# Patient Record
Sex: Female | Born: 1935 | Race: White | Hispanic: No | Smoking: Former smoker
Health system: Southern US, Community
[De-identification: ages and names within clinical notes are randomized; demographics above are authoritative.]

## PROBLEM LIST (undated history)

## (undated) DIAGNOSIS — I619 Nontraumatic intracerebral hemorrhage, unspecified: Secondary | ICD-10-CM

## (undated) DIAGNOSIS — R569 Unspecified convulsions: Secondary | ICD-10-CM

## (undated) HISTORY — PX: VAGINAL HYSTERECTOMY: SHX2639

## (undated) HISTORY — DX: Unspecified convulsions: R56.9

## (undated) HISTORY — DX: Nontraumatic intracerebral hemorrhage, unspecified: I61.9

---

## 2017-11-01 ENCOUNTER — Encounter: Payer: Self-pay | Admitting: Neurology

## 2017-11-01 ENCOUNTER — Telehealth: Payer: Self-pay | Admitting: Neurology

## 2017-11-01 ENCOUNTER — Ambulatory Visit (INDEPENDENT_AMBULATORY_CARE_PROVIDER_SITE_OTHER): Payer: Medicare Other | Admitting: Neurology

## 2017-11-01 VITALS — BP 105/70 | HR 71 | Ht 62.0 in | Wt 101.5 lb

## 2017-11-01 DIAGNOSIS — I611 Nontraumatic intracerebral hemorrhage in hemisphere, cortical: Secondary | ICD-10-CM

## 2017-11-01 DIAGNOSIS — R569 Unspecified convulsions: Secondary | ICD-10-CM | POA: Diagnosis not present

## 2017-11-01 DIAGNOSIS — I679 Cerebrovascular disease, unspecified: Secondary | ICD-10-CM | POA: Diagnosis not present

## 2017-11-01 DIAGNOSIS — I619 Nontraumatic intracerebral hemorrhage, unspecified: Secondary | ICD-10-CM

## 2017-11-01 HISTORY — DX: Unspecified convulsions: R56.9

## 2017-11-01 HISTORY — DX: Nontraumatic intracerebral hemorrhage, unspecified: I61.9

## 2017-11-01 MED ORDER — DONEPEZIL HCL 5 MG PO TABS: 5.0000 mg | ORAL_TABLET | Freq: Every day | ORAL | 1 refills | Status: DC

## 2017-11-01 NOTE — Telephone Encounter (Signed)
A prescription for Aricept was written.  [facility] be [facility].

## 2017-11-01 NOTE — Progress Notes (Signed)
Reason for visit: Cerebrovascular disease, seizure  Referring physician: Dr. Benson Norway is a 84 y.o. female  History of present illness:  Ann Johns is an 84 year old right-handed white female with a history of a right frontal intracranial hemorrhage that was spontaneous in nature on 22 July 2017.  The patient presented to the emergency room after 2 seizure events, the episodes were associated with staring off, and unresponsiveness.  The patient lived in [place], [place] at the time, she went to the emergency room and was found to have the intracranial hemorrhage but also found to have extensive small vessel ischemic changes that were chronic in nature.  The pattern of intracranial hemorrhage was felt to be consistent with amyloid angiopathy, the patient was not on aspirin prior to the hospitalization.  The patient has been placed on Keppra, she has not had any further seizure events.  She had reported some memory problems for about a year and a half prior to the hospitalization, since the intracranial hemorrhage she has had some reports of balance issues, she continues to have some memory problems, she is now in an assisted living facility, she is in [place] with her family.  She still is having daily headaches since the hospitalization, she takes Tylenol for this.  The patient denies any vision changes, she recently has developed some dysphonia, she will be seen by ENT in the near future.  The patient reports no problem with swallowing or choking.  She has no numbness or weakness of the extremities.  She does have a history of tremors, she takes propranolol for this.  She comes to this office for an evaluation.  She has never had a history of hypertension in the past.  No past medical history on file.  Past Surgical History:  Procedure Laterality Date  . VAGINAL HYSTERECTOMY      No family history on file.  Social history:  reports that she has quit smoking. She has never  used smokeless tobacco. She reports that she drank alcohol. She reports that she does not use drugs.  Medications:  Prior to Admission medications   Medication Sig Start Date End Date Taking? Authorizing Provider  Cholecalciferol (VITAMIN D3 PO) Take 5,000 Units by mouth daily.   Yes [provider]  Cyanocobalamin (VITAMIN B-12 PO) Take 1,000 mcg by mouth daily.   Yes [provider]  FOLIC ACID PO Take 1 Dose by mouth daily.   Yes [provider]  levETIRAcetam (KEPPRA) 500 MG tablet Take 500 mg by mouth 2 (two) times daily.   Yes [provider]  Multiple Vitamins-Minerals (CENTRUM PO) Take 1 tablet by mouth daily.   Yes [provider]  propranolol (INDERAL) 20 MG tablet Take 20 mg by mouth daily.   Yes [provider]      Allergies  Allergen Reactions  . Sulfa Antibiotics Rash    ROS:  Out of a complete 14 system review of symptoms, the patient complains only of the following symptoms, and all other reviewed systems are negative.  Confusion, memory problems  Blood pressure 105/70, pulse 71, height 5\' 2"  (1.575 m), weight 101 lb 8 oz (46 kg).  Physical Exam  General: The patient is alert and cooperative at the time of the examination.  Eyes: Pupils are equal, round, and reactive to light. Discs are flat bilaterally.  Neck: The neck is supple, no carotid bruits are noted.  Respiratory: The respiratory examination is clear.  Cardiovascular: The cardiovascular  examination reveals a regular rate and rhythm, no obvious murmurs or rubs are noted.  Skin: Extremities are without significant edema.  Neurologic Exam  Mental status: The patient is alert and oriented x 3 at the time of the examination. The patient has apparent normal recent and remote memory, with an apparently normal attention span and concentration ability.  Cranial nerves: Facial symmetry is present. There is good sensation of the face to pinprick and soft  touch bilaterally. The strength of the facial muscles and the muscles to head turning and shoulder shrug are normal bilaterally. Speech is well enunciated, no aphasia or dysarthria is noted. Extraocular movements are full. Visual fields are full. The tongue is midline, and the patient has symmetric elevation of the soft palate. No obvious hearing deficits are noted.  Motor: The motor testing reveals 5 over 5 strength of all 4 extremities. Good symmetric motor tone is noted throughout.  Sensory: Sensory testing is intact to pinprick, soft touch, vibration sensation, and position sense on all 4 extremities. No evidence of extinction is noted.  Coordination: Cerebellar testing reveals good finger-nose-finger and heel-to-shin bilaterally.  Gait and station: Gait is minimally wide-based, the patient can walk independently but usually uses a walker.. Tandem gait is unsteady. Romberg is negative. No drift is seen.  Reflexes: Deep tendon reflexes are symmetric and normal bilaterally. Toes are downgoing bilaterally.   Assessment/Plan:  1.  Reported memory disturbance  2.  History of right frontal intracranial hemorrhage  3.  Extensive small vessel disease by MRI brain  4.  Mild gait disorder  5.  Seizures following intracranial hemorrhage  The disc of the MRI of the brain was brought for my review, this does show extensive white matter disease that likely is severe enough to result in some problems with balance and memory.  The patient has been taken off of the Aricept because of a runny nose, but she has not noted any change in her runny nose off the medication.  She may restart the medication if desired.  The patient will undergo a carotid Doppler study and 2D echocardiogram, the source of the extensive white matter changes is not clear.  Prior MRA of the head was normal.  The patient will follow-up in 6 months.  In the future, she may be able to come off of her Keppra as the seizures occurred in  the hyper-acute period following the intracranial hemorrhage.  The patient had the original MRI of the brain on 22 July 2017, she had a repeat study of about 1 month later.  Ann Johns. Keith Emmaline Wahba MD 11/01/2017 2:27 PM  [facility] [address] [place], [place] [id number]  Phone [phone] Fax [phone]

## 2017-11-01 NOTE — Addendum Note (Signed)
Addended by: York SpanielWILLIS, CHARLES K on: 11/01/2017 04:44 PM   Modules accepted: Orders

## 2017-11-01 NOTE — Telephone Encounter (Signed)
Patient's daughter-in-law Ann Johns requesting new Rx for Aricept [facility] fax[facility] at [phone]. Patient was seen today by Dr. Anne HahnWillis. A returned call is not needed.

## 2017-11-10 ENCOUNTER — Other Ambulatory Visit: Payer: Self-pay | Admitting: Otolaryngology

## 2017-11-10 DIAGNOSIS — J3801 Paralysis of vocal cords and larynx, unilateral: Secondary | ICD-10-CM

## 2017-11-12 ENCOUNTER — Other Ambulatory Visit: Payer: Self-pay

## 2017-11-12 ENCOUNTER — Telehealth: Payer: Self-pay | Admitting: Neurology

## 2017-11-12 ENCOUNTER — Ambulatory Visit (HOSPITAL_COMMUNITY): Payer: Medicare Other | Attending: Internal Medicine

## 2017-11-12 DIAGNOSIS — I081 Rheumatic disorders of both mitral and tricuspid valves: Secondary | ICD-10-CM | POA: Insufficient documentation

## 2017-11-12 DIAGNOSIS — I119 Hypertensive heart disease without heart failure: Secondary | ICD-10-CM | POA: Diagnosis not present

## 2017-11-12 DIAGNOSIS — I679 Cerebrovascular disease, unspecified: Secondary | ICD-10-CM

## 2017-11-12 DIAGNOSIS — I639 Cerebral infarction, unspecified: Secondary | ICD-10-CM | POA: Diagnosis present

## 2017-11-12 NOTE — Telephone Encounter (Signed)
I called the patient, talk with the daughter.  The patient has a normal ejection fraction of the heart, mild mitral regurgitation, some septal hypertrophy but no left ventricular outlet obstruction.  The carotid Doppler study is pending.    2D echo 11/12/17:  Study Conclusions  - Left ventricle: The cavity size was normal. There was severe focal basal hypertrophy of the septum. No LVOT obstruction. Systolic function was normal. The estimated ejection fraction was in the range of 55% to 60%. Doppler parameters are consistent with abnormal left ventricular relaxation (grade 1 diastolic dysfunction). The E/e&' ratio is bewteen 8-15, suggesting indeterminate LV filling pressure. - Mitral valve: Mildly thickened leaflets . There was mild regurgitation. - Left atrium: The atrium was normal in size. - Tricuspid valve: There was mild regurgitation. - Pulmonary arteries: PA peak pressure: 26 mm Hg (S) + RAP. - Systemic veins: Not visualized.  Impressions:  - LVEF 55-60%, severe focal basal septal hypertrophy without LVOT obstruction, normal wall motion, grade 1 DD, indeterminate LV filling pressure, mild MR, normal LA size, mild TR, RVSP 26 mmHg + RAP, IVC was not visualized.

## 2017-11-18 ENCOUNTER — Ambulatory Visit
Admission: RE | Admit: 2017-11-18 | Discharge: 2017-11-18 | Disposition: A | Discharge status: Home / Self Care | Payer: Medicare Other | Source: Ambulatory Visit | Attending: Otolaryngology | Admitting: Otolaryngology

## 2017-11-18 DIAGNOSIS — J3801 Paralysis of vocal cords and larynx, unilateral: Secondary | ICD-10-CM

## 2017-11-18 IMAGING — CT CT NECK W/ CM
2 of 4 series · 6 of 14 positions shown, 7 images · IV contrast (iopamidol)
Comparison: None.

CLINICAL DATA: 84-year-old female with paralysis of the left vocal
cord. Patient reports some improvement in her voice recently.
History of skin cancer.

Creatinine was obtained on site at [HOSPITAL] at [HOSPITAL].
Results: Creatinine 0.3 mg/dL.
EXAM:
CT NECK WITH CONTRAST
TECHNIQUE: Multidetector CT imaging of the neck was performed using the
standard protocol following the bolus administration of intravenous
contrast.
CONTRAST:  75mL [SP] IOPAMIDOL ([SP]) INJECTION 61%

[Series 3: neck · axial · 0.44mm/px · z∈[-255,-127]mm · 3 of 130 slices shown]
[im 33/130  bone]
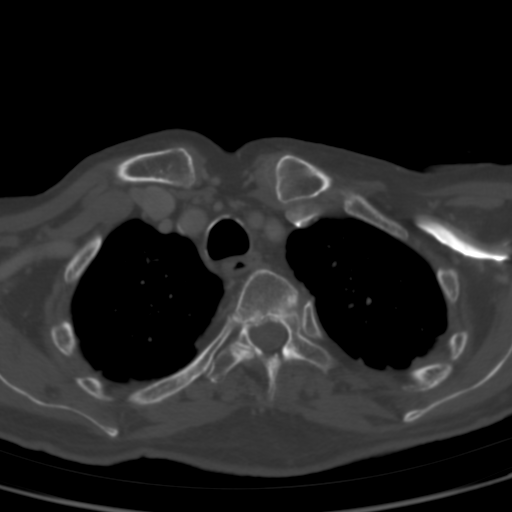
[im 65/130  bone]
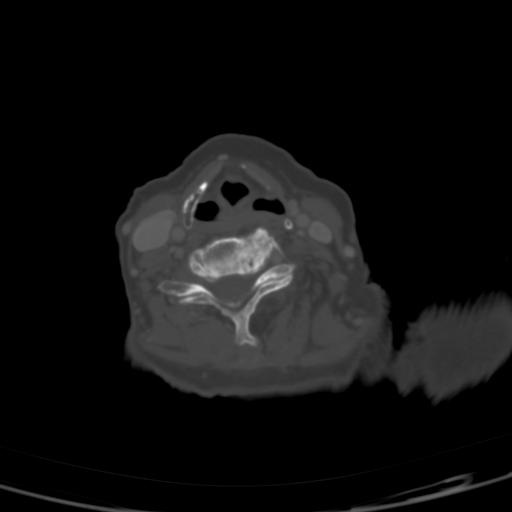
[im 97/130  bone]
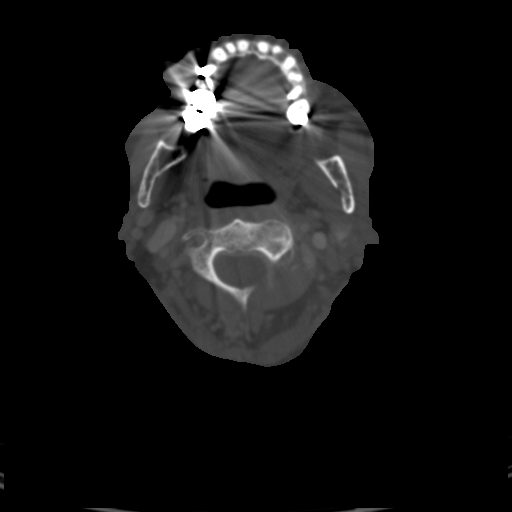

[Series 8: angled axial-oropharynx · axial · 0.39mm/px · z∈[-271,-145]mm · 3 of 130 slices shown, 4 images]
[im 33/130  soft-tissue]
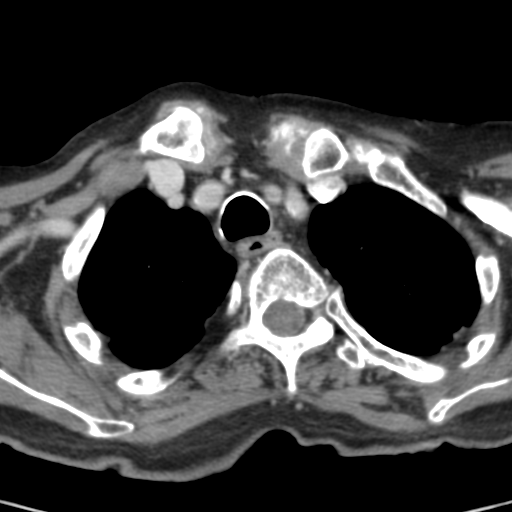
[im 33/130  bone]
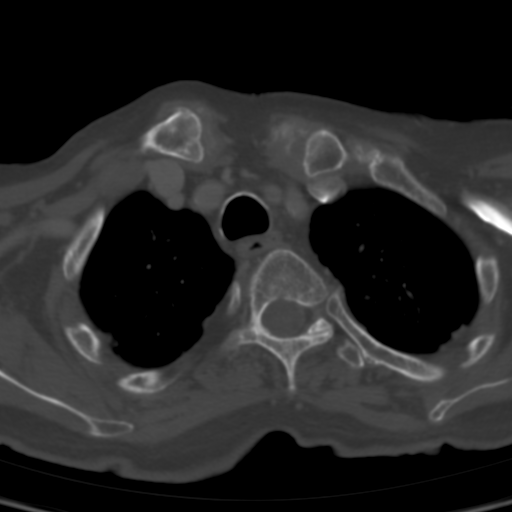
[im 65/130  bone]
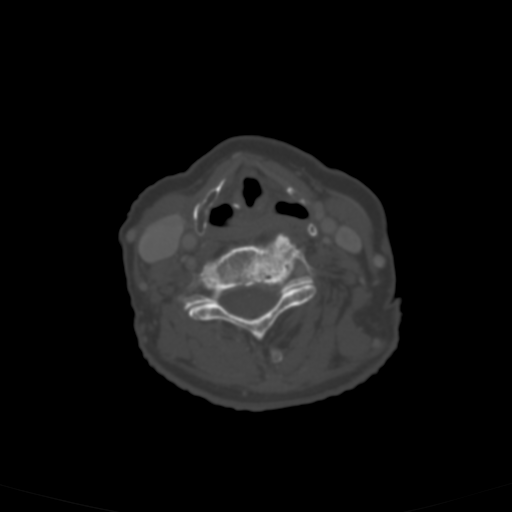
[im 97/130  bone]
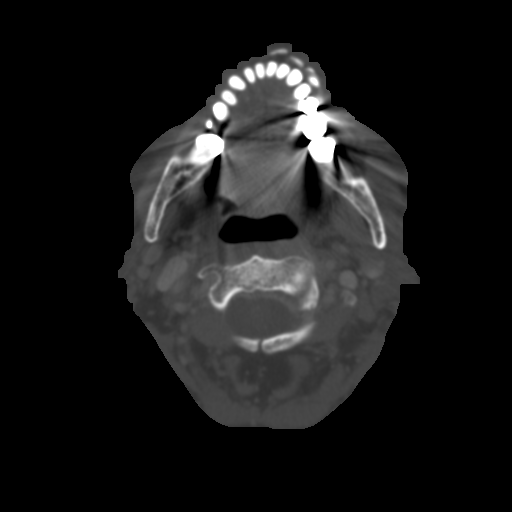

[6 of 14 positions shown; findings below may reference images not displayed]

FINDINGS: Pharynx and larynx: There is mild asymmetry of the larynx including
medial deviation of the left vocal fold and slight asymmetric
enlargement of the left laryngeal ventricle (series 3, image 73).
The piriform sinuses are symmetric. The supraglottic larynx appears
normal. No abnormal enhancement or laryngeal mass is identified.

Pharyngeal soft tissue contours are normal. Negative parapharyngeal
and retropharyngeal spaces.

Salivary glands: Negative sublingual space. The submandibular glands
appear symmetric and within normal limits, both superior
submandibular poles are mildly lobulated. The parotid glands appear
symmetric and within normal limits.

Thyroid: Diminutive, negative.

Lymph nodes: No cervical lymphadenopathy. The largest lymph node is
at the left level II a station measuring 5-6 millimeter short axis
on series 3, image 45. No heterogeneous or cystic lymph nodes.

Vascular: The major vascular structures in the neck and at the skull
base are patent. The left carotid space is negative aside from bulky
calcified plaque of the proximal left ICA. Still, stenosis there may
not be hemodynamically significant. Similar but less pronounced
contralateral right ICA origin calcified plaque. Bilateral ICA
siphon calcified plaque.

Limited intracranial: Negative visualized brain parenchyma.

Visualized orbits: Negative.

Mastoids and visualized paranasal sinuses: Trace maxillary alveolar
recess mucosal thickening, otherwise clear.

Skeleton: Osteopenia. Advanced cervical spine degeneration, severe
at the C4 through C6 levels with degenerative vertebral body
sclerosis. No acute or suspicious osseous lesion. Partially visible
thoracic scoliosis.

Upper chest: Calcified aortic atherosclerosis. Normal visible
mediastinum including the left AP window which is completely
visible. No mediastinal lymphadenopathy.

Mild apical lung scarring.  Visible axillary lymph nodes are normal.
IMPRESSION: 1. Asymmetry of the larynx compatible with left vocal cord
paralysis, but no causative lesion or laryngeal mass is identified.
2. Negative Neck CT otherwise;
- Aortic Atherosclerosis ([SP]-[SP]) and calcified bilateral ICA
atherosclerosis, greater on the left.
- advanced cervical spine degeneration.

## 2017-11-18 MED ORDER — IOPAMIDOL (ISOVUE-300) INJECTION 61%
75.0000 mL | Freq: Once | INTRAVENOUS | Status: AC | PRN
  Administered 2017-11-18: 75 mL via INTRAVENOUS

## 2017-12-07 ENCOUNTER — Ambulatory Visit (HOSPITAL_COMMUNITY)
Admission: RE | Admit: 2017-12-07 | Discharge: 2017-12-07 | Disposition: A | Discharge status: Home / Self Care | Payer: Medicare Other | Source: Ambulatory Visit | Attending: Neurology | Admitting: Neurology

## 2017-12-07 DIAGNOSIS — I679 Cerebrovascular disease, unspecified: Secondary | ICD-10-CM | POA: Diagnosis not present

## 2017-12-07 NOTE — Progress Notes (Signed)
*  Preliminary Results* Carotid artery duplex has been completed. Bilateral internal carotid arteries are 1-39%. Vertebral arteries are patent with antegrade flow.  12/07/2017 10:39 AM  Aundra Millet Clare Gandy

## 2017-12-08 ENCOUNTER — Telehealth: Payer: Self-pay | Admitting: Neurology

## 2017-12-08 NOTE — Telephone Encounter (Signed)
I called the patient. The carotid doppler and the 2D echo are relatively unremarkable, no source of the extensive WM changes are noted.   Carotid doppler 12/08/17:  Summary: Right Carotid: Velocities in the right ICA are consistent with a 1-39% stenosis.  Left Carotid: Velocities in the left ICA are consistent with a 1-39% stenosis.  Vertebrals: Bilateral vertebral arteries demonstrate antegrade flow.   2D echo 11/12/17:  Study Conclusions  - Left ventricle: The cavity size was normal. There was severe   focal basal hypertrophy of the septum. No LVOT obstruction.   Systolic function was normal. The estimated ejection fraction was   in the range of 55% to 60%. Doppler parameters are consistent   with abnormal left ventricular relaxation (grade 1 diastolic   dysfunction). The E/e&' ratio is bewteen 8-15, suggesting   indeterminate LV filling pressure. - Mitral valve: Mildly thickened leaflets . There was mild   regurgitation. - Left atrium: The atrium was normal in size. - Tricuspid valve: There was mild regurgitation. - Pulmonary arteries: PA peak pressure: 26 mm Hg (S) + RAP. - Systemic veins: Not visualized.  Impressions:  - LVEF 55-60%, severe focal basal septal hypertrophy without LVOT   obstruction, normal wall motion, grade 1 DD, indeterminate LV   filling pressure, mild MR, normal LA size, mild TR, RVSP 26 mmHg   + RAP, IVC was not visualized.

## 2018-05-24 ENCOUNTER — Telehealth: Payer: Self-pay

## 2018-05-24 NOTE — Telephone Encounter (Signed)
I spoke with Judeth Cornfield, patient's daughter in law on dpr, and she states that she is still waiting on the nursing home to call her back to see if anyone there would be able to set up a virtual visit for the patient, she will call us back as soon as she hears something.

## 2018-05-25 ENCOUNTER — Ambulatory Visit: Payer: Medicare Other | Admitting: Neurology

## 2018-05-25 NOTE — Telephone Encounter (Signed)
I contacted the patient's daughter in law stephanie in regards to the pt's 3:30 appt. She states she is unable to confirm if the pt has access to a video capable device at this time but states she will call back when she can and let us know.  I advised I would remove the pt from the schedule for today and we could readdress once video capability has been determined.

## 2018-12-24 ENCOUNTER — Encounter (HOSPITAL_COMMUNITY): Payer: Self-pay | Admitting: Emergency Medicine

## 2018-12-24 ENCOUNTER — Emergency Department (HOSPITAL_COMMUNITY): Payer: Medicare Other

## 2018-12-24 ENCOUNTER — Emergency Department (HOSPITAL_COMMUNITY)
Admission: EM | Admit: 2018-12-24 | Discharge: 2018-12-24 | Disposition: A | Discharge status: Home / Self Care | Payer: Medicare Other | Attending: Emergency Medicine | Admitting: Emergency Medicine

## 2018-12-24 ENCOUNTER — Other Ambulatory Visit: Payer: Self-pay

## 2018-12-24 DIAGNOSIS — Z79899 Other long term (current) drug therapy: Secondary | ICD-10-CM | POA: Insufficient documentation

## 2018-12-24 DIAGNOSIS — Y93K1 Activity, walking an animal: Secondary | ICD-10-CM | POA: Insufficient documentation

## 2018-12-24 DIAGNOSIS — S62354A Nondisplaced fracture of shaft of fourth metacarpal bone, right hand, initial encounter for closed fracture: Secondary | ICD-10-CM | POA: Diagnosis not present

## 2018-12-24 DIAGNOSIS — S6991XA Unspecified injury of right wrist, hand and finger(s), initial encounter: Secondary | ICD-10-CM | POA: Insufficient documentation

## 2018-12-24 DIAGNOSIS — Z23 Encounter for immunization: Secondary | ICD-10-CM | POA: Insufficient documentation

## 2018-12-24 DIAGNOSIS — W01198A Fall on same level from slipping, tripping and stumbling with subsequent striking against other object, initial encounter: Secondary | ICD-10-CM | POA: Diagnosis not present

## 2018-12-24 DIAGNOSIS — S0181XA Laceration without foreign body of other part of head, initial encounter: Secondary | ICD-10-CM | POA: Diagnosis not present

## 2018-12-24 DIAGNOSIS — Y92480 Sidewalk as the place of occurrence of the external cause: Secondary | ICD-10-CM | POA: Diagnosis not present

## 2018-12-24 DIAGNOSIS — S62304A Unspecified fracture of fourth metacarpal bone, right hand, initial encounter for closed fracture: Secondary | ICD-10-CM

## 2018-12-24 DIAGNOSIS — S0990XA Unspecified injury of head, initial encounter: Secondary | ICD-10-CM | POA: Diagnosis present

## 2018-12-24 DIAGNOSIS — Y999 Unspecified external cause status: Secondary | ICD-10-CM | POA: Insufficient documentation

## 2018-12-24 DIAGNOSIS — Z87891 Personal history of nicotine dependence: Secondary | ICD-10-CM | POA: Diagnosis not present

## 2018-12-24 IMAGING — CT CT MAXILLOFACIAL W/O CM
3 series · 16 of 47 positions shown, 19 images · non-contrast
Comparison: None.

CLINICAL DATA: Fall, facial laceration

EXAM:
CT MAXILLOFACIAL WITHOUT CONTRAST
TECHNIQUE: Multidetector CT imaging of the maxillofacial structures was
performed. Multiplanar CT image reconstructions were also generated.

[Series 3: max soft · axial · 0.33mm/px · z∈[-213,-69]mm · 10 of 84 slices shown, 13 images]
[im 6/84  brain]
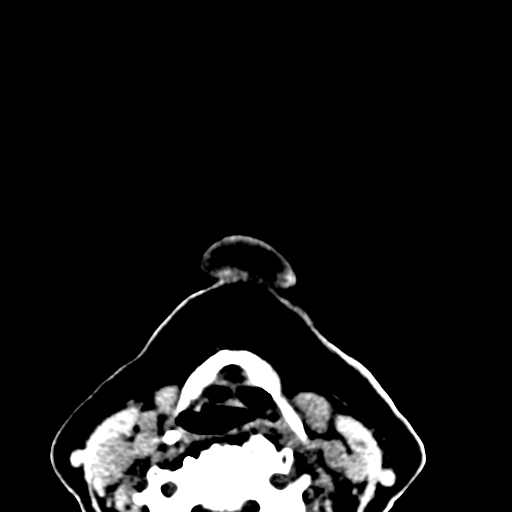
[im 6/84  bone]
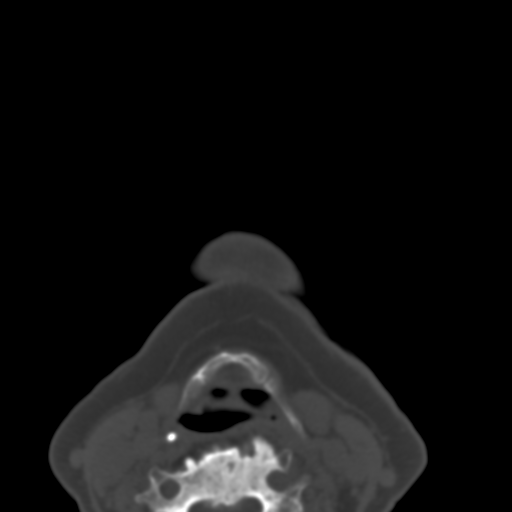
[im 15/84  bone]
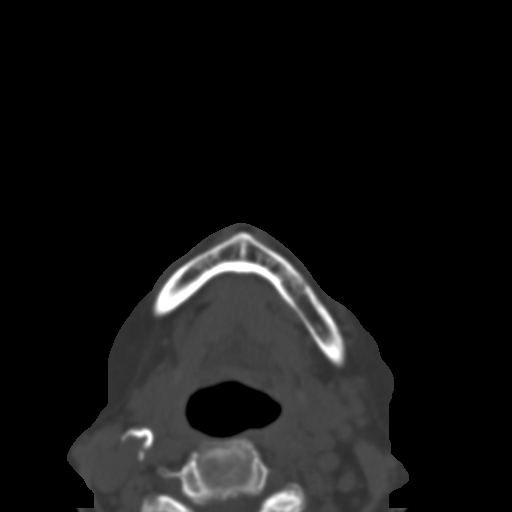
[im 23/84  bone]
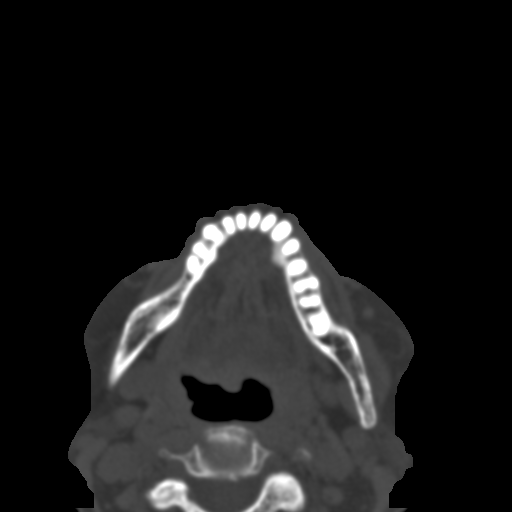
[im 29/84  bone]
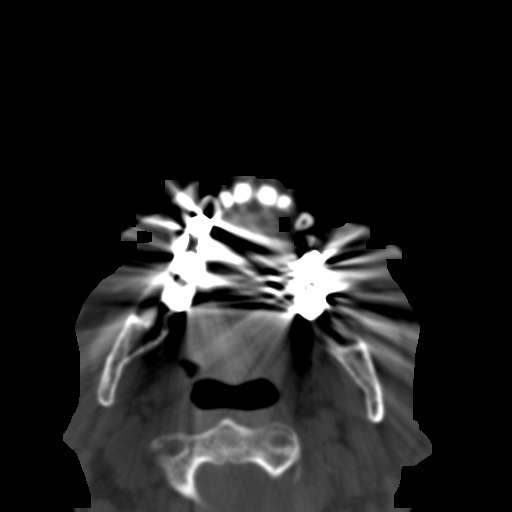
[im 38/84  brain]
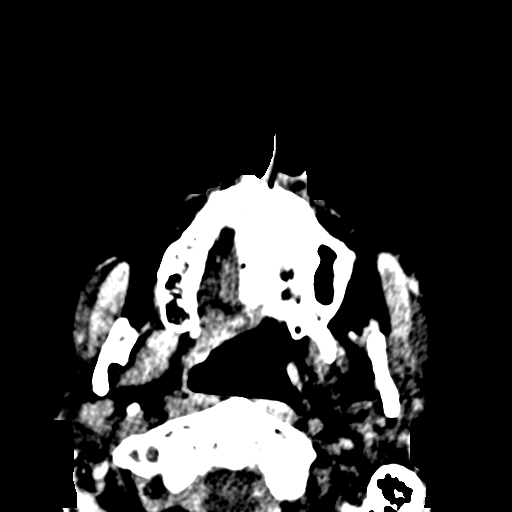
[im 38/84  bone]
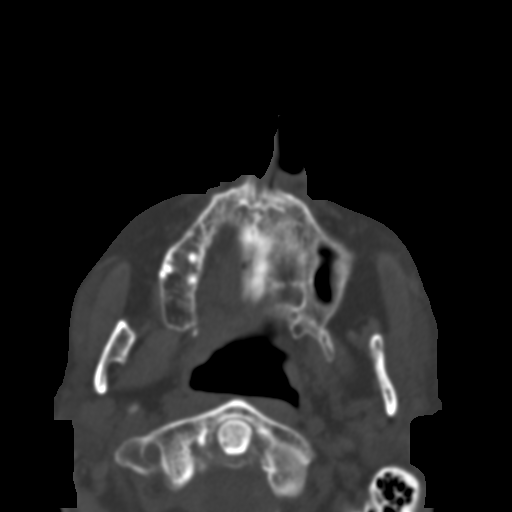
[im 46/84  bone]
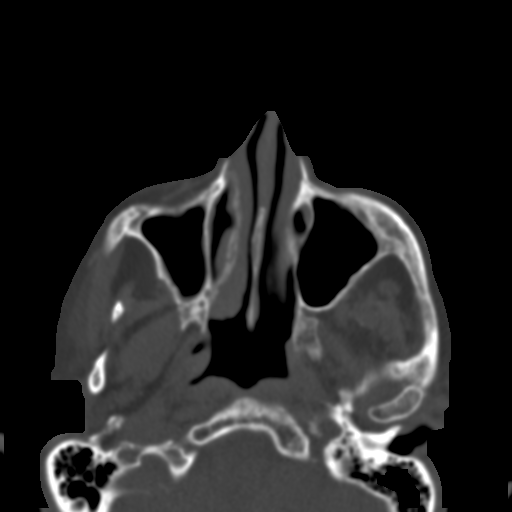
[im 55/84  bone]
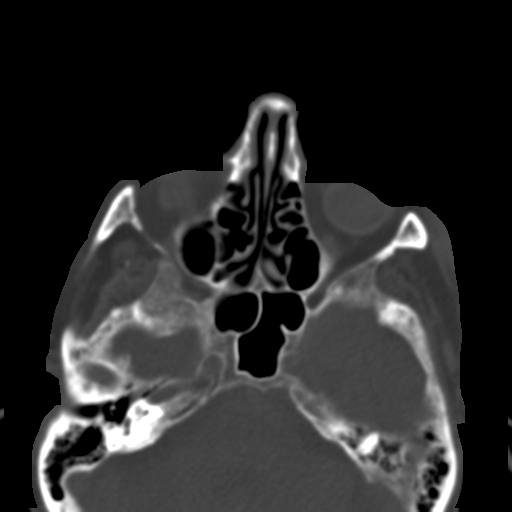
[im 63/84  bone]
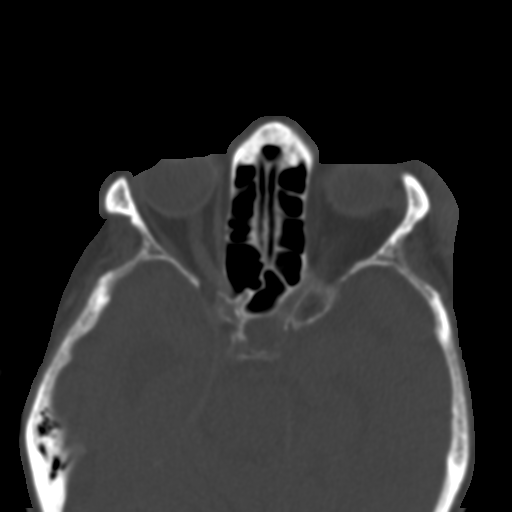
[im 69/84  brain]
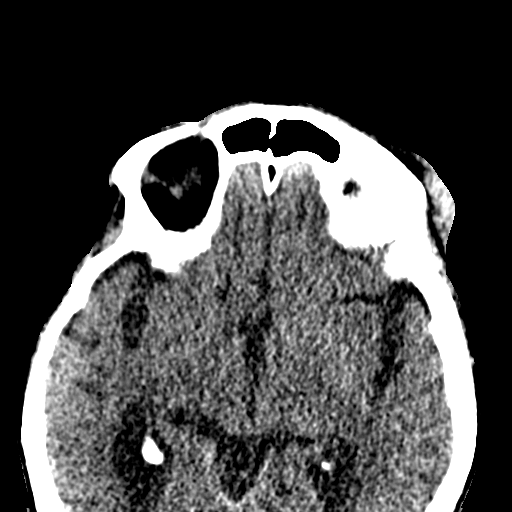
[im 69/84  bone]
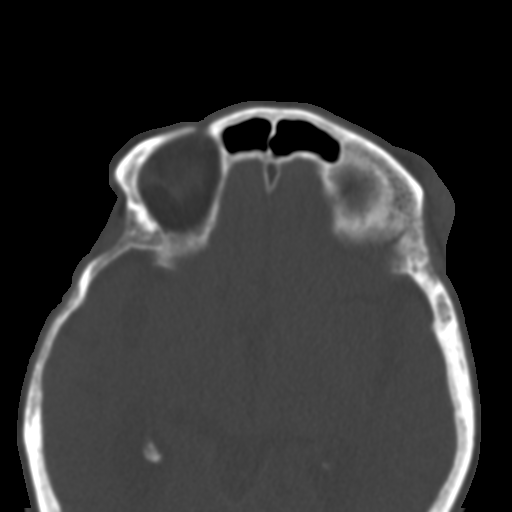
[im 78/84  bone]
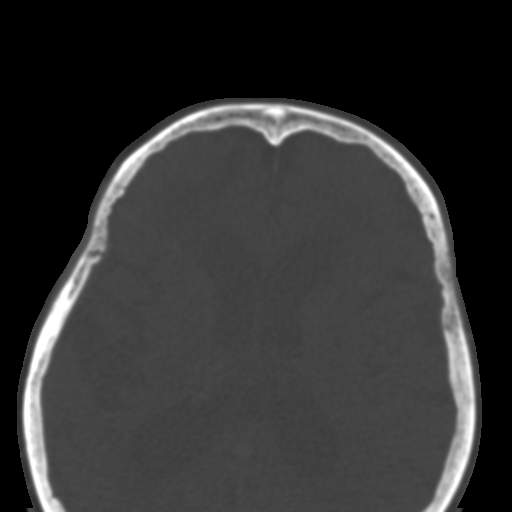

[Series 7: coronal soft · coronal · 0.33mm/px · 3 of 67 slices shown]
[im 23/67  bone]
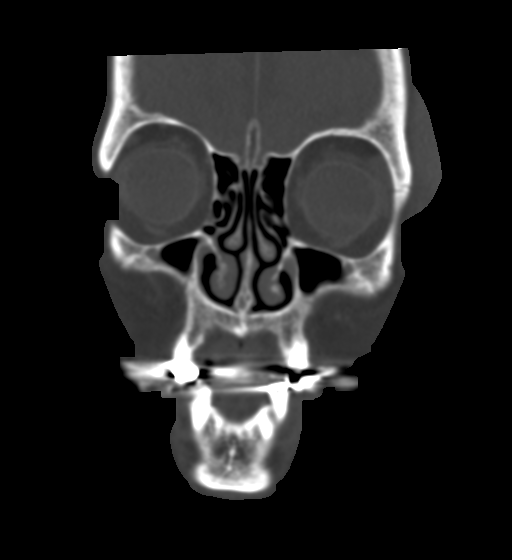
[im 30/67  bone]
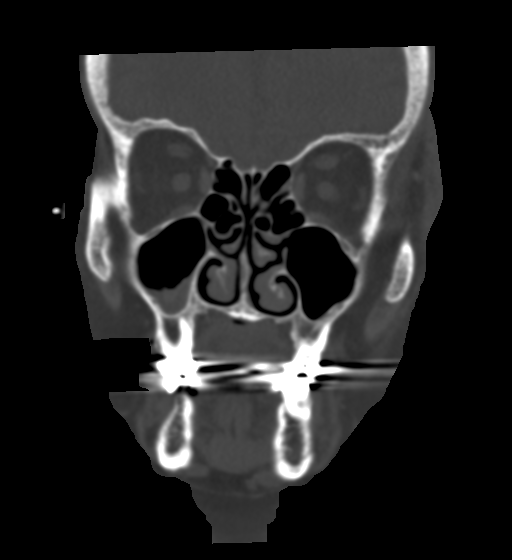
[im 37/67  bone]
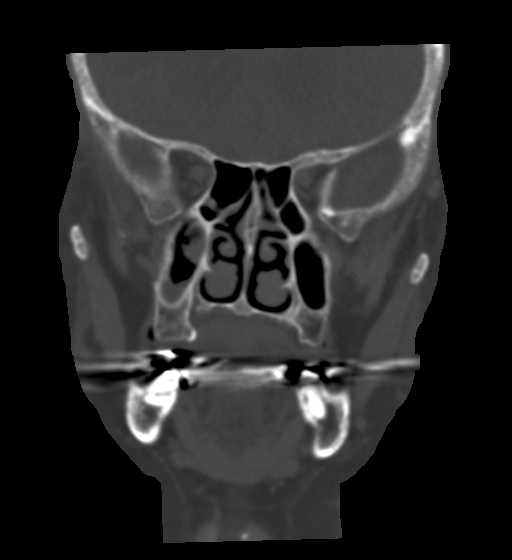

[Series 8: sagittal soft · sagittal · 0.27mm/px · 3 of 87 slices shown]
[im 29/87  bone]
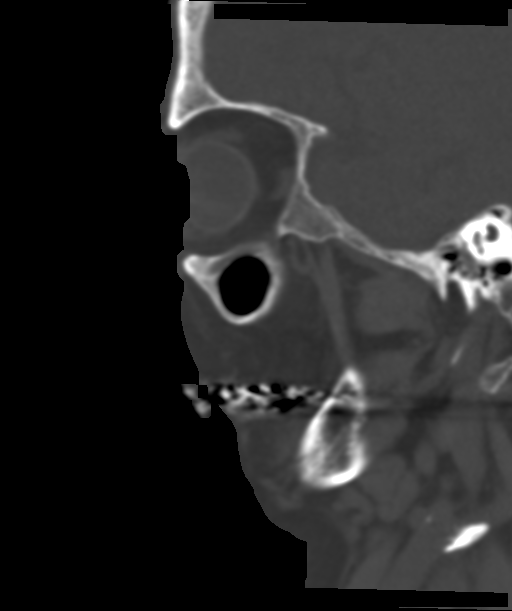
[im 44/87  bone]
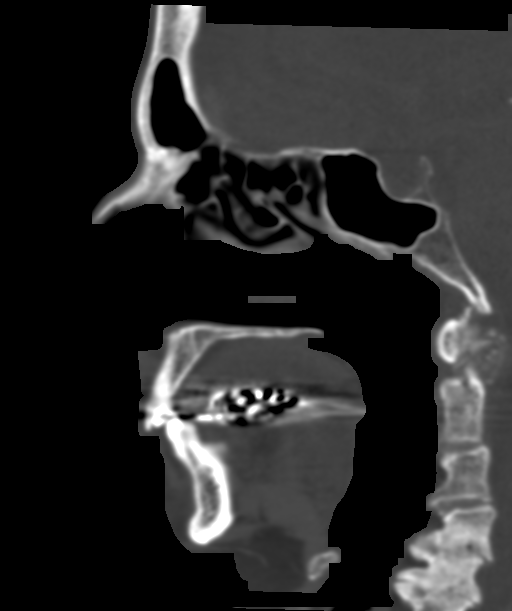
[im 58/87  bone]
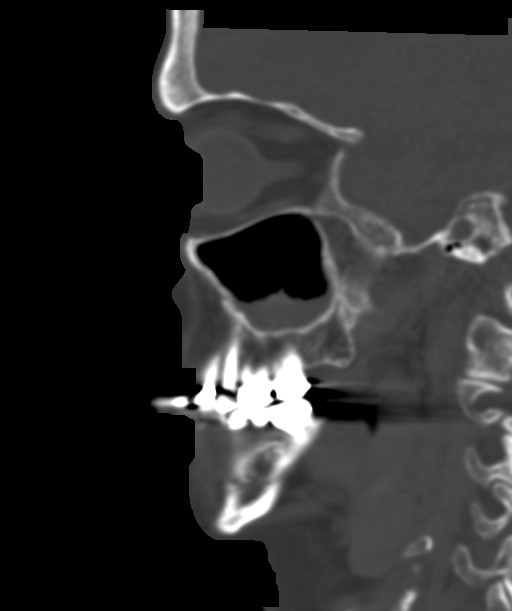

[16 of 47 positions shown; findings below may reference images not displayed]

FINDINGS: Osseous: No fracture or mandibular dislocation. No destructive
process.

Orbits: Negative. No traumatic or inflammatory finding.

Sinuses: Mucosal thickening and bony sinus wall thickening of the
right maxillary sinus, likely sequelae of chronic sinusitis.

Soft tissues: Extensive soft tissue contusion and hematoma about the
left orbit and forehead.

Limited intracranial: No significant or unexpected finding.
IMPRESSION: 1.  No displaced fracture or dislocation of the facial bones.

2. Extensive soft tissue contusion and hematoma about the left orbit
and forehead.

## 2018-12-24 IMAGING — CT CT CERVICAL SPINE W/O CM
3 of 4 series · 11 of 33 positions shown, 13 images · non-contrast
Comparison: None.

CLINICAL DATA: Fall, pain

EXAM:
CT HEAD WITHOUT CONTRAST
CT CERVICAL SPINE WITHOUT CONTRAST
TECHNIQUE: Multidetector CT imaging of the head and cervical spine was
performed following the standard protocol without intravenous
contrast. Multiplanar CT image reconstructions of the cervical spine
were also generated.

[Series 6: orthogonal bone · axial · 0.23mm/px · z∈[-294,-169]mm · 3 of 95 slices shown, 4 images]
[im 16/95  soft-tissue]
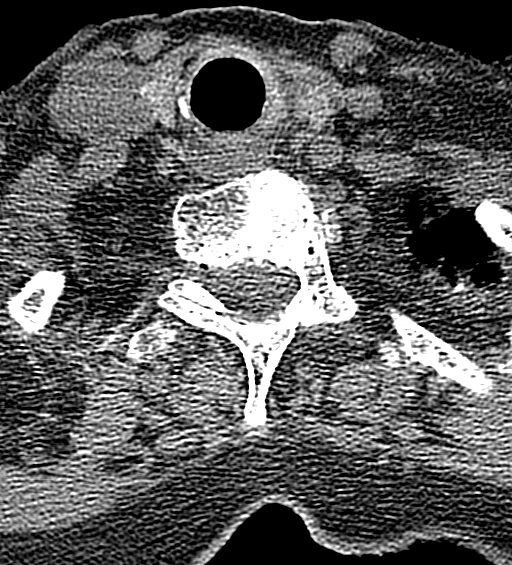
[im 16/95  bone]
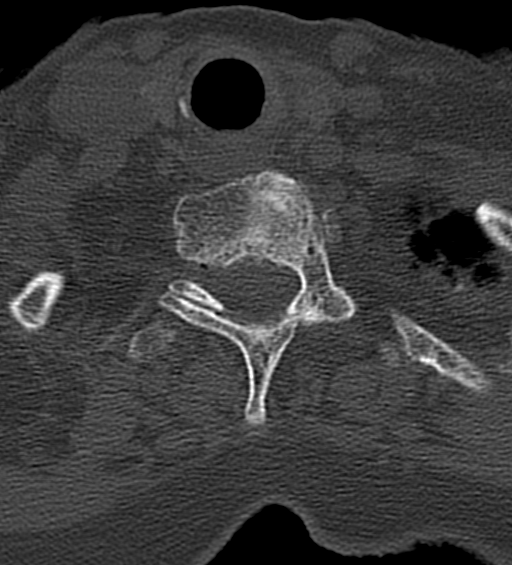
[im 48/95  bone]
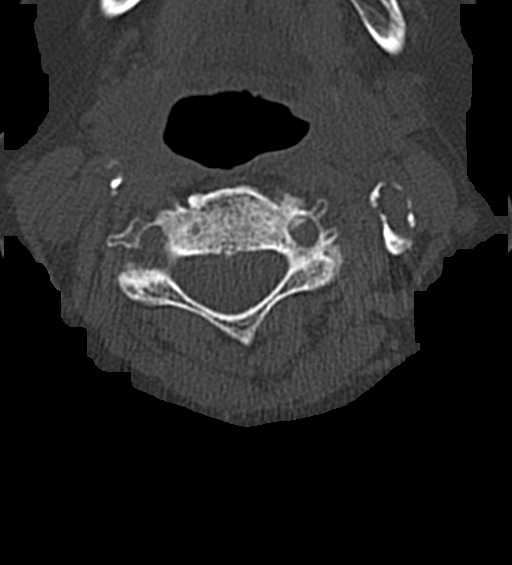
[im 79/95  bone]
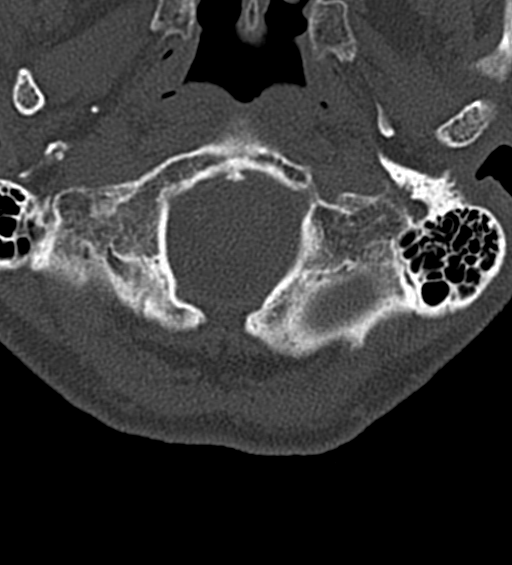

[Series 7: coronal bone · coronal · 0.23mm/px · 3 of 59 slices shown]
[im 12/59  bone]
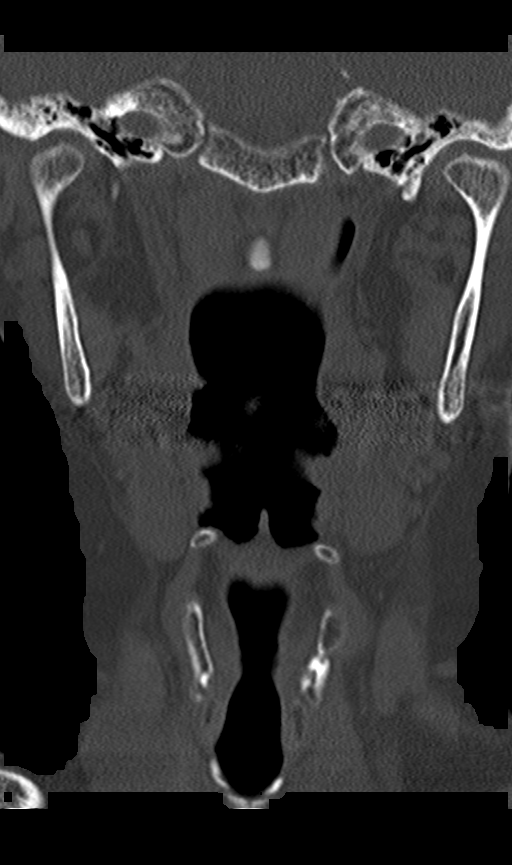
[im 24/59  bone]
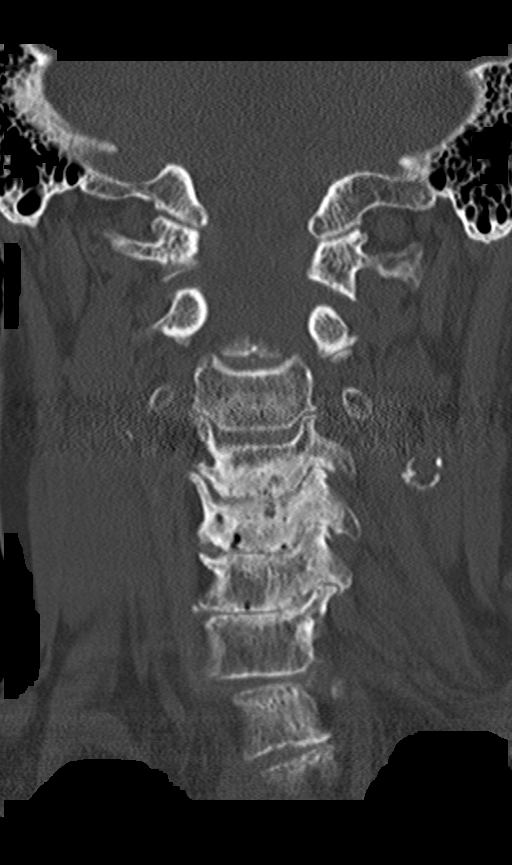
[im 35/59  bone]
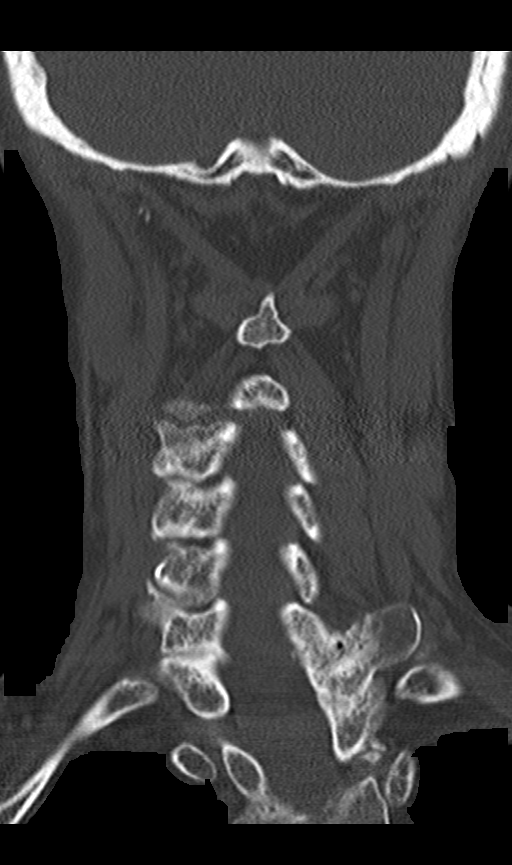

[Series 8: sagittal bone · sagittal · 0.23mm/px · 5 of 61 slices shown, 6 images]
[im 21/61  bone]
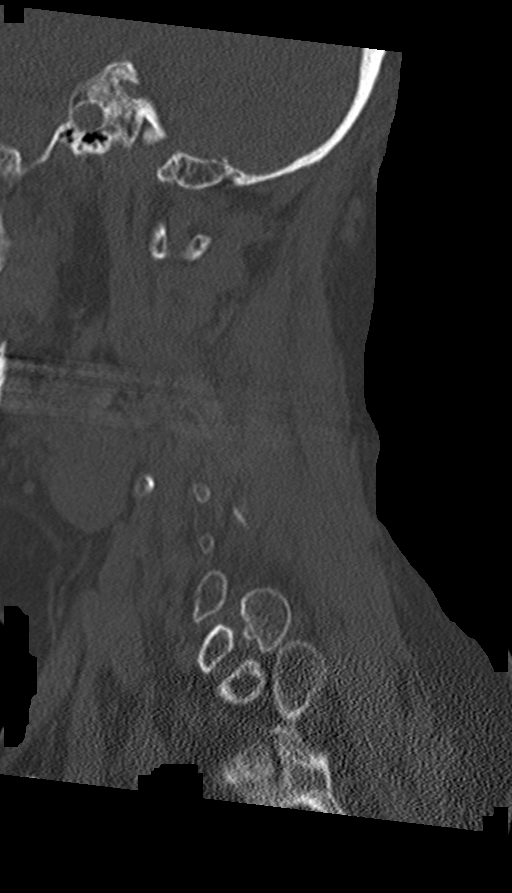
[im 26/61  bone]
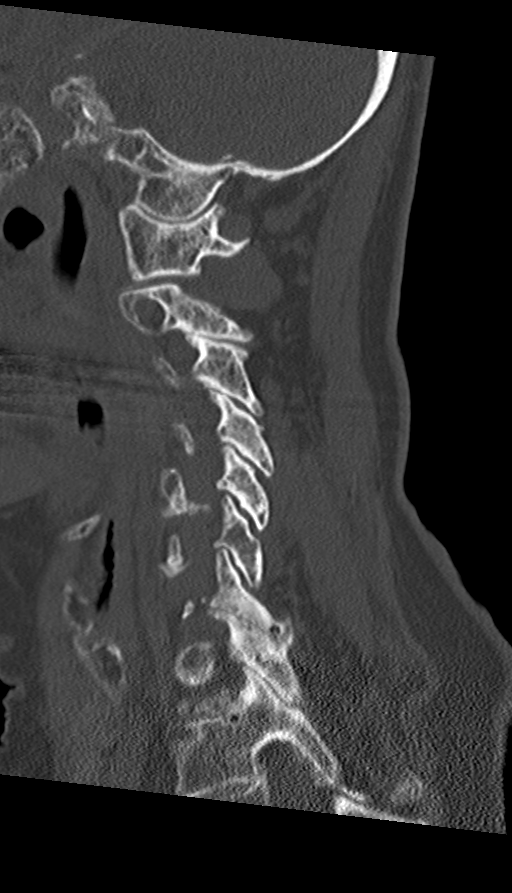
[im 31/61  soft-tissue]
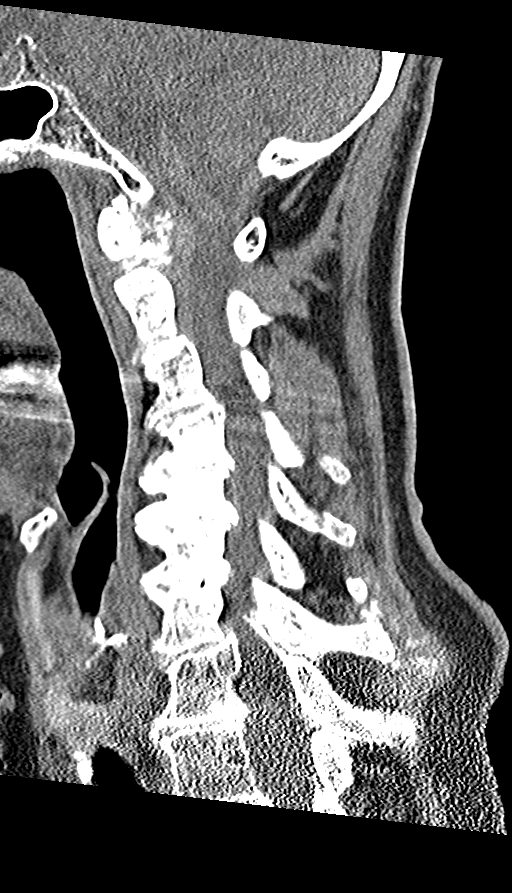
[im 31/61  bone]
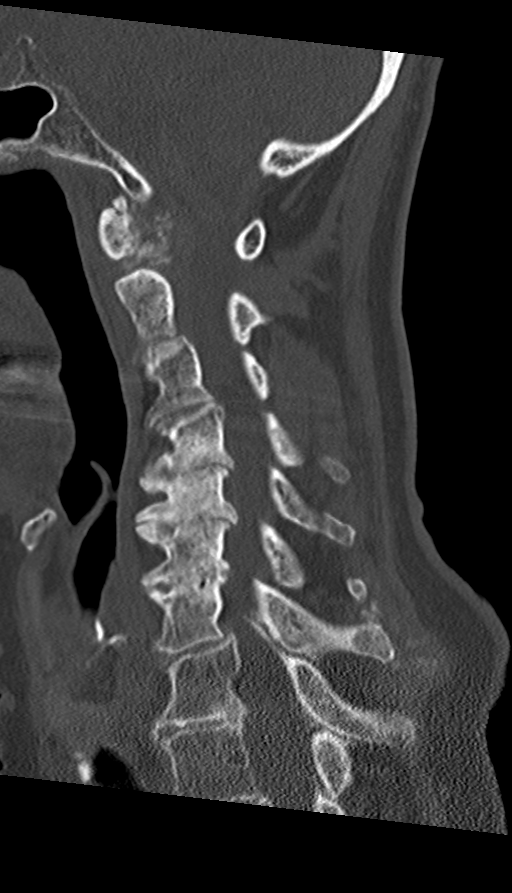
[im 36/61  bone]
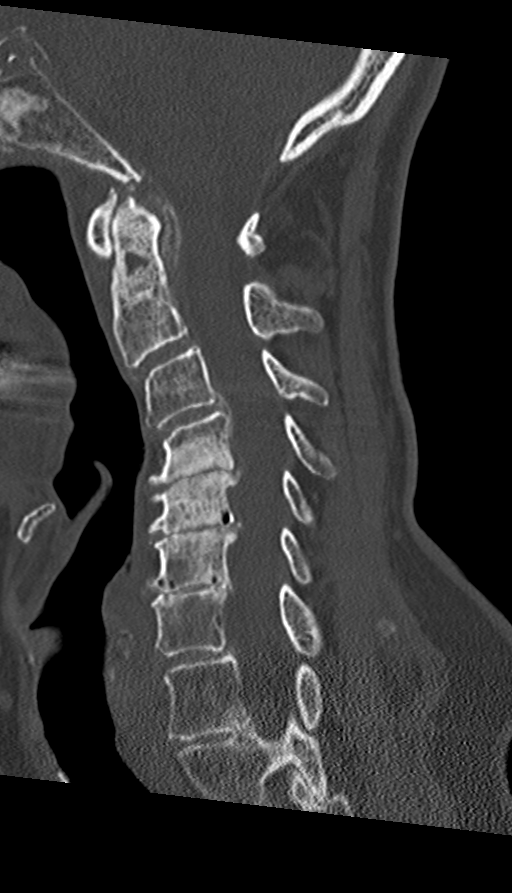
[im 41/61  bone]
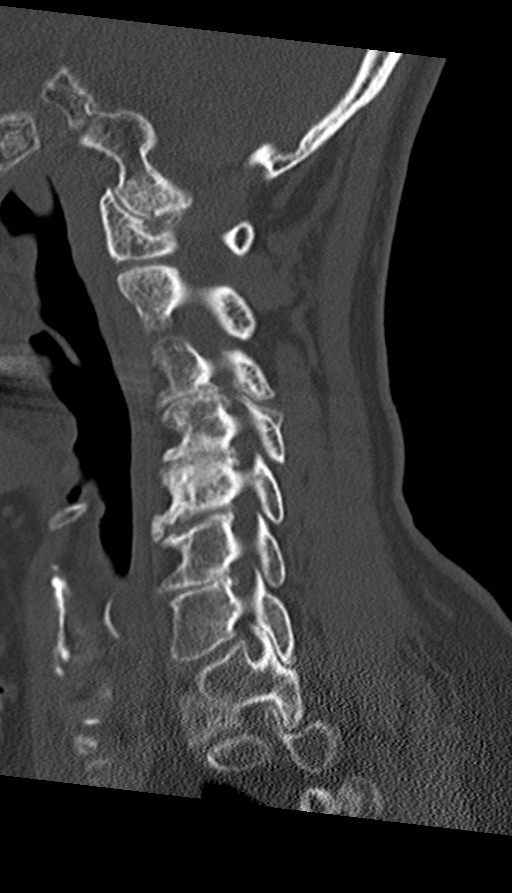

[11 of 33 positions shown; findings below may reference images not displayed]

FINDINGS: CT HEAD FINDINGS

Brain: No evidence of acute infarction, hemorrhage, hydrocephalus,
extra-axial collection or mass lesion/mass effect. Extensive
periventricular and deep white matter hypodensity. Encephalomalacia
of the anterior right frontal lobe (series 3, image 13).

Vascular: No hyperdense vessel or unexpected calcification.

Skull: Normal. Negative for fracture or focal lesion.

Sinuses/Orbits: No acute finding.

Other: Soft tissue edema of the left forehead and orbit.

CT CERVICAL SPINE FINDINGS

Alignment: Degenerative straightening and reversal of the normal
cervical lordosis.

Skull base and vertebrae: No acute fracture. No primary bone lesion
or focal pathologic process.

Soft tissues and spinal canal: No prevertebral fluid or swelling. No
visible canal hematoma.

Disc levels: Severe multilevel disc space height loss and
osteophytosis, worst from C4 through C7.

Upper chest: Negative.

Other: None.
IMPRESSION: 1.  No acute intracranial pathology.

2. Extensive small-vessel white matter disease. Encephalomalacia of
the anterior right frontal lobe (series 3, image 13), in keeping
with prior infarction.

3. No fracture or static subluxation of the cervical spine. Severe
multilevel disc space height loss and osteophytosis.

## 2018-12-24 IMAGING — CR DG WRIST COMPLETE 3+V*R*
4 series · 4 of 4 positions shown · non-contrast
Comparison: None.

CLINICAL DATA: Right wrist pain, s/p fall

EXAM:
RIGHT WRIST - COMPLETE 3+ VIEW

[x wrist pa right]
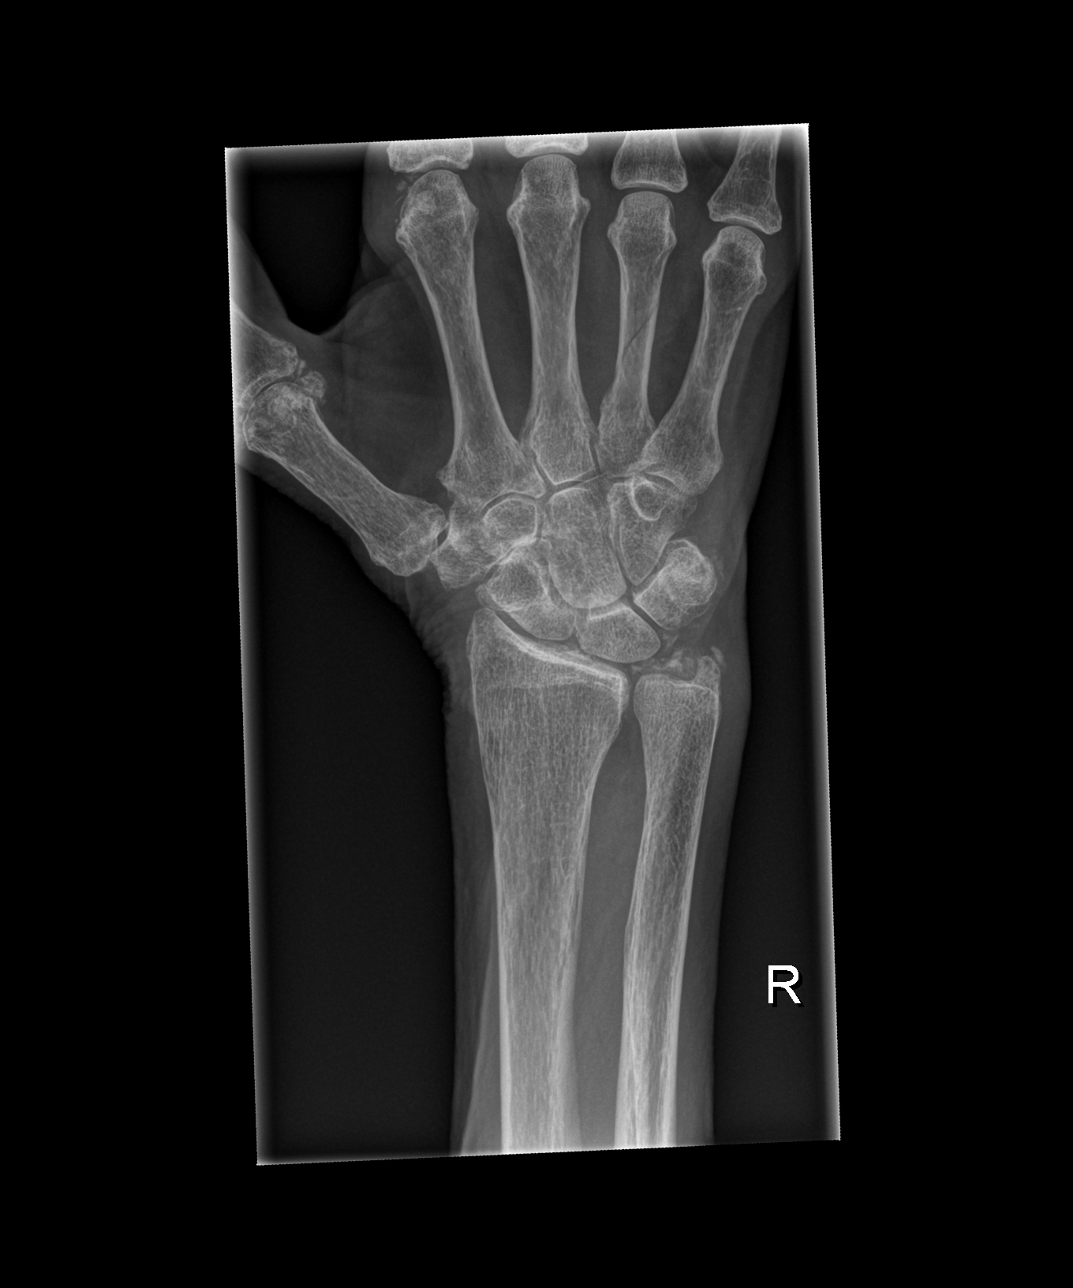

[x wrist obl right]
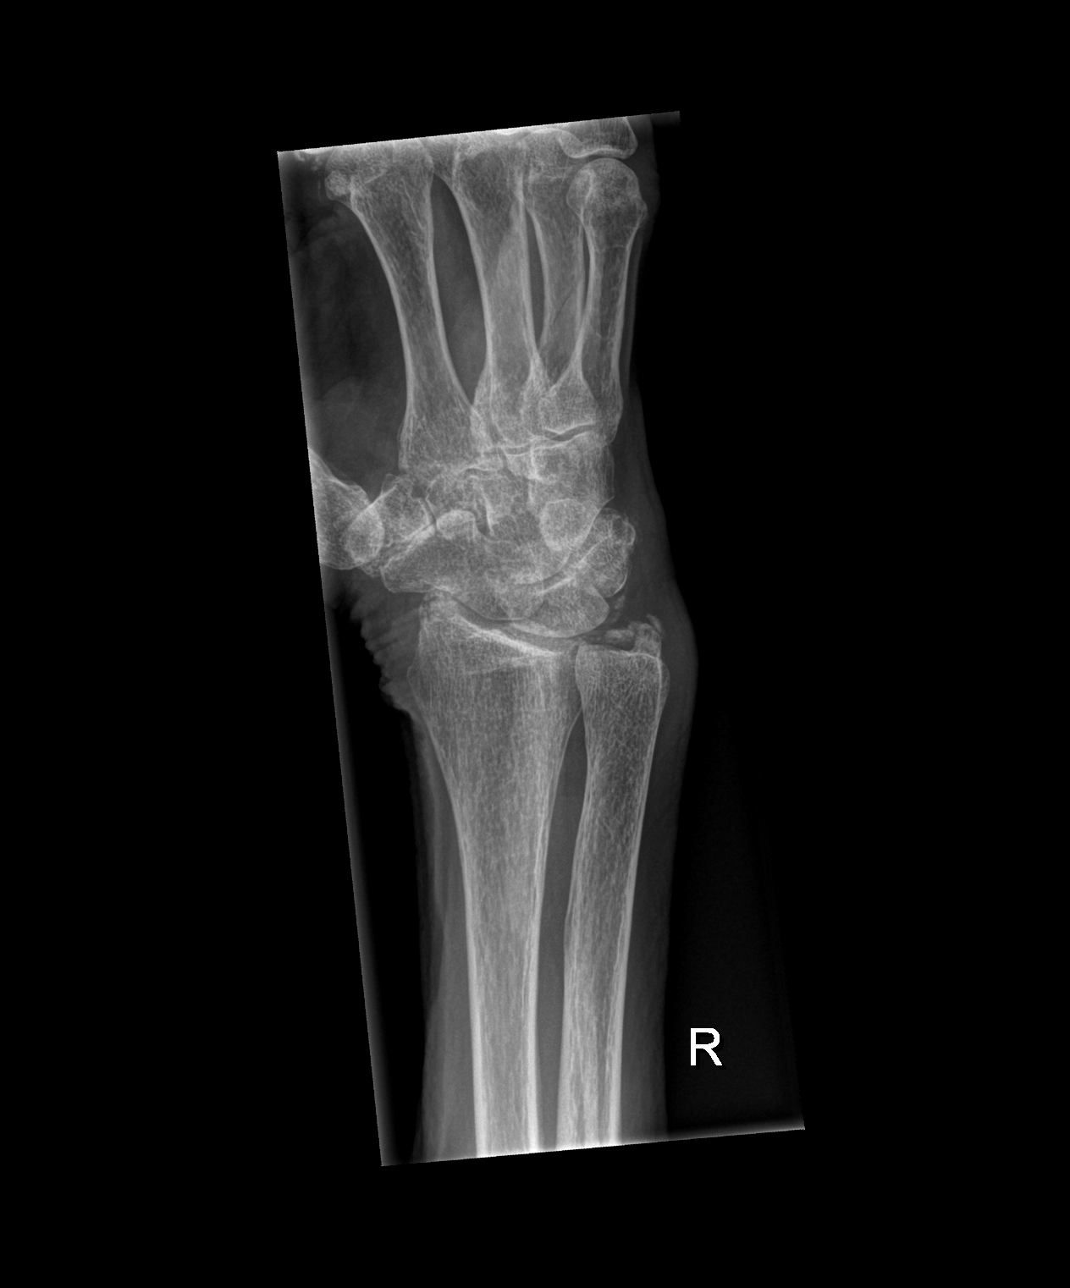

[x wrist lat right]
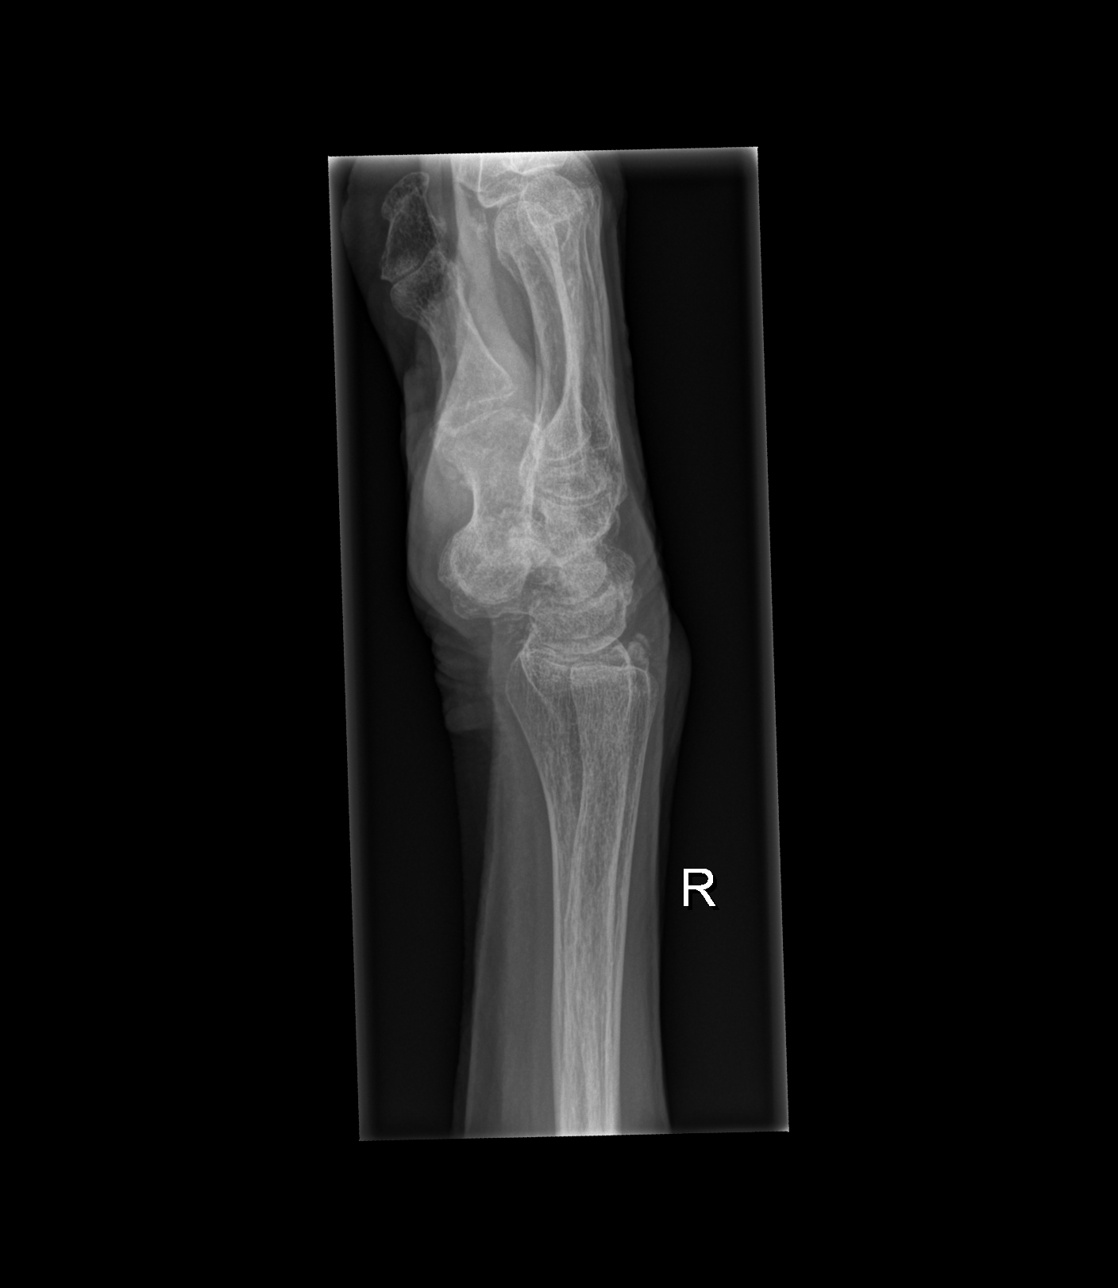

[x wrist navicular view right]
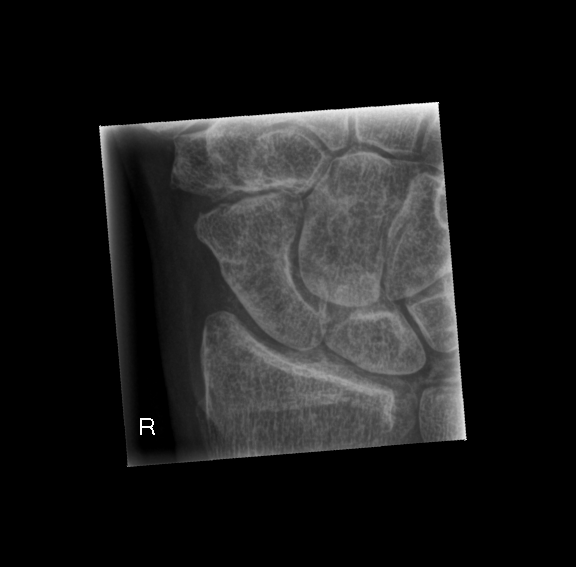

[4 of 4 positions shown; findings below may reference images not displayed]

FINDINGS: No fracture or dislocation of the right wrist proper. The carpus is
normally aligned. There is radiocarpal arthrosis and
chondrocalcinosis.

There is a nondisplaced spiral fracture of the included mid right
fourth metacarpal.
IMPRESSION: 1. No fracture or dislocation of the right wrist proper. The carpus
is normally aligned.

2. There is a nondisplaced spiral fracture of the included mid right
fourth metacarpal.

## 2018-12-24 IMAGING — CT CT HEAD W/O CM
3 series · 15 of 47 positions shown, 18 images · non-contrast
Comparison: None.

CLINICAL DATA: Fall, pain

EXAM:
CT HEAD WITHOUT CONTRAST
CT CERVICAL SPINE WITHOUT CONTRAST
TECHNIQUE: Multidetector CT imaging of the head and cervical spine was
performed following the standard protocol without intravenous
contrast. Multiplanar CT image reconstructions of the cervical spine
were also generated.

[Series 3: head wo · axial · 0.41mm/px · z∈[-159,-34]mm · 9 of 31 slices shown, 12 images]
[im 3/31  brain]
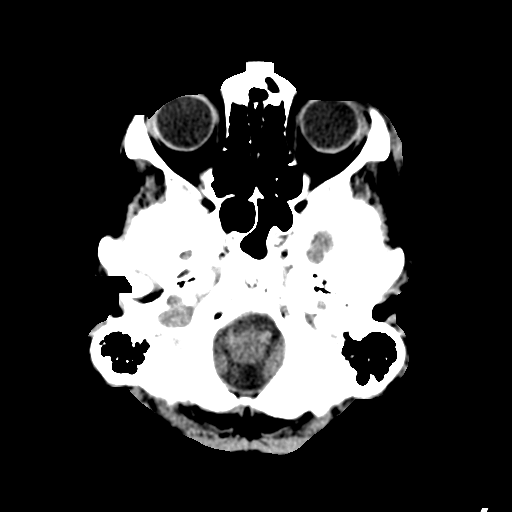
[im 3/31  bone]
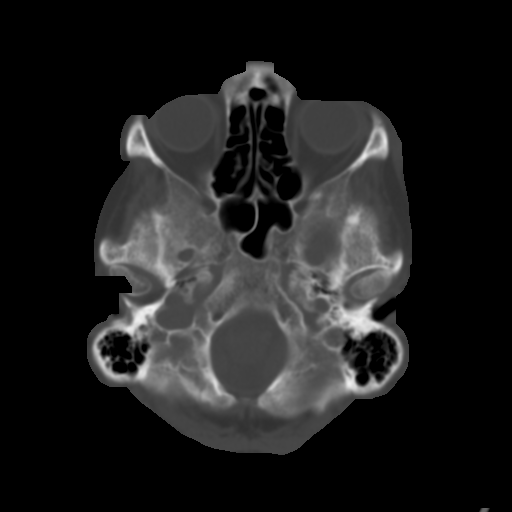
[im 6/31  brain]
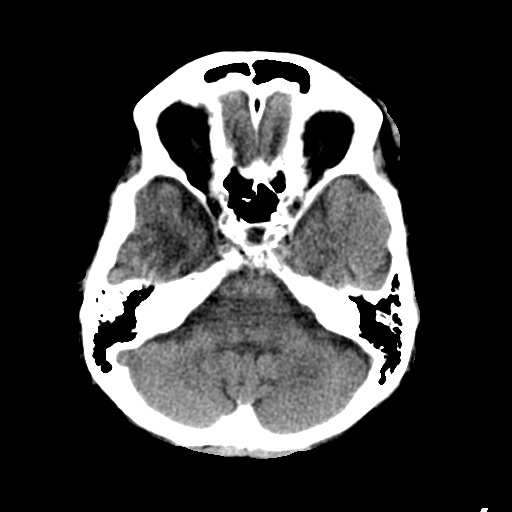
[im 9/31  brain]
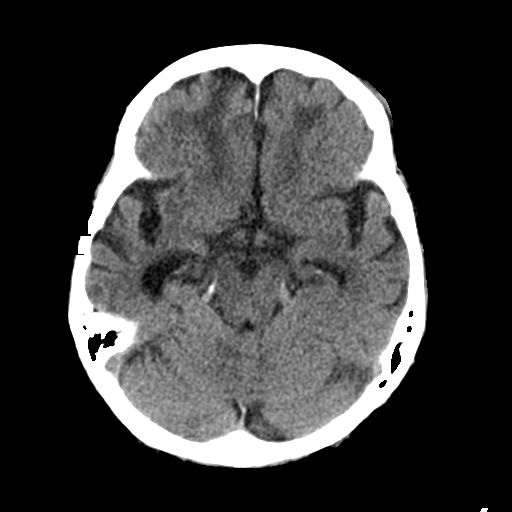
[im 12/31  brain]
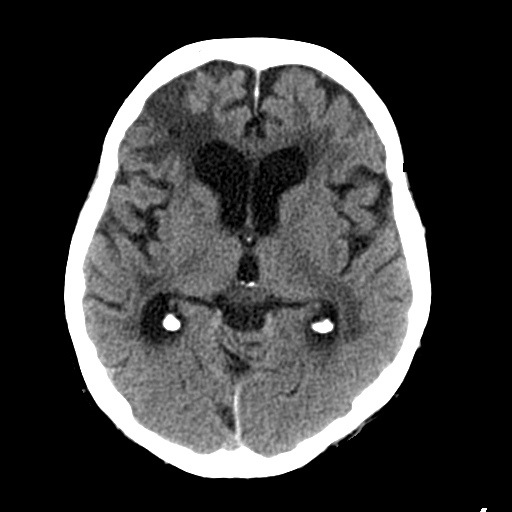
[im 16/31  brain]
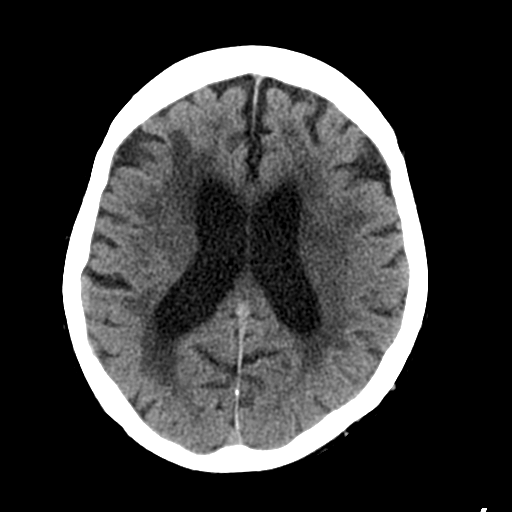
[im 16/31  bone]
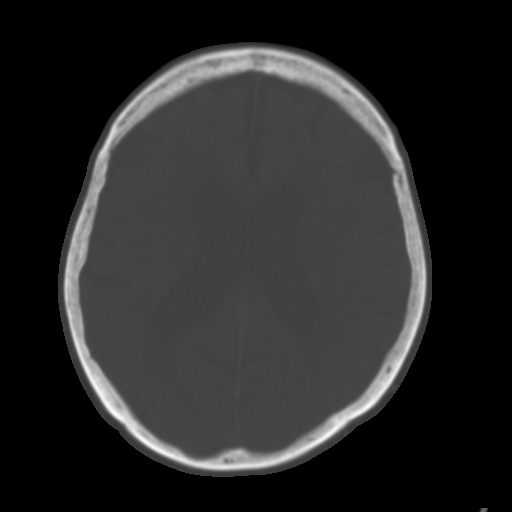
[im 19/31  brain]
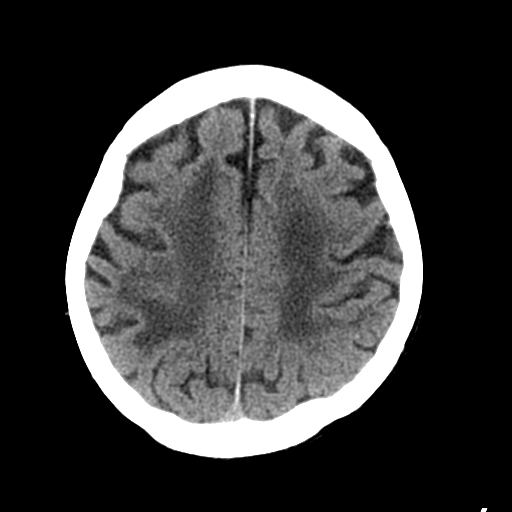
[im 22/31  brain]
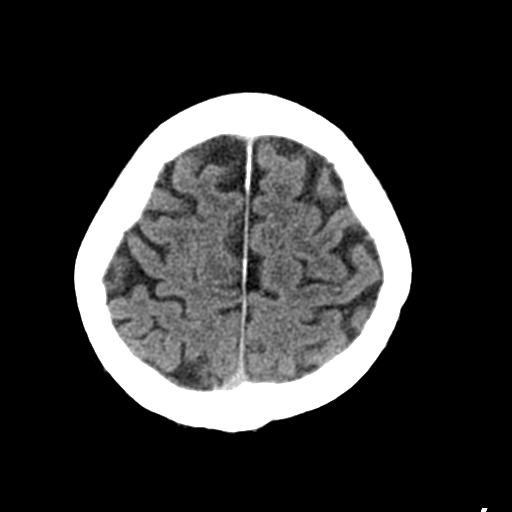
[im 25/31  brain]
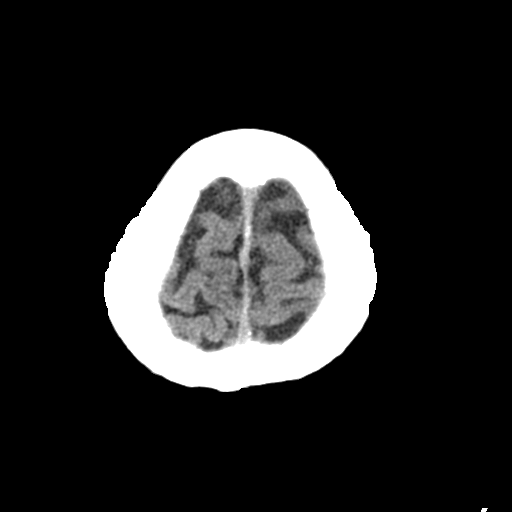
[im 28/31  brain]
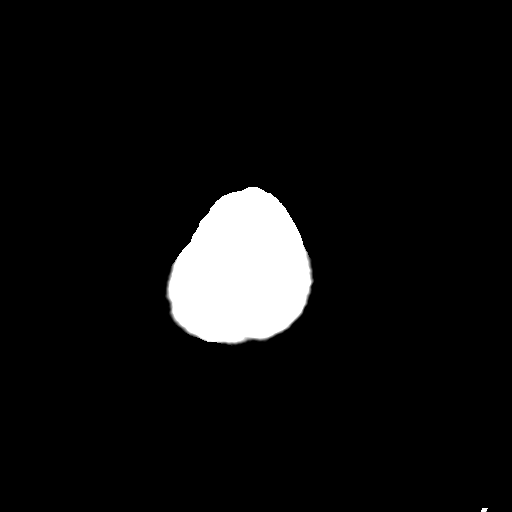
[im 28/31  bone]
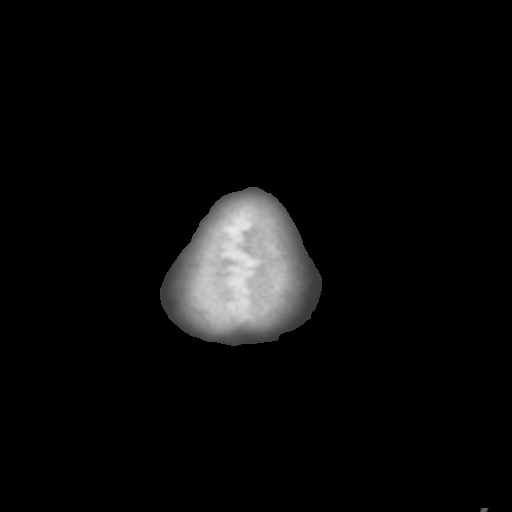

[Series 6: coronal soft tissue · coronal · 0.32mm/px · 3 of 74 slices shown]
[im 25/74  brain]
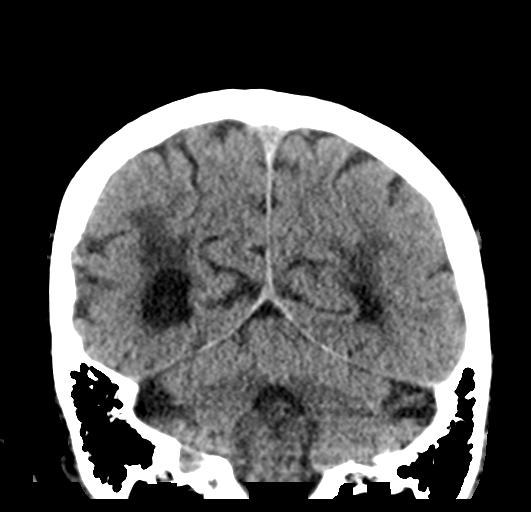
[im 33/74  brain]
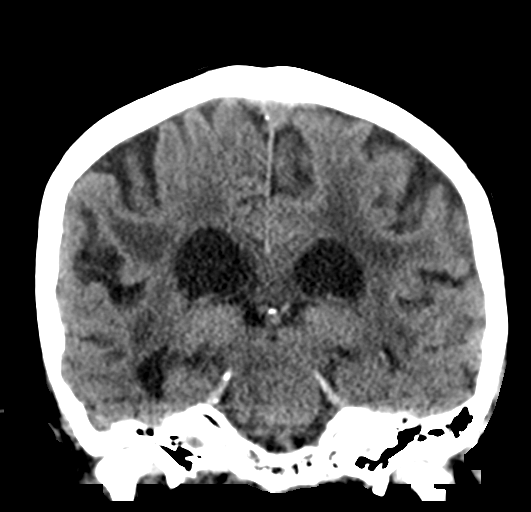
[im 41/74  brain]
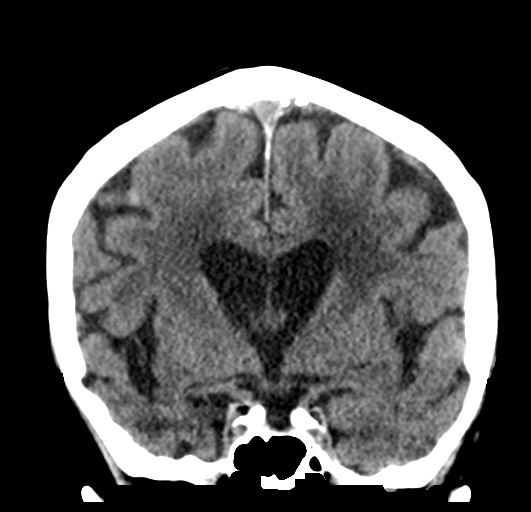

[Series 7: sagittal soft tissue · sagittal · 0.31mm/px · 3 of 57 slices shown]
[im 19/57  brain]
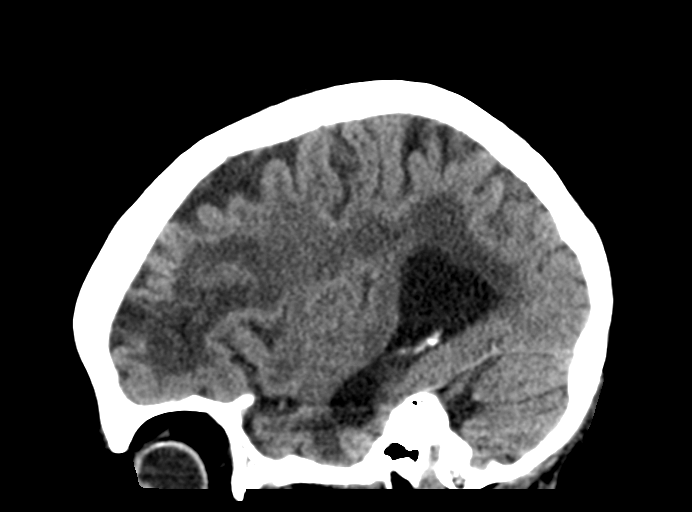
[im 29/57  brain]
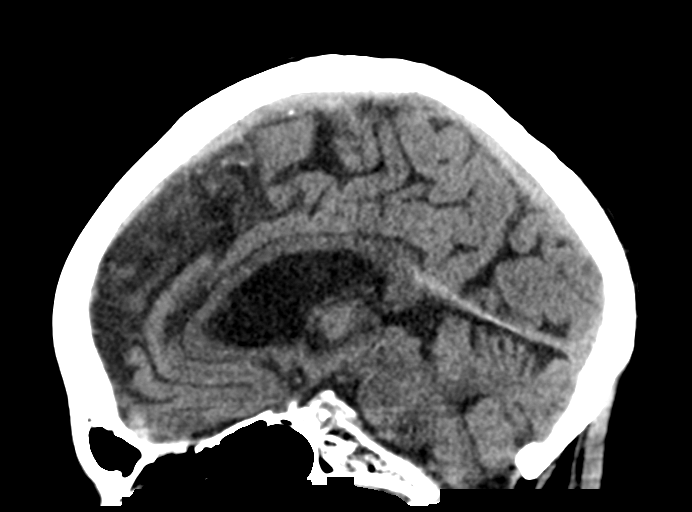
[im 38/57  brain]
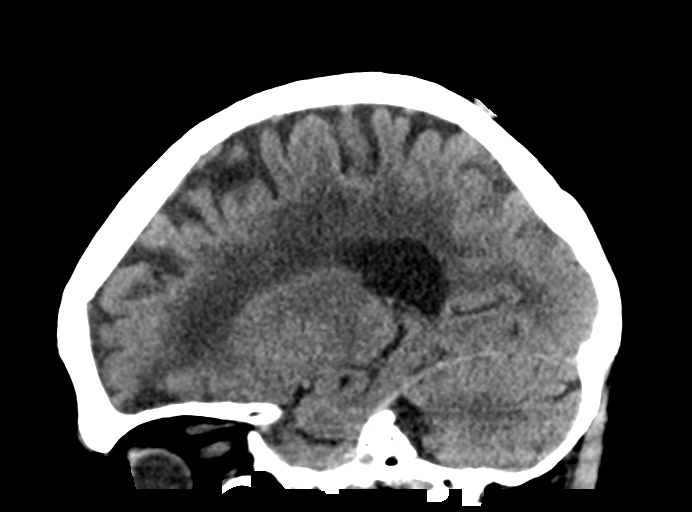

[15 of 47 positions shown; findings below may reference images not displayed]

FINDINGS: CT HEAD FINDINGS

Brain: No evidence of acute infarction, hemorrhage, hydrocephalus,
extra-axial collection or mass lesion/mass effect. Extensive
periventricular and deep white matter hypodensity. Encephalomalacia
of the anterior right frontal lobe (series 3, image 13).

Vascular: No hyperdense vessel or unexpected calcification.

Skull: Normal. Negative for fracture or focal lesion.

Sinuses/Orbits: No acute finding.

Other: Soft tissue edema of the left forehead and orbit.

CT CERVICAL SPINE FINDINGS

Alignment: Degenerative straightening and reversal of the normal
cervical lordosis.

Skull base and vertebrae: No acute fracture. No primary bone lesion
or focal pathologic process.

Soft tissues and spinal canal: No prevertebral fluid or swelling. No
visible canal hematoma.

Disc levels: Severe multilevel disc space height loss and
osteophytosis, worst from C4 through C7.

Upper chest: Negative.

Other: None.
IMPRESSION: 1.  No acute intracranial pathology.

2. Extensive small-vessel white matter disease. Encephalomalacia of
the anterior right frontal lobe (series 3, image 13), in keeping
with prior infarction.

3. No fracture or static subluxation of the cervical spine. Severe
multilevel disc space height loss and osteophytosis.

## 2018-12-24 MED ORDER — TETANUS-DIPHTH-ACELL PERTUSSIS 5-2.5-18.5 LF-MCG/0.5 IM SUSP
0.5000 mL | Freq: Once | INTRAMUSCULAR | Status: AC
  Administered 2018-12-24: 0.5 mL via INTRAMUSCULAR
  Filled 2018-12-24: qty 0.5

## 2018-12-24 NOTE — ED Notes (Signed)
An After Visit Summary was printed and given to the patient. Discharge instructions given and no further questions at this time.  

## 2018-12-24 NOTE — ED Triage Notes (Signed)
Pt comes to ed via ems, c/o is fall while walking her dog, no Loc , lac to left blephar  fall around 40 mins ago, no blood thinners. Right wrist pain. Fall on hard surface. Comes from [place],  Alert x 4, walks with walker.  V/s 186/90, pluse 86 reg, rr18, spo2 98 room air.

## 2018-12-24 NOTE — ED Provider Notes (Signed)
[facility] DEPT Provider Note   CSN: [id number] Arrival date & time: 12/24/18  2007     History   Chief Complaint Chief Complaint  Patient presents with   Fall    HPI Ann Johns is a 85 y.o. female hx of previous R intracerebral hemorrhage, seizures, here presenting with fall.  Patient states that she was walking her dog and had a mechanical fall and hit her head on the sidewalk .  She states that she was bleeding a lot afterwards.  She also is complaining of right wrist pain as well. Denies any chest pain or shortness of breath or back pain or hip pain.  She does not remember when her last tetanus shot was .     The history is provided by the patient.    Past Medical History:  Diagnosis Date   ICH (intracerebral hemorrhage) ([facility]) 11/01/2017   Right frontal   Seizures ([facility]) 11/01/2017    Patient Active Problem List   Diagnosis Date Noted   Seizures ([facility]) 11/01/2017   ICH (intracerebral hemorrhage) ([facility]) 11/01/2017    Past Surgical History:  Procedure Laterality Date   VAGINAL HYSTERECTOMY       OB History   No obstetric history on file.      Home Medications    Prior to Admission medications   Medication Sig Start Date End Date Taking? Authorizing Provider  Cholecalciferol (VITAMIN D3 PO) Take 5,000 Units by mouth daily.    [provider]  Cyanocobalamin (VITAMIN B-12 PO) Take 1,000 mcg by mouth daily.    [provider]  donepezil (ARICEPT) 5 MG tablet Take 1 tablet (5 mg total) by mouth at bedtime. 11/01/17   Kathrynn Ducking, MD  FOLIC ACID PO Take 1 Dose by mouth daily.    [provider]  levETIRAcetam (KEPPRA) 500 MG tablet Take 500 mg by mouth 2 (two) times daily.    [provider]  Multiple Vitamins-Minerals (CENTRUM PO) Take 1 tablet by mouth daily.    [provider]  propranolol (INDERAL) 20 MG tablet Take 20 mg by mouth daily.    [provider]     Family History No family history on file.  Social History Social History   Tobacco Use   Smoking status: Former Smoker   Smokeless tobacco: Never Used  Substance Use Topics   Alcohol use: Not Currently    Frequency: Never   Drug use: Never     Allergies   Sulfa antibiotics   Review of Systems Review of Systems  Skin: Positive for wound.  All other systems reviewed and are negative.    Physical Exam Updated Vital Signs BP (!) 186/93 (BP Location: Right Arm)    Pulse 81    Temp 98 F (36.7 C) (Oral)    Resp 14    Ht 5\' 2"  (1.575 m)    Wt 52.6 kg    SpO2 99%    BMI 21.22 kg/m   Physical Exam Vitals signs and nursing note reviewed.  HENT:     Head:     Comments: 2 cm laceration L forehead, dry blood on nose but no obvious nasal bone deformity  Eyes:     Extraocular Movements: Extraocular movements intact.     Pupils: Pupils are equal, round, and reactive to light.  Neck:     Musculoskeletal: Normal range of motion.  Cardiovascular:     Rate and Rhythm: Normal rate.     Pulses: Normal  pulses.     Heart sounds: Normal heart sounds.  Pulmonary:     Effort: Pulmonary effort is normal.     Breath sounds: Normal breath sounds.  Abdominal:     General: Abdomen is flat.     Palpations: Abdomen is soft.  Musculoskeletal:     Comments: Mild R wrist swelling but no obvious deformity, able to range the wrist. No spinal tenderness. No obvious other extremity trauma   Skin:    General: Skin is warm.     Capillary Refill: Capillary refill takes less than 2 seconds.  Neurological:     General: No focal deficit present.     Mental Status: She is alert and oriented to person, place, and time.     Cranial Nerves: No cranial nerve deficit.     Sensory: No sensory deficit.     Motor: No weakness.     Coordination: Coordination normal.  Psychiatric:        Mood and Affect: Mood normal.      ED Treatments / Results  Labs (all labs ordered are listed, but only  abnormal results are displayed) Labs Reviewed - No data to display  EKG None  Radiology No results found.  Procedures Procedures (including critical care time)  LACERATION REPAIR Performed by: Richardean Canal Authorized by: Richardean Canal Consent: Verbal consent obtained. Risks and benefits: risks, benefits and alternatives were discussed Consent given by: patient Patient identity confirmed: provided demographic data Prepped and Draped in normal sterile fashion Wound explored  Laceration Location: L forehead  Laceration Length: 2 cm  No Foreign Bodies seen or palpated  Anesthesia: none    Irrigation method: syringe Amount of cleaning: standard  Skin closure: dermabond   Technique: dermabond   Patient tolerance: Patient tolerated the procedure well with no immediate complications.   Medications Ordered in ED Medications  Tdap (BOOSTRIX) injection 0.5 mL (has no administration in time range)     Initial Impression / Assessment and Plan / ED Course  I have reviewed the triage vital signs and the nursing notes.  Pertinent labs & imaging results that were available during my care of the patient were reviewed by me and considered in my medical decision making (see chart for details).       Ann Johns is a 85 y.o. female here with fall with L forehead laceration, R wrist pain. Had mechanical fall. Will get CT head/neck/face. Will dermabond the laceration and will get R wrist xray. Nl neuro exam currently   9:37 PM Laceration dermabonded. CT head/neck/face showed no fractures. Xray wrist showed R 4th metacarpal fracture. She actually has no bony tenderness of the hand or fingers but mainly of the wrist. Will place in volar splint. She refused pain meds. Will have her follow up with ortho.    Final Clinical Impressions(s) / ED Diagnoses   Final diagnoses:  None    ED Discharge Orders    None       Charlynne Pander, MD 12/24/18 2139

## 2018-12-24 NOTE — ED Notes (Signed)
Pt ambulatory to the bathroom Minimal, 1 person, assist needed Pt with steady gait Pt with no complaints of dizziness or weakness

## 2018-12-24 NOTE — ED Notes (Signed)
Called [facility] spoke with CNA ashley report given

## 2018-12-24 NOTE — Discharge Instructions (Addendum)
Take tylenol, motrin for pain   Use the splint for comfort.   See hand doctor for your hand fracture   The glue and steri strips will fall off in a week. Expect your face to be black and blue for several days   See your primary care doctor  Return to ER if you have worse headaches, vomiting, weakness, numbness, another fall

## 2018-12-24 NOTE — ED Notes (Signed)
Called family for transport back to [facility]

## 2020-03-29 ENCOUNTER — Telehealth: Payer: Self-pay | Admitting: Neurology

## 2020-03-29 NOTE — Telephone Encounter (Signed)
Son has called to reschedule the appointment from 2020, son states no new symptoms.  He is aware RN will call if there are concerns

## 2020-04-02 ENCOUNTER — Ambulatory Visit (INDEPENDENT_AMBULATORY_CARE_PROVIDER_SITE_OTHER): Payer: Medicare Other | Admitting: Neurology

## 2020-04-02 ENCOUNTER — Encounter: Payer: Self-pay | Admitting: Neurology

## 2020-04-02 ENCOUNTER — Telehealth: Payer: Self-pay | Admitting: Neurology

## 2020-04-02 ENCOUNTER — Ambulatory Visit: Payer: Medicare Other | Admitting: Neurology

## 2020-04-02 VITALS — BP 123/77 | HR 85 | Ht 62.0 in | Wt 119.0 lb

## 2020-04-02 DIAGNOSIS — I611 Nontraumatic intracerebral hemorrhage in hemisphere, cortical: Secondary | ICD-10-CM

## 2020-04-02 DIAGNOSIS — R569 Unspecified convulsions: Secondary | ICD-10-CM

## 2020-04-02 NOTE — Progress Notes (Signed)
PATIENT: Ann Johns DOB: 1934-02-15  REASON FOR VISIT: follow up HISTORY FROM: patient  HISTORY OF PRESENT ILLNESS: Today 04/02/20 Ann Johns is an 87 year old female with history of right frontal intracranial hemorrhage that was spontaneous in June 2019, she had seizures, as her presenting symptom.  Were associated with staring off and unresponsiveness.  She lived in [place] at the time.  Dr. Anne Hahn reviewed MRI of the brain, did show extensive white matter disease, severe enough to result in problems with balance and memory.  Echocardiogram and carotid Doppler are relatively unremarkable, no source of extensive white matter changes.  Here today with her son, Ann Johns. She lives at [facility]. Back in 2019, was at AL facility, felt doing well enough to move to Independent living March 2020, then had pandemic, she couldn't go out in April 2020 to this appointment due to lock down. She is doing ok, does have memory issues, she will misplace things, try to change TV with phone. MMSE 21/30 today.  Is in OT/ST/PT for memory recall of words. Does fairly well with walking, uses walker when leaving apartment. Few falls last week, she fell out of bed, down at exercise and fell, this isn't normal for her. There was a bump to the right occipital area out of the bed, not visible, is acting normal. Was not injured. Is no longer taking Aricept, not sure why she is off. Currently taking Namenda 5 mg twice per day from PCP. She has not had any seizures since hemorrhage. She is never dizzy.  She is still on Keppra, her other son manages her medications.  HISTORY 11/01/2017 Dr. Anne Hahn: Ann Johns is an 87 year old right-handed white female with a history of a right frontal intracranial hemorrhage that was spontaneous in nature on 22 July 2017.  The patient presented to the emergency room after 2 seizure events, the episodes were associated with staring off, and unresponsiveness.  The patient lived  in [place], [place] at the time, she went to the emergency room and was found to have the intracranial hemorrhage but also found to have extensive small vessel ischemic changes that were chronic in nature.  The pattern of intracranial hemorrhage was felt to be consistent with amyloid angiopathy, the patient was not on aspirin prior to the hospitalization.  The patient has been placed on Keppra, she has not had any further seizure events.  She had reported some memory problems for about a year and a half prior to the hospitalization, since the intracranial hemorrhage she has had some reports of balance issues, she continues to have some memory problems, she is now in an assisted living facility, she is in [place] with her family.  She still is having daily headaches since the hospitalization, she takes Tylenol for this.  The patient denies any vision changes, she recently has developed some dysphonia, she will be seen by ENT in the near future.  The patient reports no problem with swallowing or choking.  She has no numbness or weakness of the extremities.  She does have a history of tremors, she takes propranolol for this.  She comes to this office for an evaluation.  She has never had a history of hypertension in the past.  REVIEW OF SYSTEMS: Out of a complete 14 system review of symptoms, the patient complains only of the following symptoms, and all other reviewed systems are negative.  See HPI  ALLERGIES: Allergies  Allergen Reactions  . Sulfa Antibiotics Rash    HOME  MEDICATIONS: Outpatient Medications Prior to Visit  Medication Sig Dispense Refill  . Cholecalciferol (VITAMIN D3 PO) Take 5,000 Units by mouth daily.    . Cyanocobalamin (VITAMIN B-12 PO) Take 1,000 mcg by mouth daily.    Marland Kitchen levETIRAcetam (KEPPRA) 500 MG tablet Take 500 mg by mouth 2 (two) times daily.    . Multiple Vitamins-Minerals (CENTRUM PO) Take 1 tablet by mouth daily.    . propranolol (INDERAL) 20 MG tablet Take 20 mg  by mouth daily.    Marland Kitchen donepezil (ARICEPT) 5 MG tablet Take 1 tablet (5 mg total) by mouth at bedtime. 90 tablet 1  . FOLIC ACID PO Take 1 Dose by mouth daily.     No facility-administered medications prior to visit.    PAST MEDICAL HISTORY: Past Medical History:  Diagnosis Date  . ICH (intracerebral hemorrhage) ([facility]) 11/01/2017   Right frontal  . Seizures ([facility]) 11/01/2017    PAST SURGICAL HISTORY: Past Surgical History:  Procedure Laterality Date  . VAGINAL HYSTERECTOMY      FAMILY HISTORY: No family history on file.  SOCIAL HISTORY: Social History   Socioeconomic History  . Marital status: Widowed    Spouse name: Not on file  . Number of children: Not on file  . Years of education: 56  . Highest education level: Not on file  Occupational History  . Not on file  Tobacco Use  . Smoking status: Former Games developer  . Smokeless tobacco: Never Used  Vaping Use  . Vaping Use: Never used  Substance and Sexual Activity  . Alcohol use: Not Currently  . Drug use: Never  . Sexual activity: Not on file  Other Topics Concern  . Not on file  Social History Narrative   Lives   Right handed   Caffeine use:    Social Determinants of Health   Financial Resource Strain: Not on file  Food Insecurity: Not on file  Transportation Needs: Not on file  Physical Activity: Not on file  Stress: Not on file  Social Connections: Not on file  Intimate Partner Violence: Not on file      PHYSICAL EXAM  Vitals:   04/02/20 1416  BP: 123/77  Pulse: 85  Weight: 119 lb (54 kg)  Height: 5\' 2"  (1.575 m)   Body mass index is 21.77 kg/m.  Generalized: Well developed, in no acute distress  MMSE - Mini Mental State Exam 04/02/2020  Orientation to time 4  Orientation to Place 5  Registration 3  Attention/ Calculation 1  Recall 0  Language- name 2 objects 2  Language- repeat 1  Language- follow 3 step command 3  Language- read & follow direction 1  Write a sentence 1  Copy design 0   Total score 21    Neurological examination  Mentation: Alert oriented to time, place, history is equally provided by the patient and her son. Follows all commands speech and language fluent.  Is cooperative, well-appearing, very pleasant. Cranial nerve II-XII: Pupils were equal round reactive to light. Extraocular movements were full, visual field were full on confrontational test. Facial sensation and strength were normal. Head turning and shoulder shrug  were normal and symmetric. Motor: Good strength all extremities Sensory: Sensory testing is intact to soft touch on all 4 extremities. No evidence of extinction is noted.  Coordination: Cerebellar testing reveals good finger-nose-finger and heel-to-shin bilaterally.  Gait and station: Has to push off from seated position to stand, gait is slightly wide-based, cautious, but can walk independently in  the room, uses Rollator in the hallway Reflexes: Deep tendon reflexes are symmetric and normal bilaterally.   DIAGNOSTIC DATA (LABS, IMAGING, TESTING) - I reviewed patient records, labs, notes, testing and imaging myself where available.  No results found for: WBC, HGB, HCT, MCV, PLT No results found for: NA, K, CL, CO2, GLUCOSE, BUN, CREATININE, CALCIUM, PROT, ALBUMIN, AST, ALT, ALKPHOS, BILITOT, GFRNONAA, GFRAA No results found for: CHOL, HDL, LDLCALC, LDLDIRECT, TRIG, CHOLHDL No results found for: OMVE7M No results found for: VITAMINB12 No results found for: TSH    ASSESSMENT AND PLAN 87 y.o. year old female  has a past medical history of ICH (intracerebral hemorrhage) ([facility]) (11/01/2017) and Seizures ([facility]) (11/01/2017). here with:  1.  Reported memory disturbance -MMSE 21/30 today -PCP has been managing, off Aricept for unknown reason -Can continue Namenda, can be increased to 10 mg twice daily, suggest running by PCP, in case reason was not increased -Still lives in independent living, seems to do well with this for now  2.  History  of right frontal intracranial hemorrhage 3.  Extensive small vessel disease by MRI brain 4.  Mild gait disorder 5.  Seizures following intracranial hemorrhage 6.  Recent falls -Will check MRI of the brain without contrast, given recent falls, with 1 bump to the head, also comparison of white matter disease, previous intracranial hemorrhage was felt consistent with amyloid angiopathy -Will continue Keppra 500 mg twice a day for now, previously Dr. Anne Hahn is indicated she may be able to come off Keppra, as she has had no further seizures except during the hyperacute phase following intracranial hemorrhage -Already in PT/OT/ST -She will follow-up in 6 months or sooner if needed with Dr. Anne Hahn, has not been seen since September 2019  I spent 30 minutes of face-to-face and non-face-to-face time with patient.  This included previsit chart review, lab review, study review, order entry, electronic health record documentation, patient education.  Margie Ege, AGNP-C, DNP 04/02/2020, 2:50 PM [facility] [address] [place], [place] [address] [phone]

## 2020-04-02 NOTE — Progress Notes (Signed)
I have read the note, and I agree with the clinical assessment and plan.  Ludwig Tugwell K Seymour Pavlak   

## 2020-04-02 NOTE — Patient Instructions (Signed)
namenda could be increased to 10 mg twice daily if tolerated  Check mri today  Continue Keppra for now See you back in 6 months

## 2020-04-02 NOTE — Telephone Encounter (Signed)
Medicare/humana supp order sent to GI. No auth they will reach out to the patient to schedule.

## 2020-04-16 ENCOUNTER — Ambulatory Visit
Admission: RE | Admit: 2020-04-16 | Discharge: 2020-04-16 | Disposition: A | Discharge status: Home / Self Care | Payer: Medicare Other | Source: Ambulatory Visit | Attending: Neurology | Admitting: Neurology

## 2020-04-16 ENCOUNTER — Other Ambulatory Visit: Payer: Self-pay

## 2020-04-16 DIAGNOSIS — I611 Nontraumatic intracerebral hemorrhage in hemisphere, cortical: Secondary | ICD-10-CM

## 2020-04-22 ENCOUNTER — Telehealth: Payer: Self-pay | Admitting: Neurology

## 2020-04-22 NOTE — Telephone Encounter (Signed)
Called and LMVM for [place], that since pt is doing ok, no adverse SE would continue as is and discuss next OV.  She is to call back if questions.

## 2020-04-22 NOTE — Telephone Encounter (Signed)
Please call the patient.  MRI of the brain does not show any acute findings. MRI of the brain certainly is abnormal, severe white matter changes and encephalomalacia of the right frontal area.    In regards to the Keppra, my understanding is her only seizure occurred in the hyperacute phase of intracranial hemorrhage, we could consider a slow dose reduction of the Keppra, to work towards elimination.  She is in a somewhat supervised environment independent living, doesn't drive. In contrast, she is on low dose, would be reasonable to continue if family preference. We could leave alone for now, revisit at next follow-up unless her son's feels strongly.     IMPRESSION: 04/16/2020  MRI brain (without) demonstrating: -Chronic right frontal intracerebral hemorrhage. -Bilateral frontal, right parietal and right temporal sulcal hemosiderin deposition. -Moderate atrophy and moderate ventriculomegaly. -Moderate chronic small vessel ischemic disease. -No acute findings.

## 2020-04-22 NOTE — Telephone Encounter (Signed)
She has no adverse effects of Keppra. How about we keep at current dose, can discuss at next office visit.

## 2020-04-22 NOTE — Telephone Encounter (Signed)
I called pts daughter in law, stephanie.  I relayed message and she sates that is amenable to taper, but has reservations (what to watch for , warning sings to look for, get another MRI (how often? Relating to seeing how she is doing?    Reduce to 1/2 tablet po bid.  Concerned that if something happens and pt getting hurt, please advise.

## 2020-10-03 ENCOUNTER — Encounter: Payer: Self-pay | Admitting: Neurology

## 2020-10-03 ENCOUNTER — Ambulatory Visit (INDEPENDENT_AMBULATORY_CARE_PROVIDER_SITE_OTHER): Payer: Medicare Other | Admitting: Neurology

## 2020-10-03 VITALS — BP 109/64 | HR 78 | Ht 62.0 in | Wt 114.0 lb

## 2020-10-03 DIAGNOSIS — R4189 Other symptoms and signs involving cognitive functions and awareness: Secondary | ICD-10-CM | POA: Insufficient documentation

## 2020-10-03 DIAGNOSIS — R413 Other amnesia: Secondary | ICD-10-CM | POA: Diagnosis not present

## 2020-10-03 DIAGNOSIS — I611 Nontraumatic intracerebral hemorrhage in hemisphere, cortical: Secondary | ICD-10-CM

## 2020-10-03 DIAGNOSIS — R569 Unspecified convulsions: Secondary | ICD-10-CM | POA: Diagnosis not present

## 2020-10-03 DIAGNOSIS — I679 Cerebrovascular disease, unspecified: Secondary | ICD-10-CM | POA: Diagnosis not present

## 2020-10-03 DIAGNOSIS — R29818 Other symptoms and signs involving the nervous system: Secondary | ICD-10-CM | POA: Insufficient documentation

## 2020-10-03 HISTORY — DX: Other amnesia: R41.3

## 2020-10-03 HISTORY — DX: Cerebrovascular disease, unspecified: I67.9

## 2020-10-03 NOTE — Progress Notes (Signed)
Reason for visit: Cerebrovascular disease, memory disturbance, gait disturbance  Ann Johns is an 87 y.o. female  History of present illness:  Ann Johns is an 87 year old right-handed white female with a history of a spontaneous right frontal hemorrhage that occurred in June 2019.  MRI of the brain has shown extensive white matter disease as well.  There was some question whether she had cerebral amyloid angiopathy, but a review of a recent MRI of the brain done in March 2022 does not show microhemorrhages that would suggest this diagnosis.  The patient currently is at [facility] in an independent living apartment, but the family comes with her today indicating that she is not functioning well independently.  She is requiring increasing levels of supervision, she cannot take her medications without being administered to her, she has difficulty with keeping track of time.  For this reason, she is missing meals and missing medications.  She walks with a walker, she has not had any falls.  She is not taking showers on a regular basis, her personal hygiene is dropping off.  She has not had any further seizures.  She is on low-dose Namenda taking 5 mg twice daily, she is not on Aricept.  She remains on Keppra.  The seizures that occurred previously occurred in the hyperacute timeframe just following the hemorrhage.  Past Medical History:  Diagnosis Date   Cerebrovascular disease 10/03/2020   ICH (intracerebral hemorrhage) ([facility]) 11/01/2017   Right frontal   Memory change 10/03/2020   Seizures ([facility]) 11/01/2017    Past Surgical History:  Procedure Laterality Date   VAGINAL HYSTERECTOMY      History reviewed. No pertinent family history.  Social history:  reports that she has quit smoking. She has never used smokeless tobacco. She reports that she does not currently use alcohol. She reports that she does not use drugs.    Allergies  Allergen Reactions   Sulfa Antibiotics Rash     Medications:  Prior to Admission medications   Medication Sig Start Date End Date Taking? Authorizing Provider  Cholecalciferol (VITAMIN D3 PO) Take 5,000 Units by mouth daily.    [provider]  Cyanocobalamin (VITAMIN B-12 PO) Take 1,000 mcg by mouth daily.    [provider]  donepezil (ARICEPT) 5 MG tablet Take 1 tablet (5 mg total) by mouth at bedtime. 11/01/17   York Spaniel, MD  FOLIC ACID PO Take 1 Dose by mouth daily.    [provider]  levETIRAcetam (KEPPRA) 500 MG tablet Take 500 mg by mouth 2 (two) times daily.    [provider]  Multiple Vitamins-Minerals (CENTRUM PO) Take 1 tablet by mouth daily.    [provider]  propranolol (INDERAL) 20 MG tablet Take 20 mg by mouth daily.    [provider]    ROS:  Out of a complete 14 system review of symptoms, the patient complains only of the following symptoms, and all other reviewed systems are negative.  Walking difficulty Memory problems, confusion  Blood pressure 109/64, pulse 78, height 5\' 2"  (1.575 m), weight 114 lb (51.7 kg).  Physical Exam  General: The patient is alert and cooperative at the time of the examination.  Skin: No significant peripheral edema is noted.   Neurologic Exam  Mental status: The patient is alert and oriented x 3 at the time of the examination. The Mini-Mental status examination done today shows a total score of 13/30.   Cranial nerves: Facial symmetry is  present. Speech is normal, no aphasia or dysarthria is noted. Extraocular movements are full. Visual fields are full.  Motor: The patient has good strength in all 4 extremities.  Sensory examination: Soft touch sensation is symmetric on the face, arms, and legs.  Coordination: The patient has good finger-nose-finger and heel-to-shin bilaterally.  Mild tremor seen with finger-nose-finger bilaterally.  Gait and station: The patient has a slightly wide-based gait, she uses a  walker for ambulation and seems to have good stride and good turns with a walker.  She is able to walk independently.  Tandem gait was not attempted.  Romberg is negative.  Reflexes: Deep tendon reflexes are symmetric.   MRI brain 04/16/20:  IMPRESSION:    MRI brain (without) demonstrating: -Chronic right frontal intracerebral hemorrhage. -Bilateral frontal, right parietal and right temporal sulcal hemosiderin deposition. -Moderate atrophy and moderate ventriculomegaly. -Moderate chronic small vessel ischemic disease. -No acute findings.  * MRI scan images were reviewed online. I agree with the written report.    Assessment/Plan:  1.  Cerebrovascular disease  2.  Gait disorder  3.  Dementia, progressive  The patient comes in with her family today, they are making arrangements for her to be transferred to an assisted living situation where her medications are administered to her.  The patient will be tapered off of the Keppra, she will go to 1/2 tablet twice daily for 3 weeks and then stop the medication.  The Namenda will be increased taking 5 mg in the morning and 10 mg in the evening for 1 week, then go to 10 mg twice daily.  If she tolerates this dose, they will call me for a prescription of the 10 mg twice daily dosing.  The patient will follow up here in 6 months.  In the future, the patient can be seen through Dr. Marjory Lies.  Marlan Palau MD 10/03/2020 3:49 PM  [facility] [address] [place], [place] [address]  Phone [phone] Fax [phone]

## 2020-10-03 NOTE — Patient Instructions (Signed)
With the Namenda 5 mg tablets, begin one in the morning and 2 in the evening for 1 week, then take 2 twice a day. If she tolerates this dose, call for a 10 mg tablet prescription.  With the Keppra 500 mg tablet, start 1/2 tablet twice a day for 3 weeks, then stop. If any seizure event occurs, please contact our office.

## 2020-10-09 ENCOUNTER — Telehealth: Payer: Self-pay | Admitting: Neurology

## 2020-10-09 ENCOUNTER — Encounter: Payer: Self-pay | Admitting: Neurology

## 2020-10-09 NOTE — Telephone Encounter (Signed)
Pt's son Jonny Ruiz called stating he would like some type of generic letter written up stating what his mother is being treated for here in our office. He also states he would like to update his mothers DPR to include his and his brothers spouse I informed him when she comes back for her next office visit that is when she would need to make changes to that, however he didn't understand that so if you all could over that with him as well. Jonny Ruiz is requesting a call back.

## 2020-10-09 NOTE — Telephone Encounter (Signed)
I called the son, I will dictate a letter for the patient, they wish to have a letter mailed to her home address.

## 2020-10-09 NOTE — Telephone Encounter (Signed)
Contacted pt son back, per DPR, he stated he would like the letter so he can get things done at the independent facility that she is in such as therapy, care plans, ect or if needed, having to move her to a memory care facility. Are you willing to write a letter?   Also reiterated to him that we can not change her DPR without her permission and signature. Informed him that on  04/02/20 she signed that we could only speak to him and his brother. That DPR was also verified again by her at last weeks visit 10/03/20. She will need to come signa new one to change it. He understood and was appreciative for the call back.

## 2020-10-09 NOTE — Telephone Encounter (Signed)
Letter printed, signed by Dr Anne Hahn and put in mail to patient's home address as requested.

## 2020-10-22 ENCOUNTER — Telehealth: Payer: Self-pay | Admitting: Neurology

## 2020-10-22 MED ORDER — MEMANTINE HCL 10 MG PO TABS: 10.0000 mg | ORAL_TABLET | Freq: Two times a day (BID) | ORAL | 1 refills | Status: DC

## 2020-10-22 NOTE — Telephone Encounter (Signed)
Pt daughter, Norberta Stobaugh called, she needs prescription for Memantine increased to 10 mg. Would like a call from the nursel

## 2020-10-23 ENCOUNTER — Other Ambulatory Visit: Payer: Self-pay | Admitting: Family Medicine

## 2020-10-23 ENCOUNTER — Ambulatory Visit
Admission: RE | Admit: 2020-10-23 | Discharge: 2020-10-23 | Disposition: A | Discharge status: Home / Self Care | Payer: Medicare Other | Source: Ambulatory Visit | Attending: Family Medicine | Admitting: Family Medicine

## 2020-10-23 ENCOUNTER — Other Ambulatory Visit: Payer: Self-pay

## 2020-10-23 DIAGNOSIS — M25561 Pain in right knee: Secondary | ICD-10-CM

## 2020-10-23 DIAGNOSIS — M25562 Pain in left knee: Secondary | ICD-10-CM

## 2020-10-23 IMAGING — CR DG KNEE COMPLETE 4+V*L*
4 series · 4 of 4 positions shown · non-contrast
Comparison: None.

CLINICAL DATA: Bilateral knee pain

EXAM:
RIGHT KNEE - COMPLETE 4+ VIEW; LEFT KNEE - COMPLETE 4+ VIEW

[t knee ap left (1 of 3)]
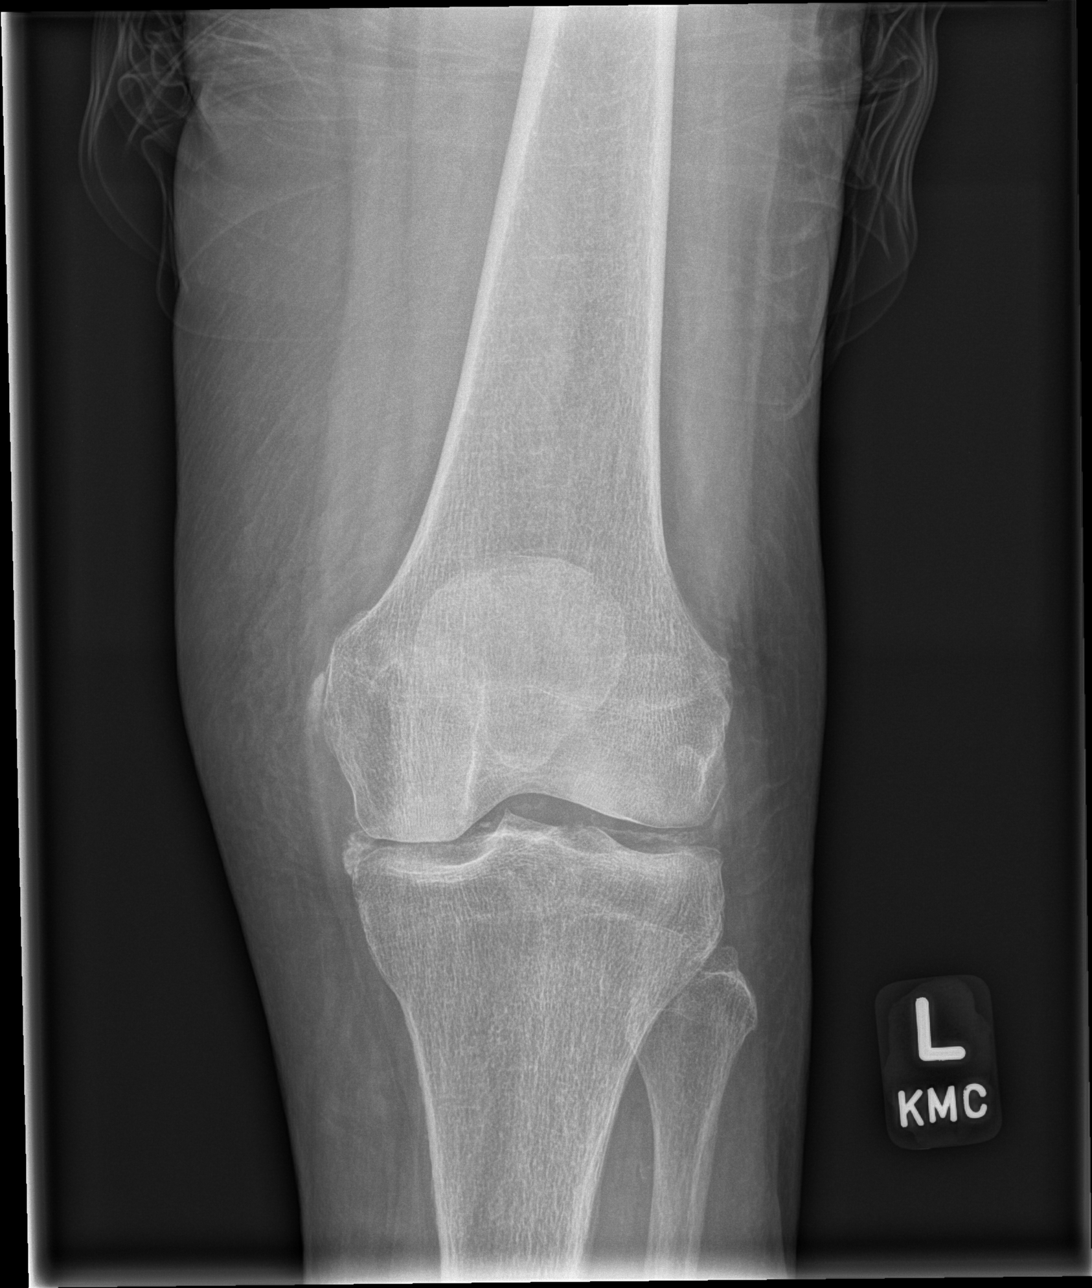

[t knee ap left (2 of 3)]
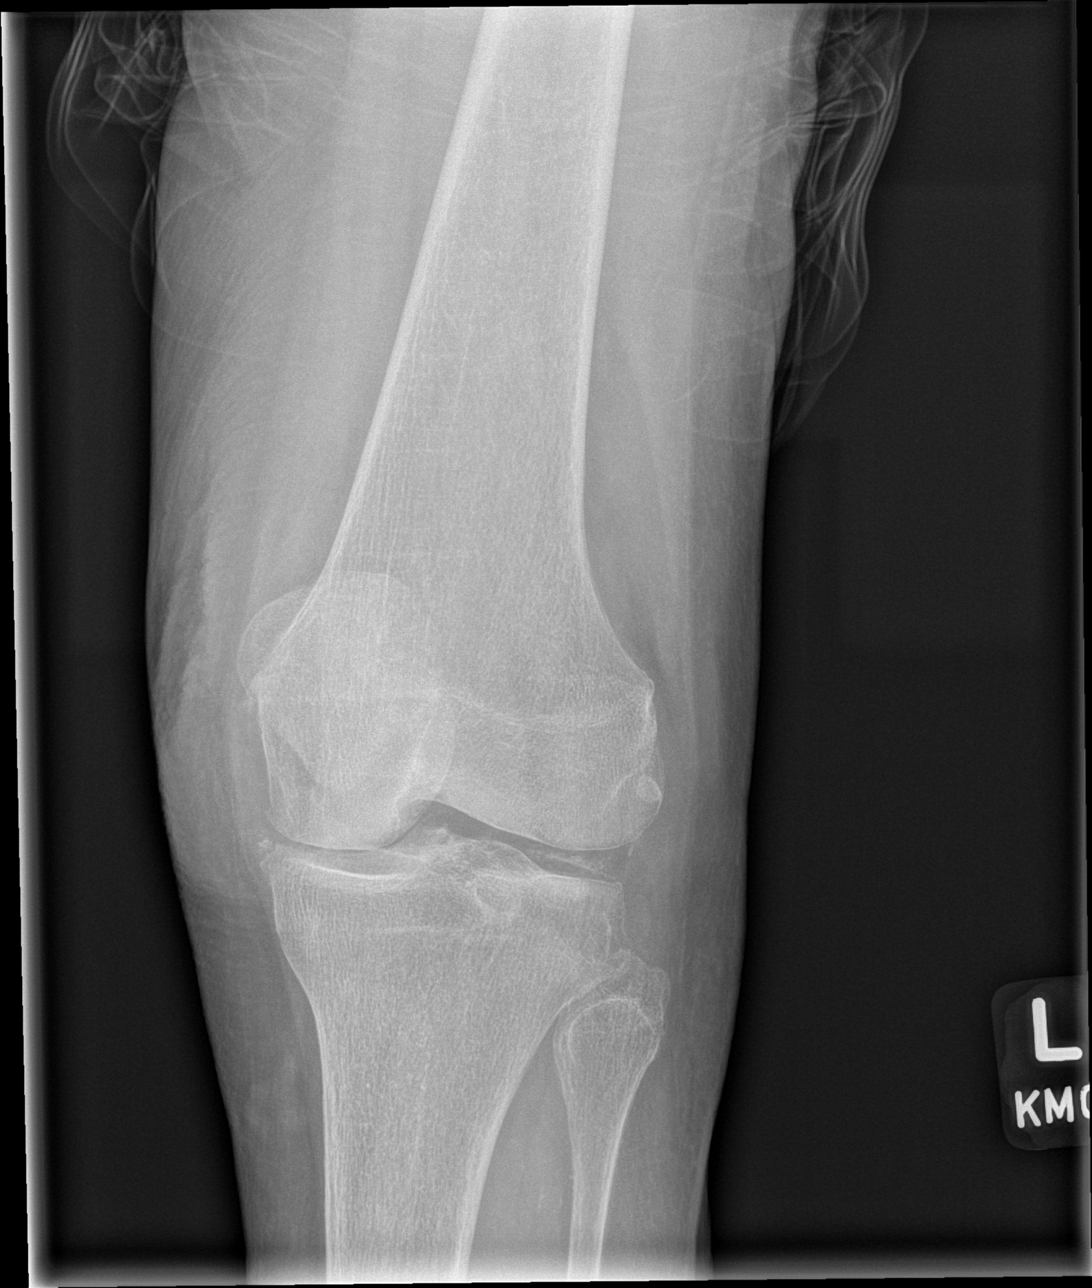

[t knee ap left (3 of 3)]
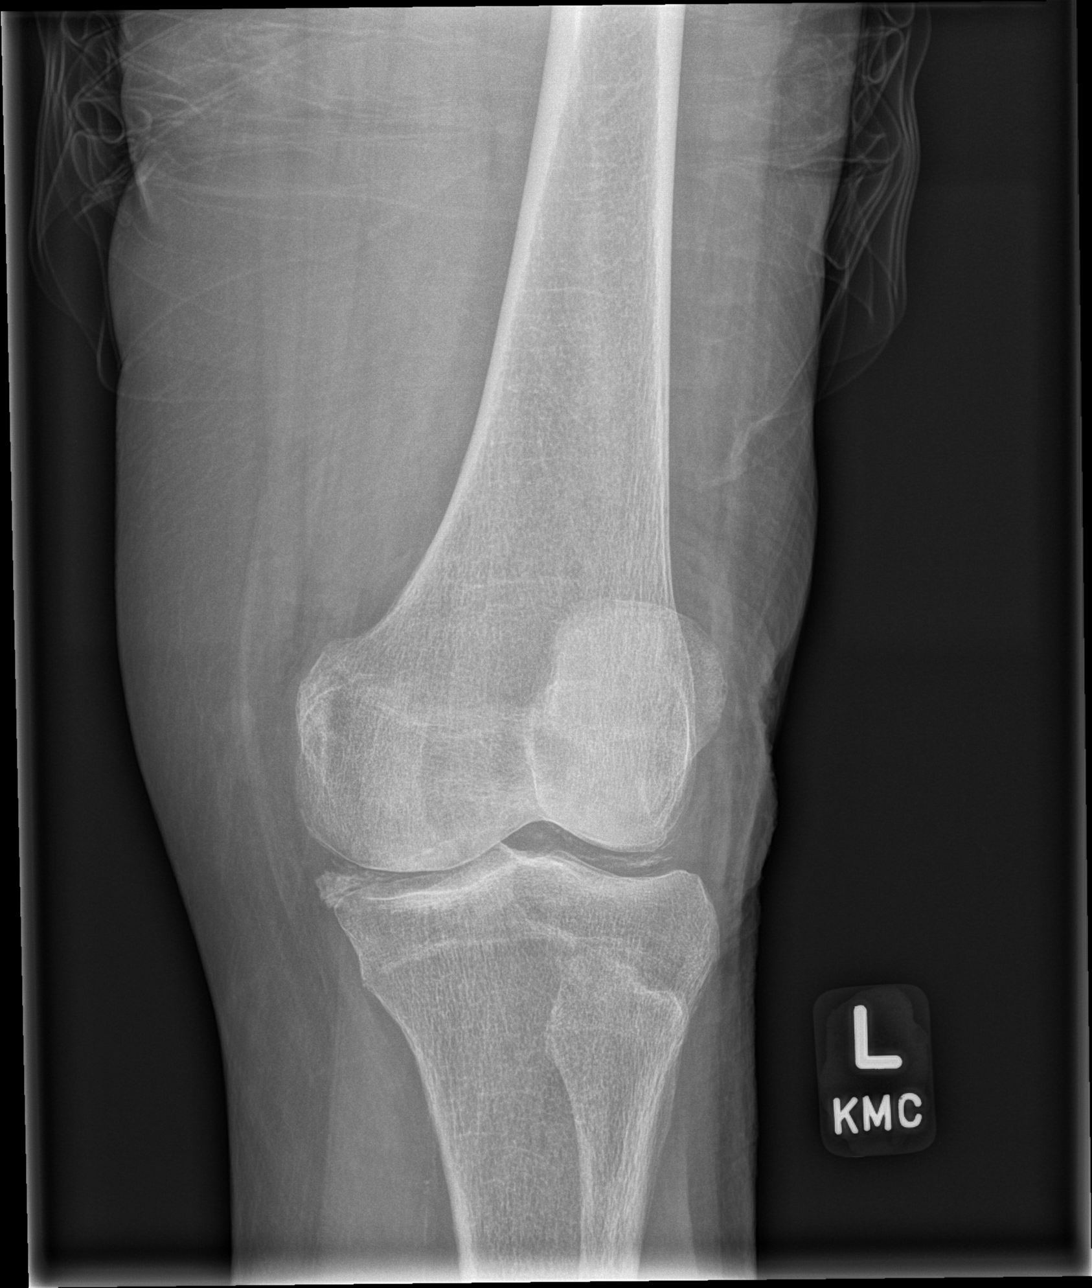

[t knee lat left]
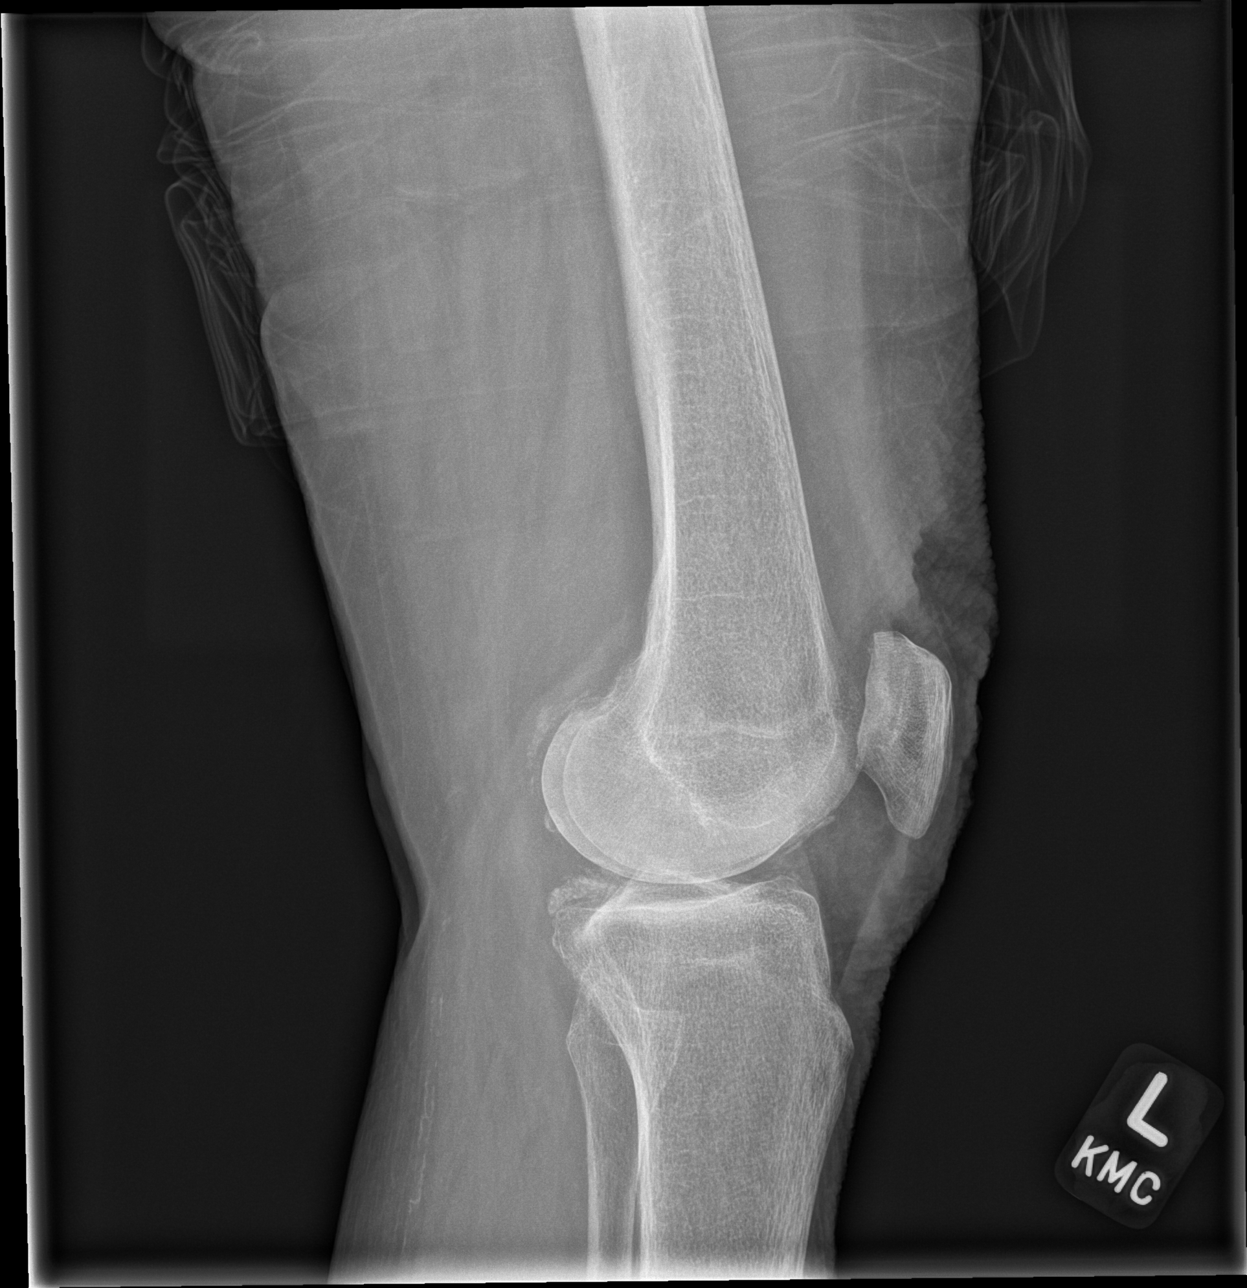

[4 of 4 positions shown; findings below may reference images not displayed]

FINDINGS: No evidence of fracture or dislocation. Likely trace joint
effusions. Tricompartmental degenerative changes, worst in the
medial compartment bilaterally. Meniscal calcifications bilaterally.
Soft tissues are otherwise unremarkable.
IMPRESSION: No acute fracture or dislocation. Tricompartmental degenerative
changes, with meniscal calcifications bilaterally, which can be due
to trauma, degenerative disease, or underlying [HOSPITAL] arthropathy.

## 2020-10-23 IMAGING — CR DG KNEE COMPLETE 4+V*R*
4 series · 4 of 4 positions shown · non-contrast
Comparison: None.

CLINICAL DATA: Bilateral knee pain

EXAM:
RIGHT KNEE - COMPLETE 4+ VIEW; LEFT KNEE - COMPLETE 4+ VIEW

[t knee ap right]
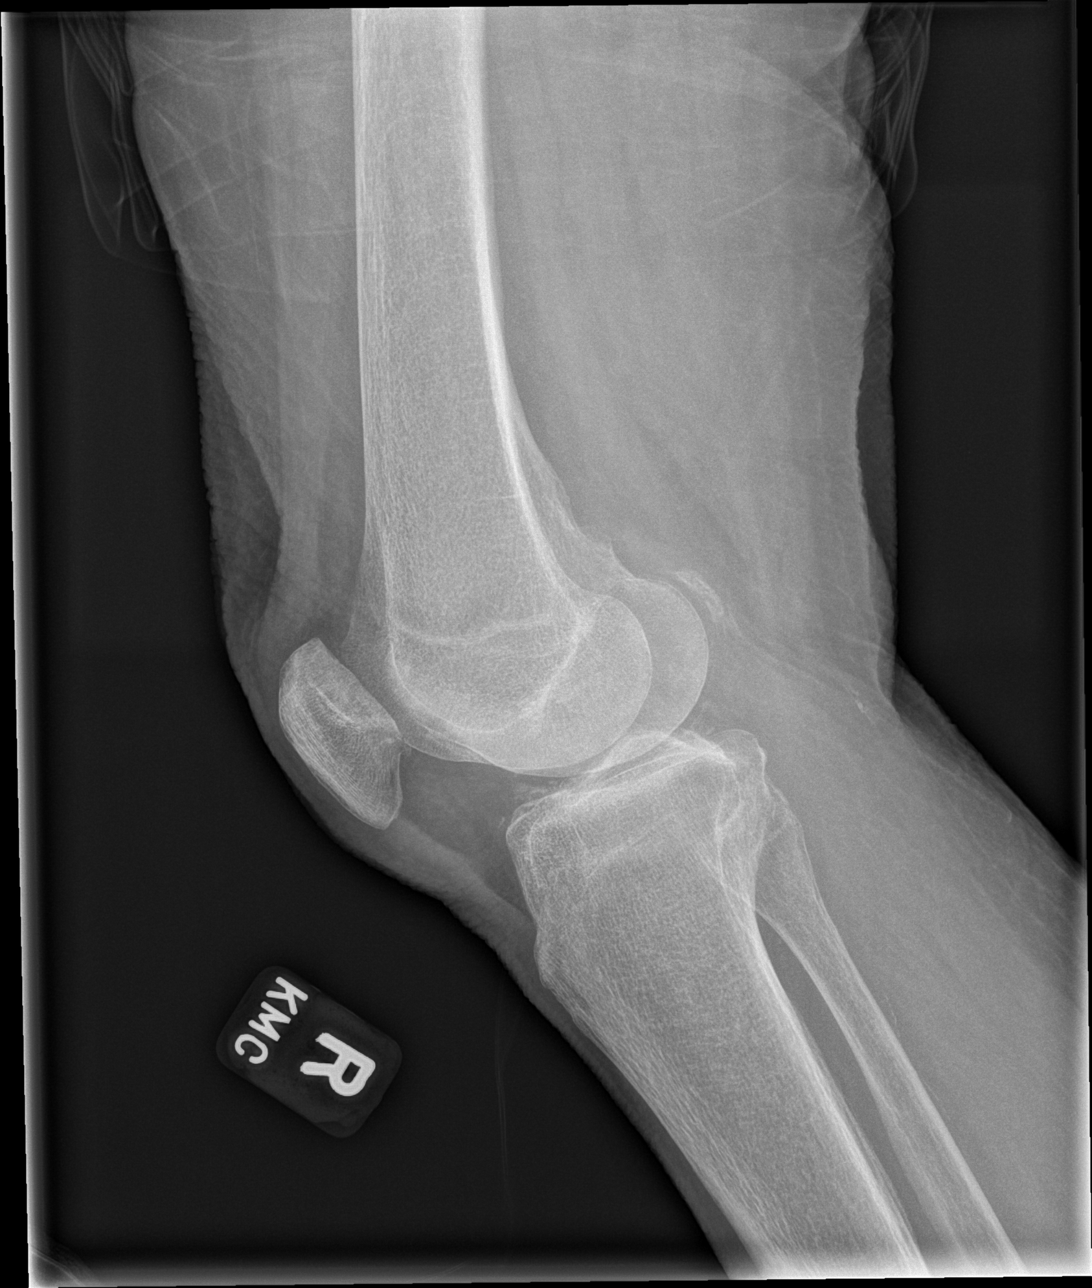

[t knee obl right (1 of 3)]
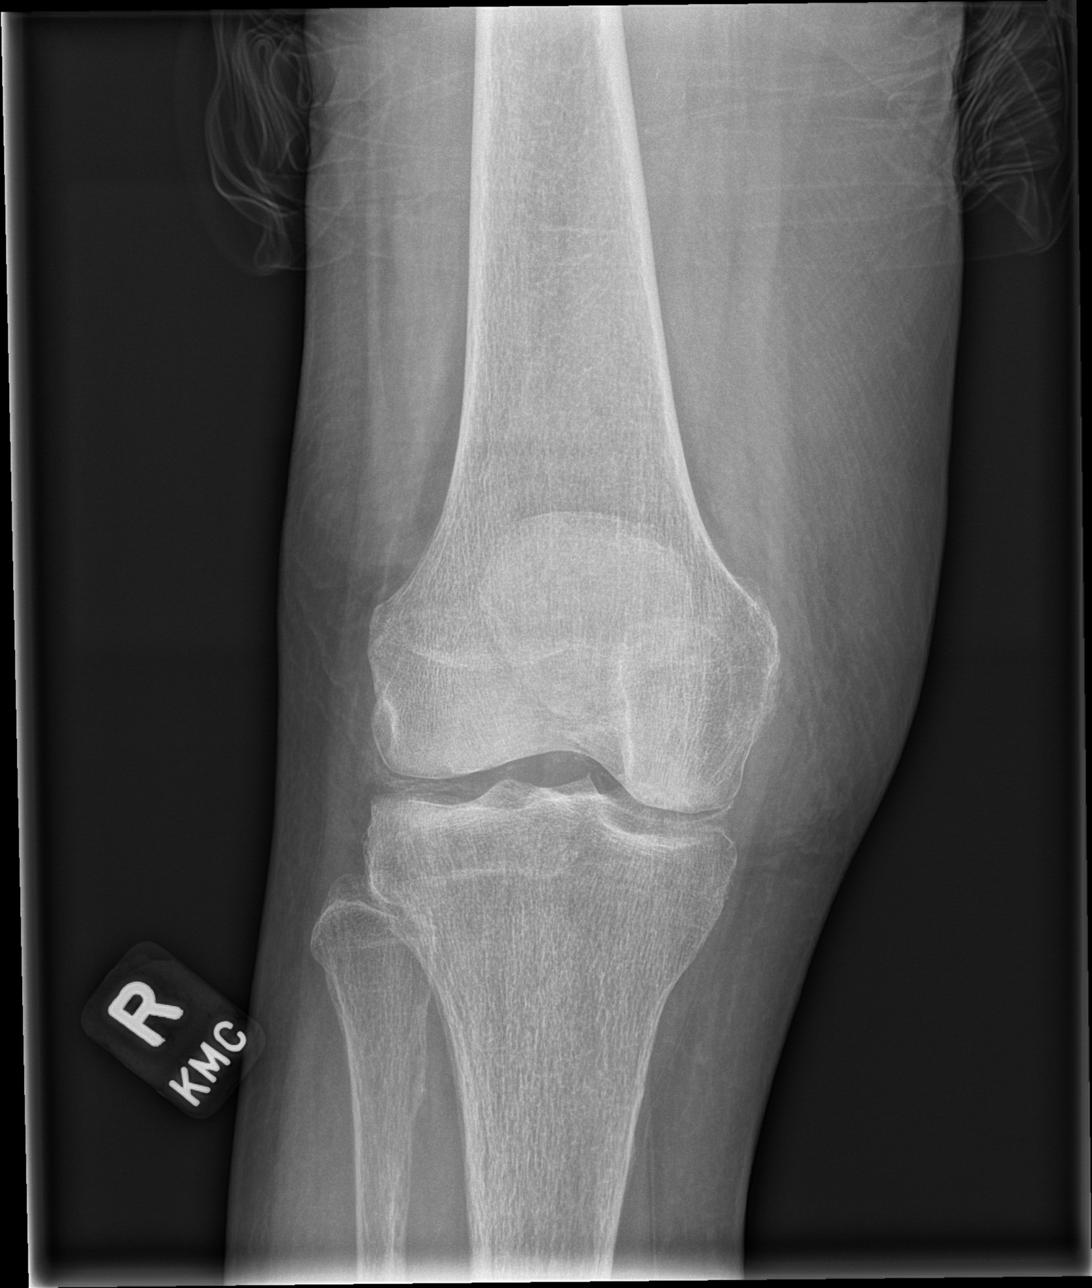

[t knee obl right (2 of 3)]
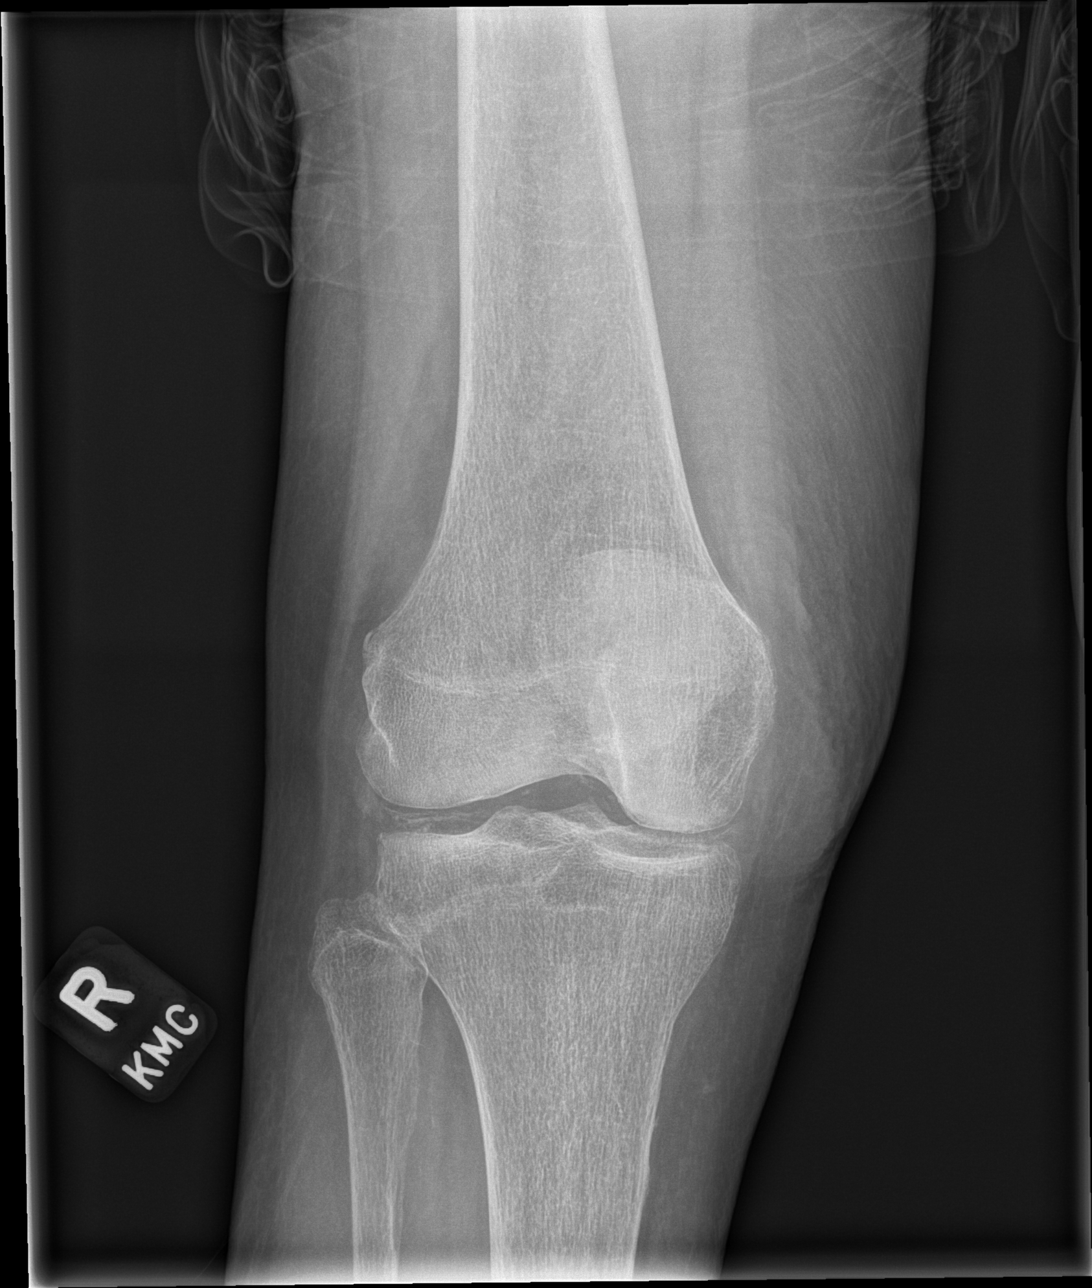

[t knee obl right (3 of 3)]
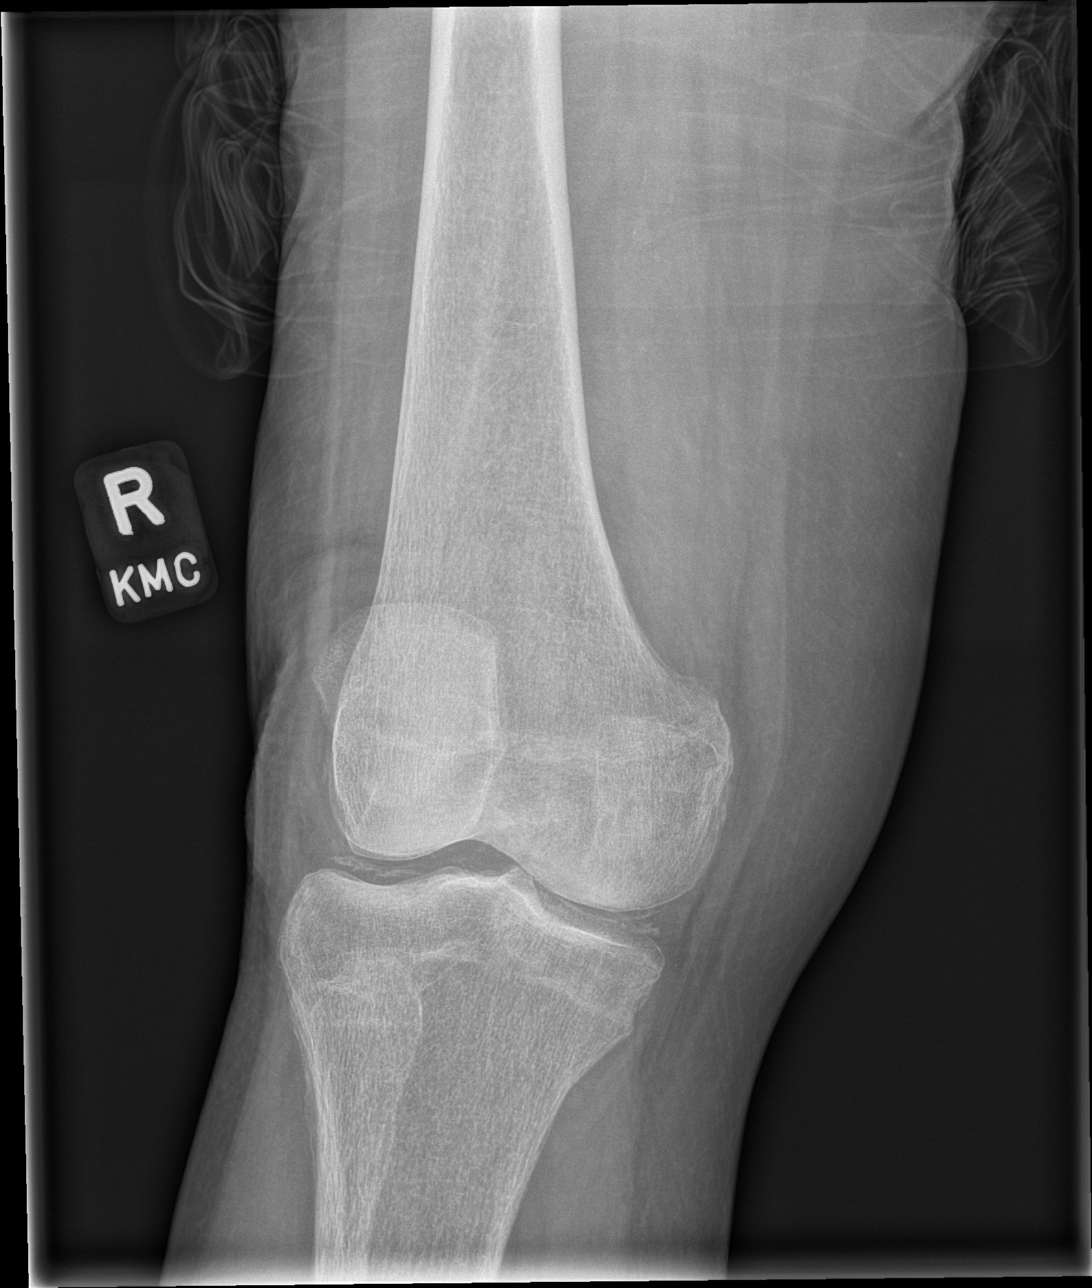

[4 of 4 positions shown; findings below may reference images not displayed]

FINDINGS: No evidence of fracture or dislocation. Likely trace joint
effusions. Tricompartmental degenerative changes, worst in the
medial compartment bilaterally. Meniscal calcifications bilaterally.
Soft tissues are otherwise unremarkable.
IMPRESSION: No acute fracture or dislocation. Tricompartmental degenerative
changes, with meniscal calcifications bilaterally, which can be due
to trauma, degenerative disease, or underlying [HOSPITAL] arthropathy.

## 2020-10-24 NOTE — Telephone Encounter (Signed)
Contacted daughter back, informed her Rx was since 10/22/20 to [facility], she appreciated call back.

## 2020-10-24 NOTE — Telephone Encounter (Signed)
Pt's daughter called requesting a call back to discuss pt's medication.

## 2020-11-28 ENCOUNTER — Emergency Department (HOSPITAL_COMMUNITY): Payer: Medicare Other

## 2020-11-28 ENCOUNTER — Inpatient Hospital Stay (HOSPITAL_COMMUNITY): Payer: Medicare Other

## 2020-11-28 ENCOUNTER — Encounter (HOSPITAL_COMMUNITY): Payer: Self-pay

## 2020-11-28 ENCOUNTER — Other Ambulatory Visit: Payer: Self-pay

## 2020-11-28 ENCOUNTER — Inpatient Hospital Stay (HOSPITAL_COMMUNITY)
Admission: EM | Admit: 2020-11-28 | Discharge: 2020-12-09 | DRG: 064 | Disposition: A | Discharge status: Skilled Nursing Facility | Payer: Medicare Other | Attending: Internal Medicine | Admitting: Internal Medicine

## 2020-11-28 DIAGNOSIS — Z79899 Other long term (current) drug therapy: Secondary | ICD-10-CM | POA: Diagnosis not present

## 2020-11-28 DIAGNOSIS — I10 Essential (primary) hypertension: Secondary | ICD-10-CM | POA: Diagnosis present

## 2020-11-28 DIAGNOSIS — R296 Repeated falls: Secondary | ICD-10-CM | POA: Diagnosis present

## 2020-11-28 DIAGNOSIS — R410 Disorientation, unspecified: Secondary | ICD-10-CM

## 2020-11-28 DIAGNOSIS — J1282 Pneumonia due to coronavirus disease 2019: Secondary | ICD-10-CM | POA: Diagnosis present

## 2020-11-28 DIAGNOSIS — Z8679 Personal history of other diseases of the circulatory system: Secondary | ICD-10-CM | POA: Diagnosis not present

## 2020-11-28 DIAGNOSIS — N390 Urinary tract infection, site not specified: Secondary | ICD-10-CM | POA: Diagnosis not present

## 2020-11-28 DIAGNOSIS — R4182 Altered mental status, unspecified: Secondary | ICD-10-CM | POA: Diagnosis present

## 2020-11-28 DIAGNOSIS — I63432 Cerebral infarction due to embolism of left posterior cerebral artery: Secondary | ICD-10-CM | POA: Diagnosis not present

## 2020-11-28 DIAGNOSIS — U071 COVID-19: Secondary | ICD-10-CM | POA: Diagnosis present

## 2020-11-28 DIAGNOSIS — R0602 Shortness of breath: Secondary | ICD-10-CM

## 2020-11-28 DIAGNOSIS — N3000 Acute cystitis without hematuria: Secondary | ICD-10-CM | POA: Diagnosis present

## 2020-11-28 DIAGNOSIS — I639 Cerebral infarction, unspecified: Secondary | ICD-10-CM | POA: Diagnosis not present

## 2020-11-28 DIAGNOSIS — Z8673 Personal history of transient ischemic attack (TIA), and cerebral infarction without residual deficits: Secondary | ICD-10-CM

## 2020-11-28 DIAGNOSIS — E785 Hyperlipidemia, unspecified: Secondary | ICD-10-CM | POA: Diagnosis present

## 2020-11-28 DIAGNOSIS — R29703 NIHSS score 3: Secondary | ICD-10-CM | POA: Diagnosis present

## 2020-11-28 DIAGNOSIS — Z7982 Long term (current) use of aspirin: Secondary | ICD-10-CM | POA: Diagnosis not present

## 2020-11-28 DIAGNOSIS — I6389 Other cerebral infarction: Secondary | ICD-10-CM | POA: Diagnosis not present

## 2020-11-28 DIAGNOSIS — Z87891 Personal history of nicotine dependence: Secondary | ICD-10-CM

## 2020-11-28 DIAGNOSIS — F0390 Unspecified dementia without behavioral disturbance: Secondary | ICD-10-CM | POA: Diagnosis present

## 2020-11-28 DIAGNOSIS — Z90710 Acquired absence of both cervix and uterus: Secondary | ICD-10-CM

## 2020-11-28 DIAGNOSIS — Z1389 Encounter for screening for other disorder: Secondary | ICD-10-CM

## 2020-11-28 DIAGNOSIS — I63532 Cerebral infarction due to unspecified occlusion or stenosis of left posterior cerebral artery: Principal | ICD-10-CM | POA: Diagnosis present

## 2020-11-28 LAB — BASIC METABOLIC PANEL
Anion gap: 11 (ref 5–15)
BUN: 9 mg/dL (ref 8–23)
CO2: 22 mmol/L (ref 22–32)
Calcium: 8.8 mg/dL — ABNORMAL LOW (ref 8.9–10.3)
Chloride: 98 mmol/L (ref 98–111)
Creatinine, Ser: 0.45 mg/dL (ref 0.44–1.00)
GFR, Estimated: >60 mL/min (ref >60)
Glucose, Bld: 116 mg/dL — ABNORMAL HIGH (ref 70–99)
Potassium: 3.7 mmol/L (ref 3.5–5.1)
Sodium: 131 mmol/L — ABNORMAL LOW (ref 135–145)

## 2020-11-28 LAB — URINALYSIS, ROUTINE W REFLEX MICROSCOPIC
Bacteria, UA: NONE SEEN
Bilirubin Urine: NEGATIVE
Glucose, UA: NEGATIVE mg/dL
Hgb urine dipstick: NEGATIVE
Ketones, ur: 20 mg/dL — AB
Nitrite: NEGATIVE
Protein, ur: NEGATIVE mg/dL
Specific Gravity, Urine: 1.011 (ref 1.005–1.030)
pH: 6 (ref 5.0–8.0)

## 2020-11-28 LAB — CBC WITH DIFFERENTIAL/PLATELET
Abs Immature Granulocytes: 0.04 10*3/uL (ref 0.00–0.07)
Basophils Absolute: 0 10*3/uL (ref 0.0–0.1)
Basophils Relative: 0 %
Eosinophils Absolute: 0 10*3/uL (ref 0.0–0.5)
Eosinophils Relative: 0 %
HCT: 35.4 % — ABNORMAL LOW (ref 36.0–46.0)
Hemoglobin: 11.9 g/dL — ABNORMAL LOW (ref 12.0–15.0)
Immature Granulocytes: 0 %
Lymphocytes Relative: 13 %
Lymphs Abs: 1.3 10*3/uL (ref 0.7–4.0)
MCH: 30.7 pg (ref 26.0–34.0)
MCHC: 33.6 g/dL (ref 30.0–36.0)
MCV: 91.5 fL (ref 80.0–100.0)
Monocytes Absolute: 1.1 10*3/uL — ABNORMAL HIGH (ref 0.1–1.0)
Monocytes Relative: 11 %
Neutro Abs: 7.5 10*3/uL (ref 1.7–7.7)
Neutrophils Relative %: 76 %
Platelets: 250 10*3/uL (ref 150–400)
RBC: 3.87 MIL/uL (ref 3.87–5.11)
RDW: 12.4 % (ref 11.5–15.5)
WBC: 10 10*3/uL (ref 4.0–10.5)
nRBC: 0 % (ref 0.0–0.2)

## 2020-11-28 LAB — CBC
HCT: 38.8 % (ref 36.0–46.0)
Hemoglobin: 12.1 g/dL (ref 12.0–15.0)
MCH: 30.9 pg (ref 26.0–34.0)
MCHC: 31.2 g/dL (ref 30.0–36.0)
MCV: 99 fL (ref 80.0–100.0)
Platelets: 259 10*3/uL (ref 150–400)
RBC: 3.92 MIL/uL (ref 3.87–5.11)
RDW: 12.3 % (ref 11.5–15.5)
WBC: 8 10*3/uL (ref 4.0–10.5)
nRBC: 0 % (ref 0.0–0.2)

## 2020-11-28 LAB — RESP PANEL BY RT-PCR (FLU A&B, COVID) ARPGX2
Influenza A by PCR: NEGATIVE
Influenza B by PCR: NEGATIVE
SARS Coronavirus 2 by RT PCR: POSITIVE — AB

## 2020-11-28 LAB — CREATININE, SERUM
Creatinine, Ser: 0.46 mg/dL (ref 0.44–1.00)
GFR, Estimated: >60 mL/min (ref >60)

## 2020-11-28 LAB — D-DIMER, QUANTITATIVE: D-Dimer, Quant: 1.45 ug/mL-FEU — ABNORMAL HIGH (ref 0.00–0.50)

## 2020-11-28 LAB — TROPONIN I (HIGH SENSITIVITY): Troponin I (High Sensitivity): 5 ng/L (ref <18)

## 2020-11-28 LAB — OSMOLALITY: Osmolality: 279 mOsm/kg (ref 275–295)

## 2020-11-28 LAB — C-REACTIVE PROTEIN: CRP: 11.5 mg/dL — ABNORMAL HIGH (ref <1.0)

## 2020-11-28 LAB — URIC ACID: Uric Acid, Serum: 5.6 mg/dL (ref 2.5–7.1)

## 2020-11-28 IMAGING — MR MR MRA HEAD W/O CM
1 series · 20 of 48 positions shown · non-contrast
Comparison: No pertinent prior exam.

CLINICAL DATA: Acute neurologic deficit

EXAM:
MRA HEAD WITHOUT CONTRAST
TECHNIQUE: Angiographic images of the Circle of Willis were acquired using MRA
technique without intravenous contrast.

[Series 5: 3d cow · axial · 0.5mm · 0.41mm/px · z∈[-118,-36]mm · 20 of 172 slices shown]
[im 1/172]
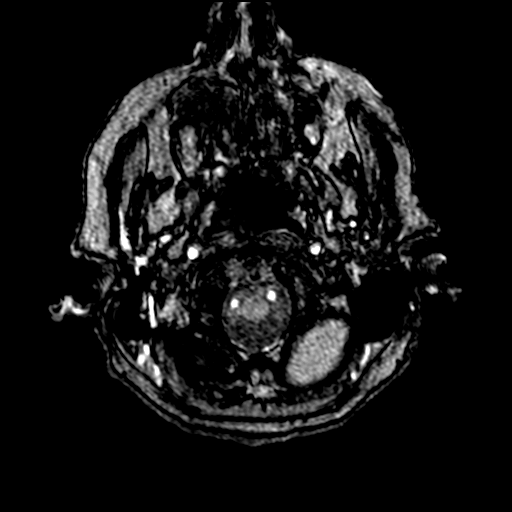
[im 4/172]
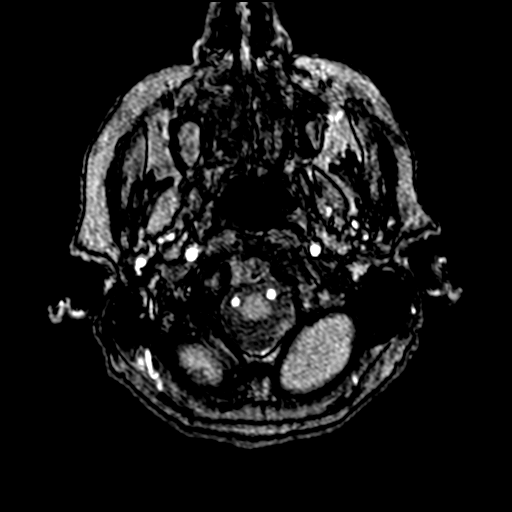
[im 8/172]
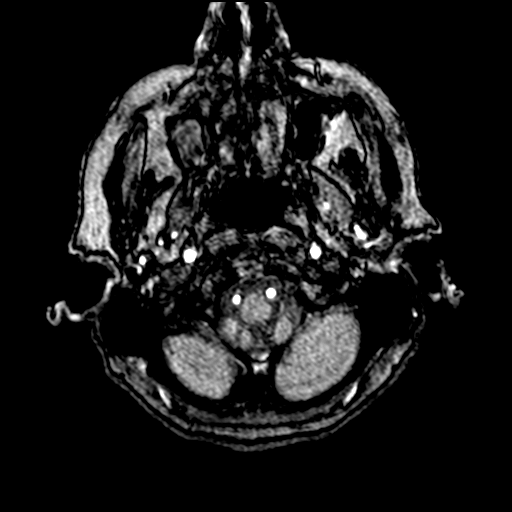
[im 11/172]
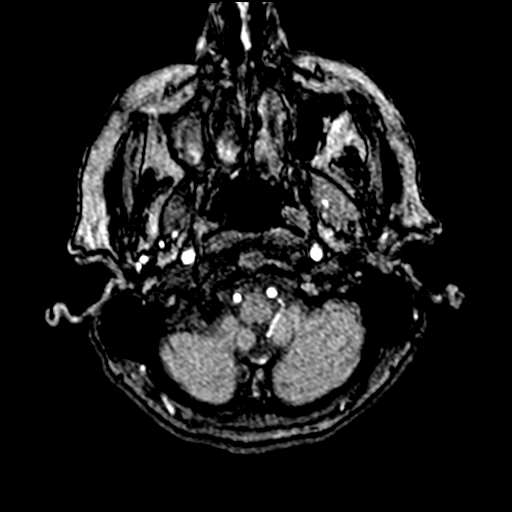
[im 15/172]
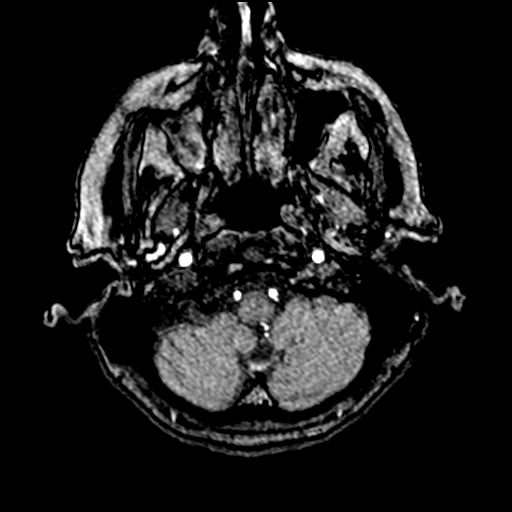
[im 19/172]
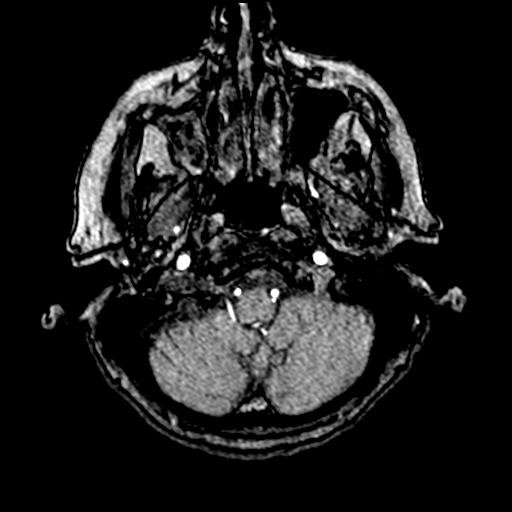
[im 22/172]
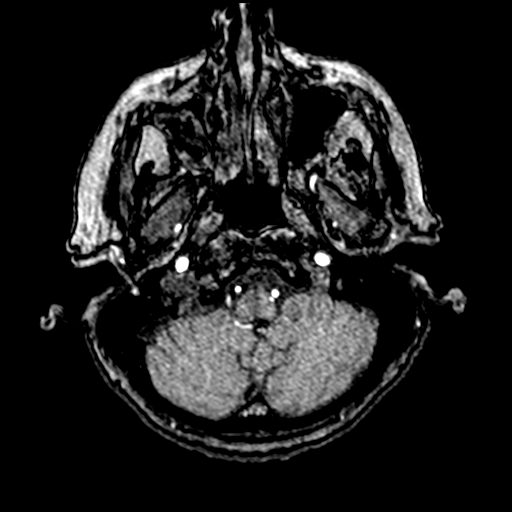
[im 26/172]
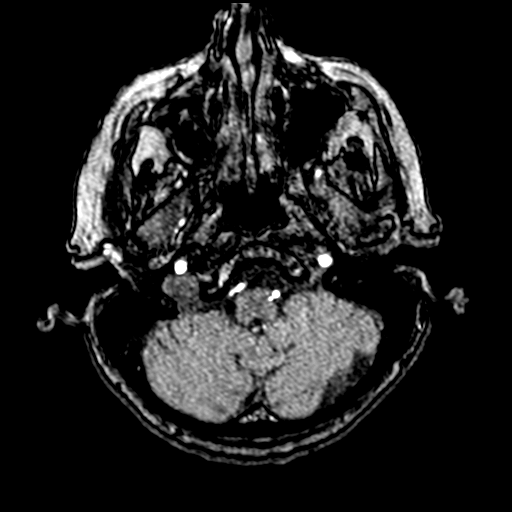
[im 30/172]
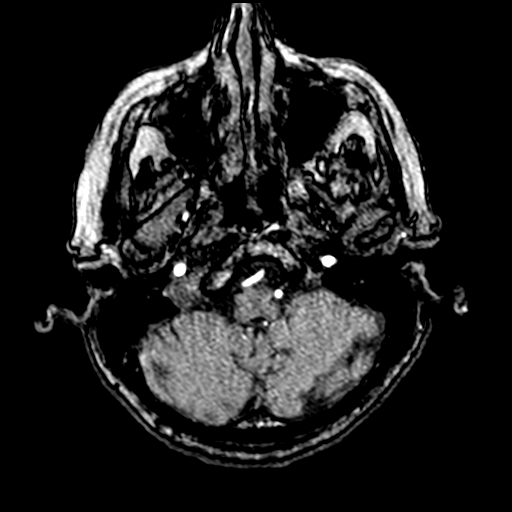
[im 33/172]
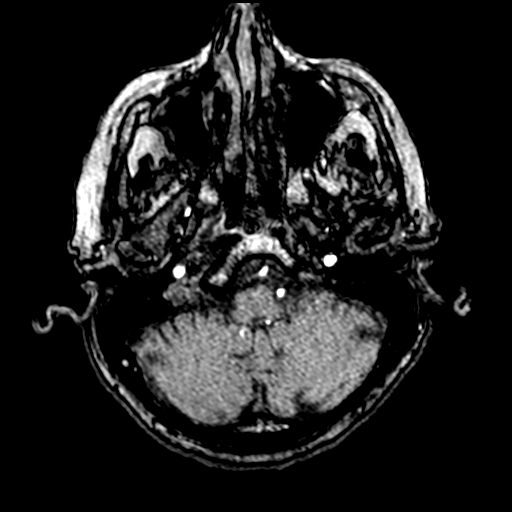
[im 37/172]
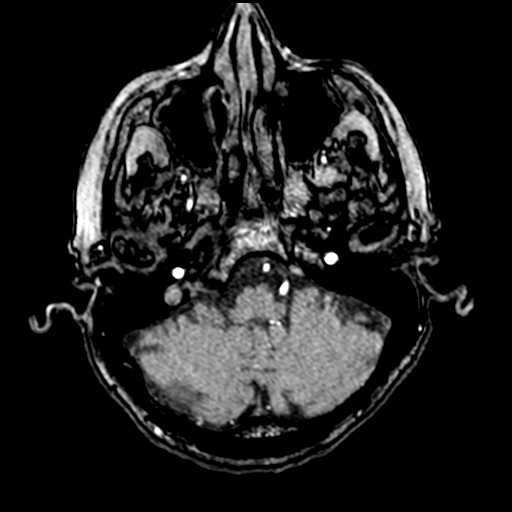
[im 41/172]
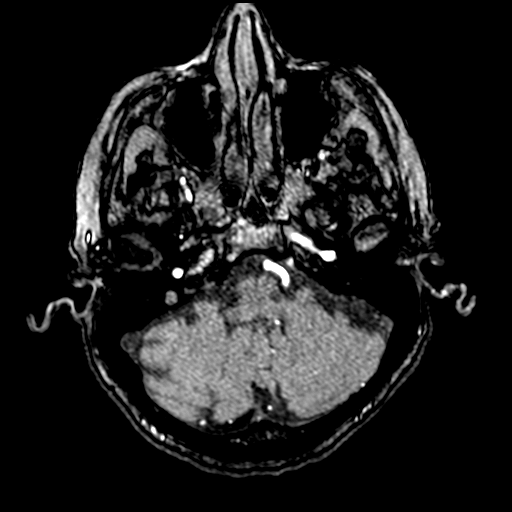
[im 55/172]
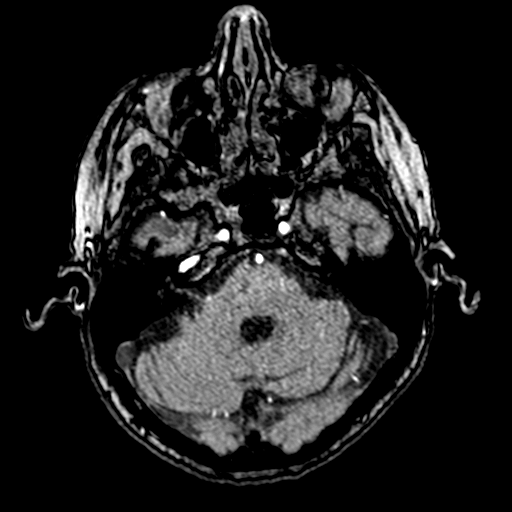
[im 77/172]
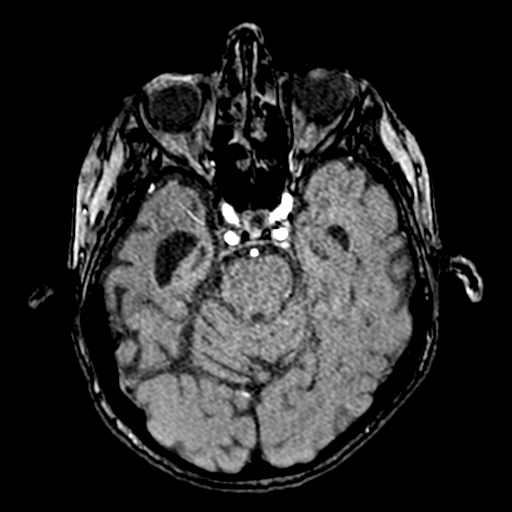
[im 88/172]
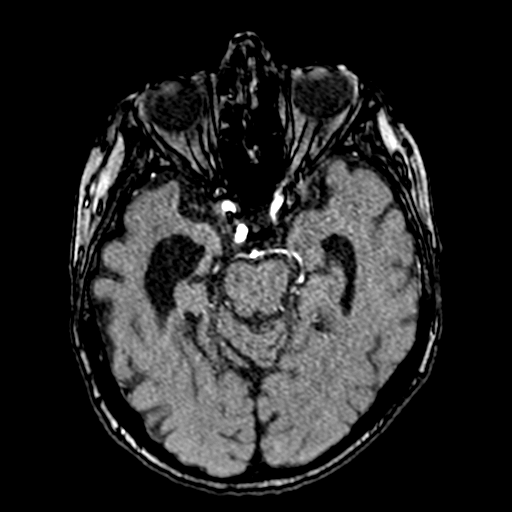
[im 99/172]
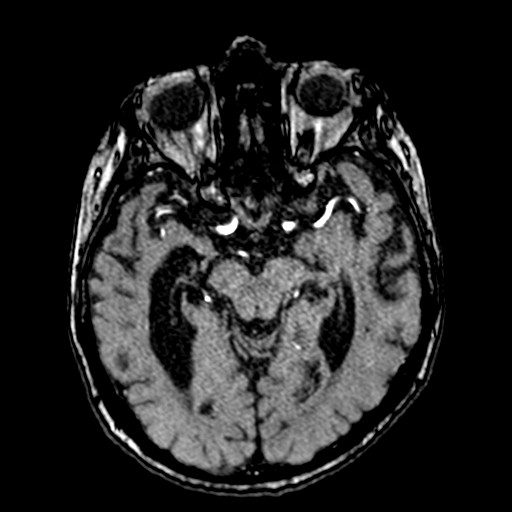
[im 121/172]
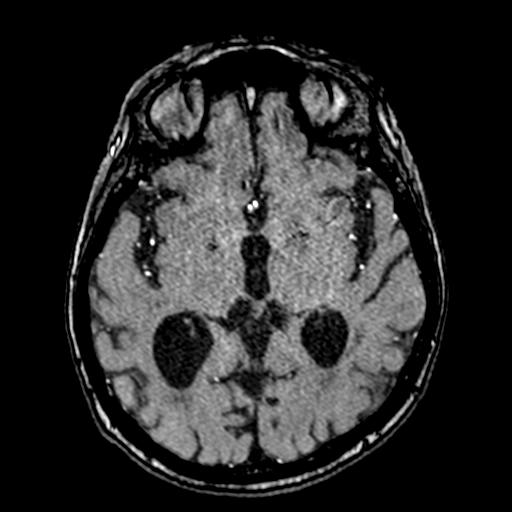
[im 142/172]
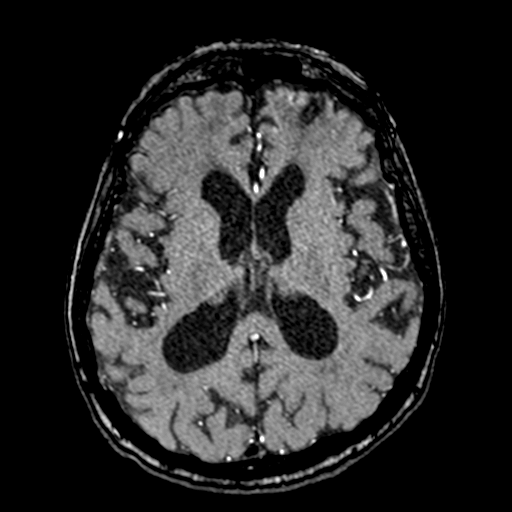
[im 146/172]
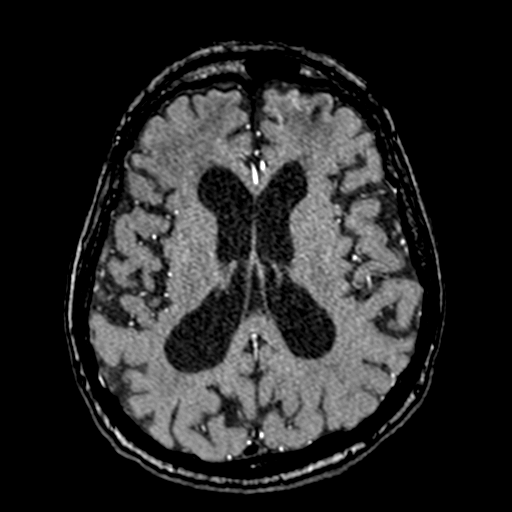
[im 164/172]
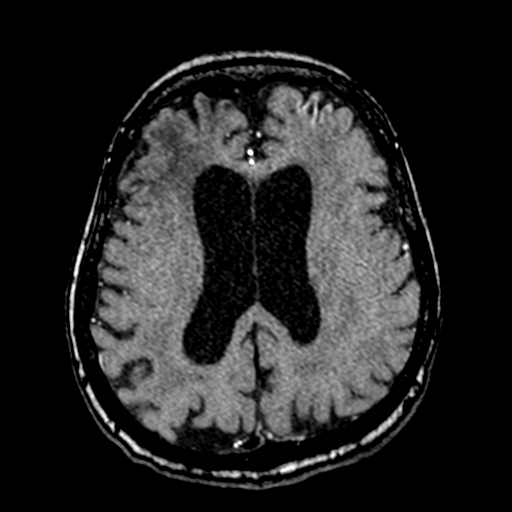

[20 of 48 positions shown; findings below may reference images not displayed]

FINDINGS: POSTERIOR CIRCULATION:

--Vertebral arteries: Normal

--Inferior cerebellar arteries: Normal.

--Basilar artery: Normal.

--Superior cerebellar arteries: Normal.

--Posterior cerebral arteries: Mild narrowing of the right PCA P2
segment. Normal left.

ANTERIOR CIRCULATION:

--Intracranial internal carotid arteries: Normal.

--Anterior cerebral arteries (ACA): Normal.

--Middle cerebral arteries (MCA): Normal.

ANATOMIC VARIANTS: None
IMPRESSION: 1. No emergent large vessel occlusion or high-grade stenosis.
2. Mild narrowing of the right PCA P2 segment.

## 2020-11-28 IMAGING — CT CT HEAD W/O CM
3 series · 14 of 47 positions shown, 16 images · non-contrast
Comparison: MRI [DATE], CT [DATE]

CLINICAL DATA: Head trauma, minor (Age >= 65y) hx of dementia with
repeated falls recently, hematoma near right eye, evaluate for MAIKO

EXAM:
CT HEAD WITHOUT CONTRAST
TECHNIQUE: Contiguous axial images were obtained from the base of the skull
through the vertex without intravenous contrast.

[Series 3: head 5.0 h30s · axial · 0.41mm/px · z∈[-158,-18]mm · 8 of 34 slices shown, 10 images]
[im 3/34  brain]
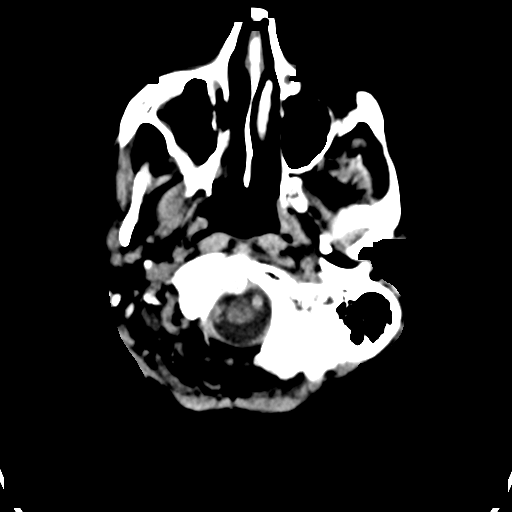
[im 3/34  bone]
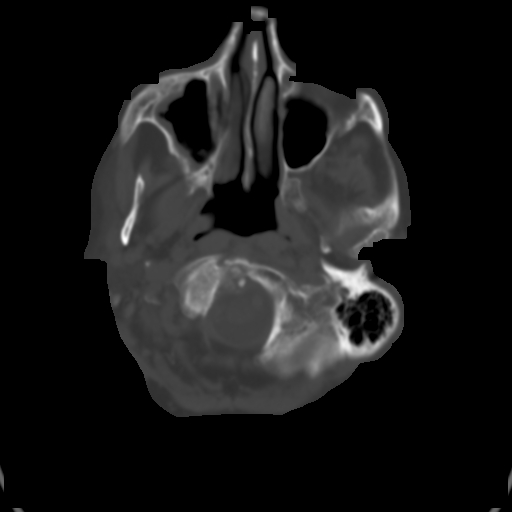
[im 7/34  brain]
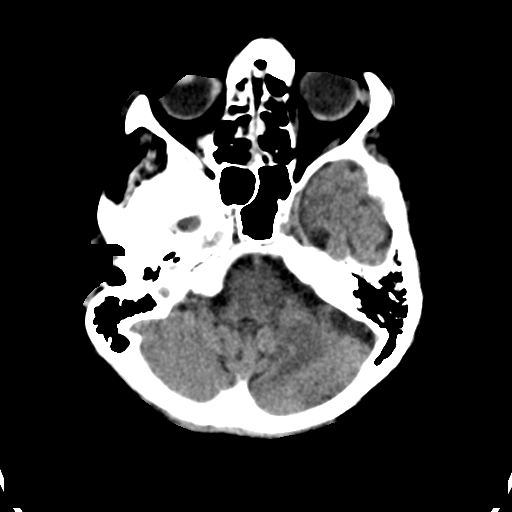
[im 11/34  brain]
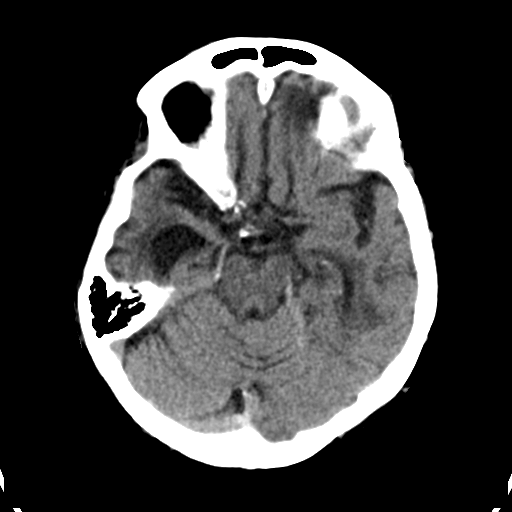
[im 15/34  brain]
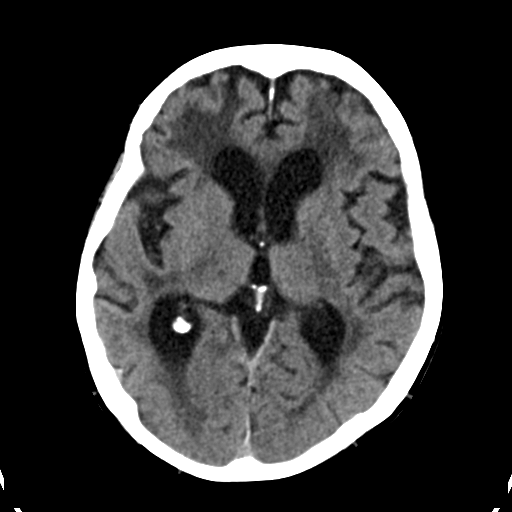
[im 19/34  brain]
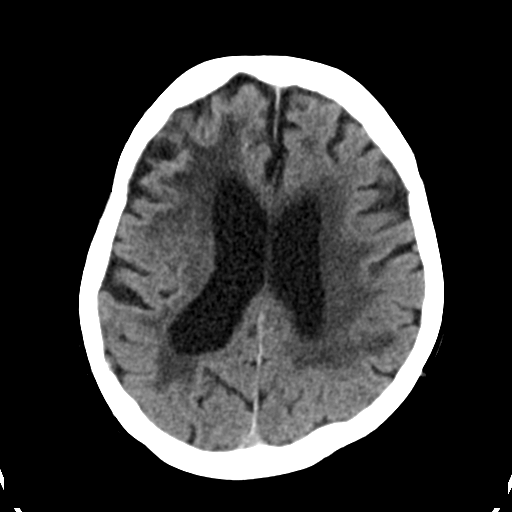
[im 19/34  bone]
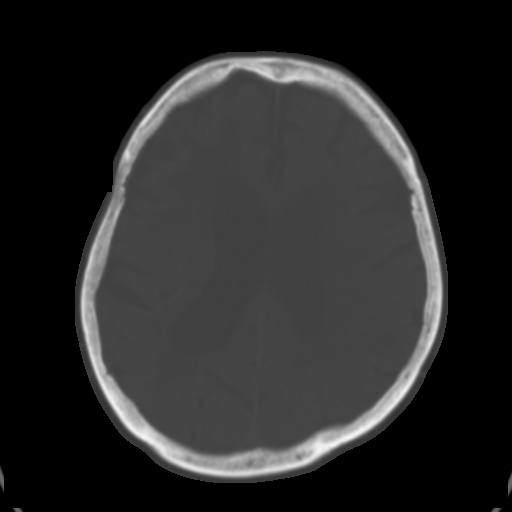
[im 23/34  brain]
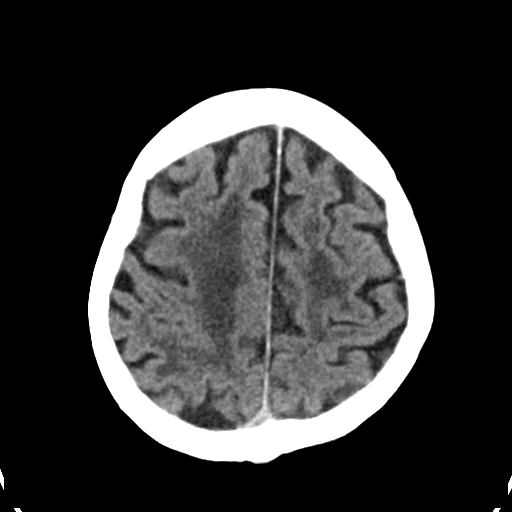
[im 27/34  brain]
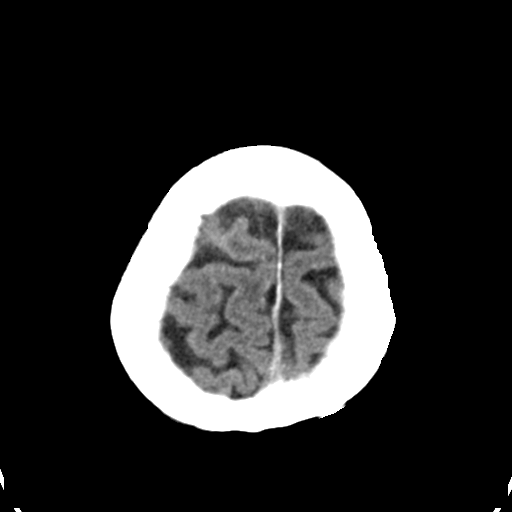
[im 31/34  brain]
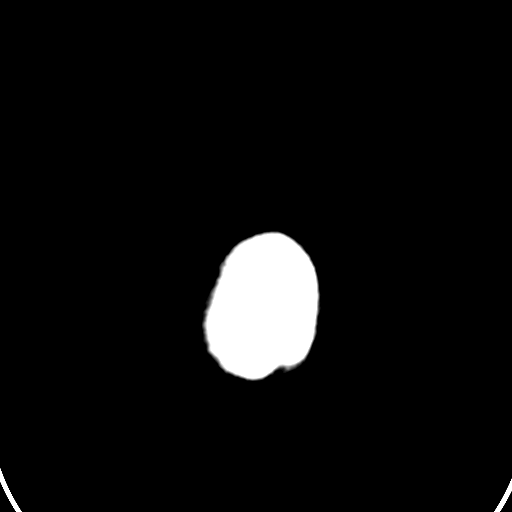

[Series 5: head 3.0 mpr cor · coronal · 0.33mm/px · 3 of 64 slices shown]
[im 22/64  brain]
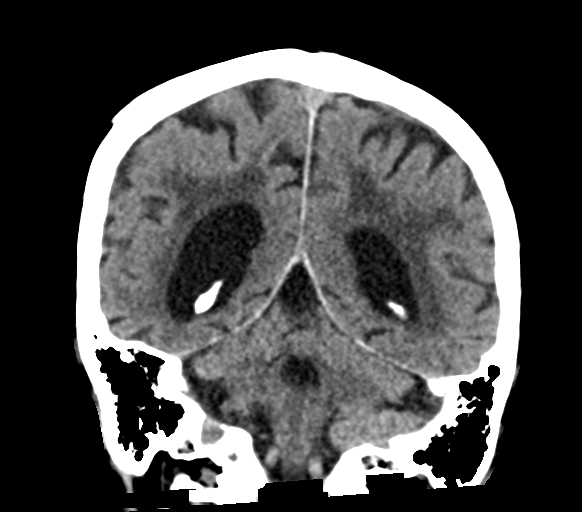
[im 29/64  brain]
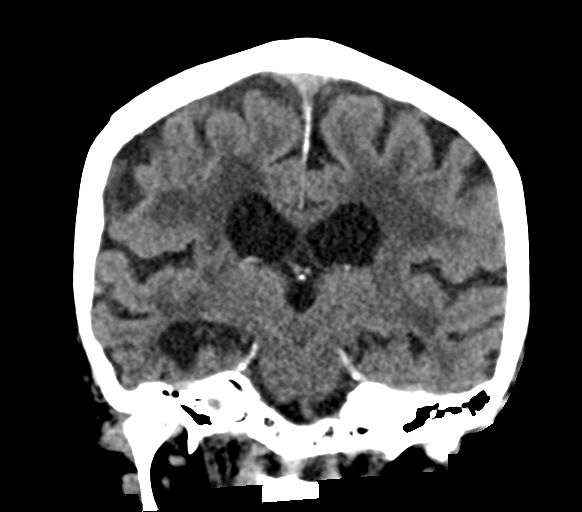
[im 36/64  brain]
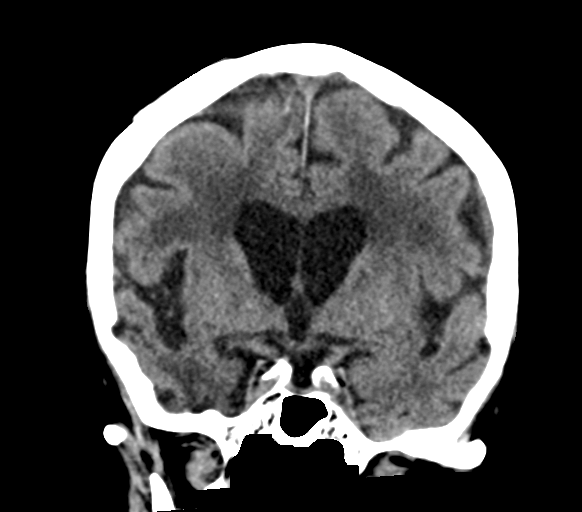

[Series 6: head 3.0 mpr sag · sagittal · 0.30mm/px · 3 of 65 slices shown]
[im 22/65  brain]
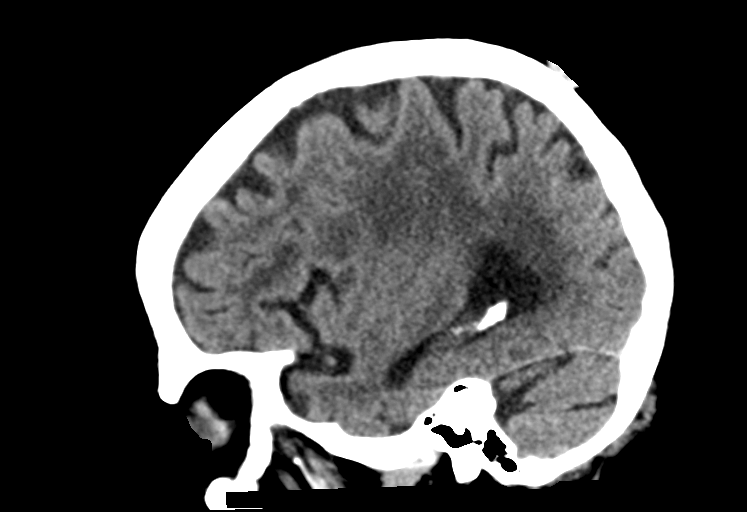
[im 33/65  brain]
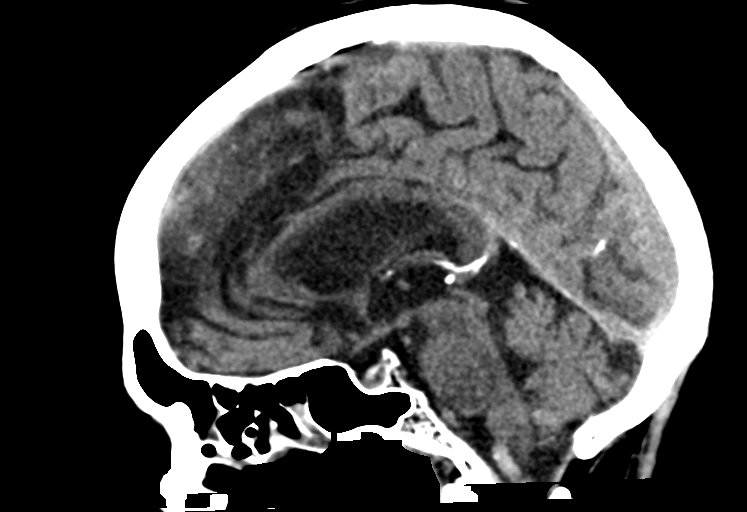
[im 43/65  brain]
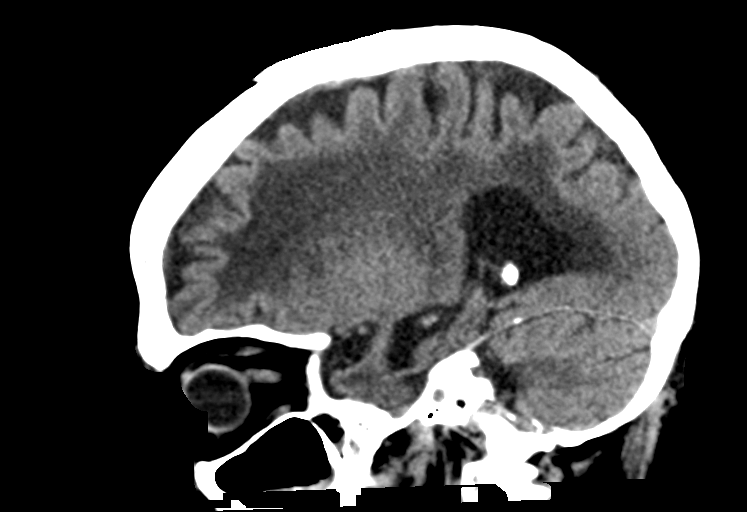

[14 of 47 positions shown; findings below may reference images not displayed]

FINDINGS: Brain: New area of low attenuation in the anteroinferior left
frontal lobe with loss of gray-white differentiation and developing
encephalomalacia is new from prior and likely represents a late
subacute or chronic left ACA territory infarction. Chronic right
anterior frontal lobe infarct is unchanged. No intracranial
hemorrhage. No extra-axial collection. Stable size and configuration
of the ventricles. No mass lesion or mass effect. Extensive
low-density changes within the periventricular and subcortical white
matter compatible with chronic microvascular ischemic change. Mild
diffuse cerebral volume loss.

Vascular: Atherosclerotic calcifications involving the large vessels
of the skull base. No unexpected hyperdense vessel.

Skull: Normal. Negative for fracture or focal lesion.

Sinuses/Orbits: Mucosal thickening and partial opacification of the
right maxillary sinus. There is extensive mucosal thickening within
the bilateral ethmoid air cells. Orbital structures within normal
limits.

Other: Right periorbital soft tissue swelling.
IMPRESSION: 1. Low-attenuation changes in the left frontal lobe is new from
prior and likely represents a late subacute to chronic left ACA
territory infarction. Further evaluation with MRI of the brain is
recommended.
2. No intracranial hemorrhage identified.
3. Chronic right anterior frontal lobe infarct is unchanged.
4. Right periorbital soft tissue swelling.
5. Paranasal sinus disease as described.

## 2020-11-28 IMAGING — DX DG CHEST 1V
1 series · 1 of 1 positions shown · non-contrast
Comparison: None.

CLINICAL DATA: Stroke.  Pre MRI metal screening

EXAM:
CHEST  1 VIEW; PELVIS - 1-2 VIEW; ABDOMEN - 1 VIEW

[chest ap]
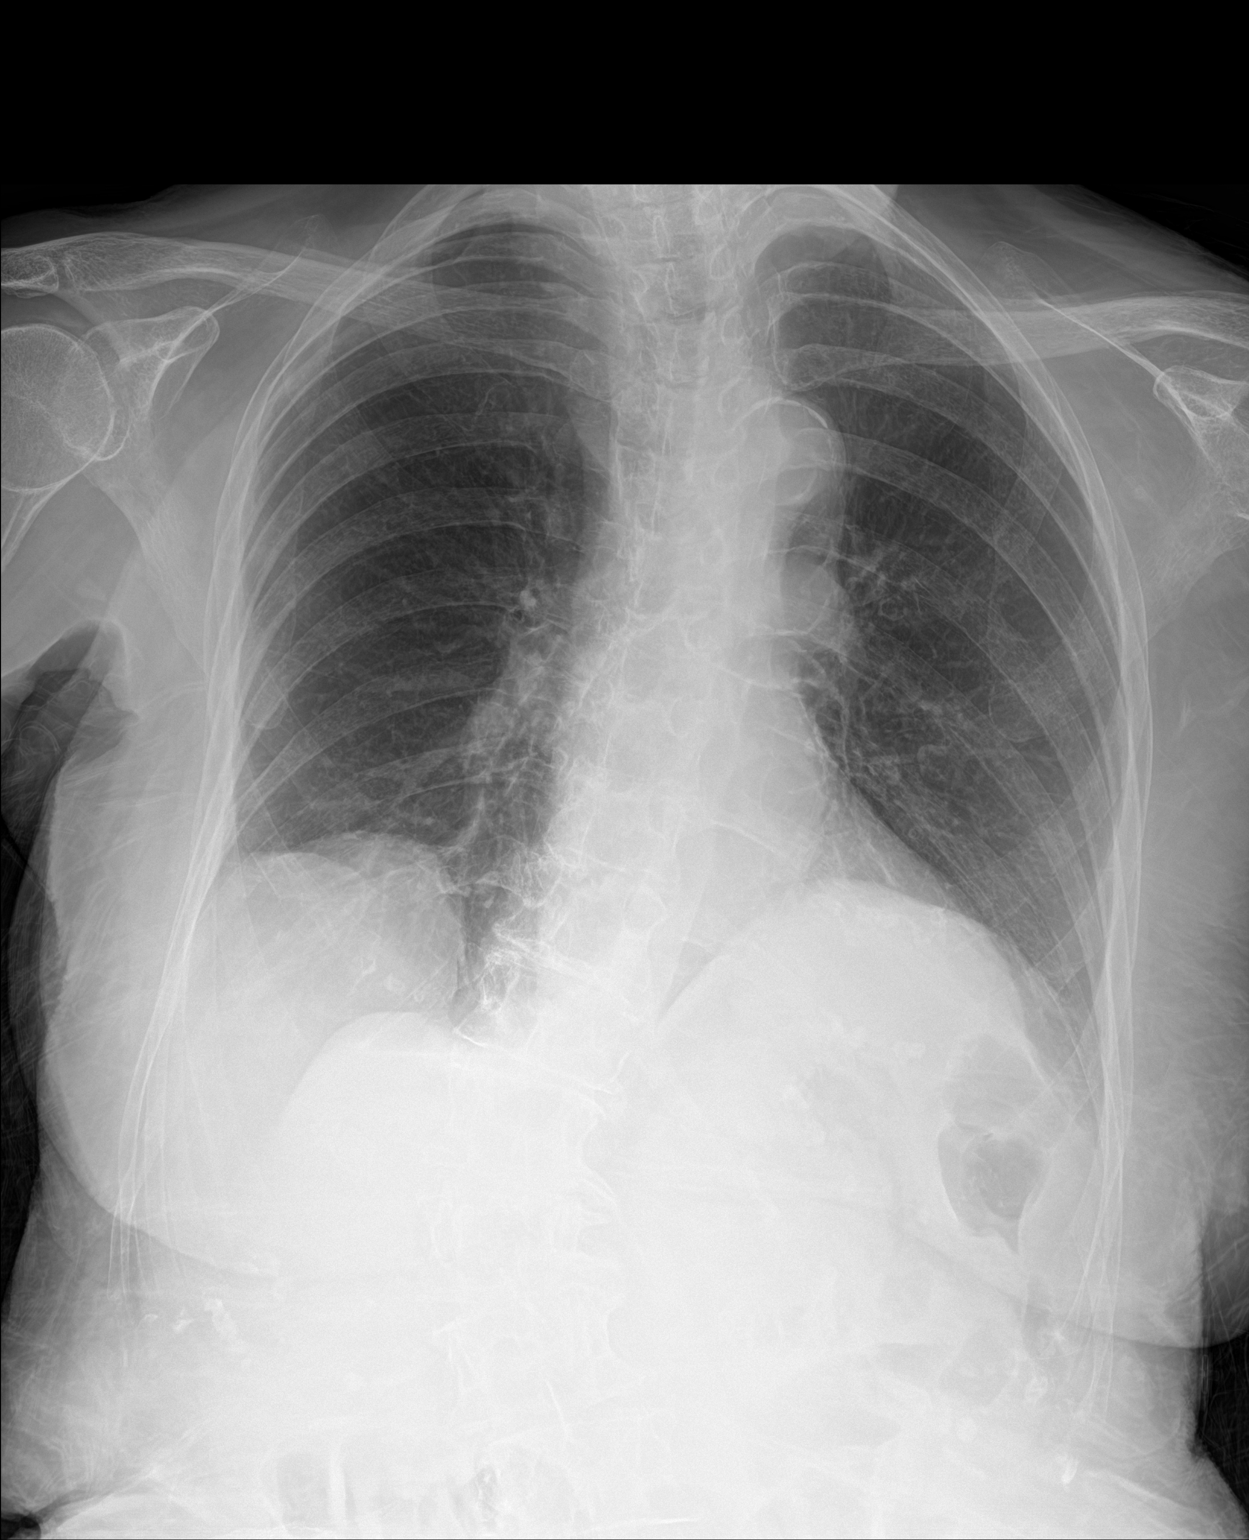

[1 of 1 positions shown; findings below may reference images not displayed]

FINDINGS: The heart and mediastinal contours are within normal limits. Aortic
calcification.

Likely eventration of the right hemidiaphragm. No focal
consolidation. No pulmonary edema. No pleural effusion. No
pneumothorax.

Nonobstructive bowel gas pattern.

No acute osseous abnormality. Multilevel degenerative changes of the
thoracolumbar spine. No retained radiopaque foreign body.
IMPRESSION: 1. No retained radiopaque foreign body.
2. No acute cardiopulmonary disease.
3. Nonobstructive bowel gas pattern.
4.  Aortic Atherosclerosis ([2U]-[2U]).

## 2020-11-28 IMAGING — MR MR HEAD W/O CM
8 of 10 series · 35 of 48 positions shown · non-contrast
Comparison: Prior head CT [DATE].  Brain MRI [DATE].

CLINICAL DATA: Stroke, follow-up; CT head with question of evolving
or new subacute ACA infarct, patient with history of dementia and
prior CVA, follow-up imaging.

EXAM:
MRI HEAD WITHOUT CONTRAST
TECHNIQUE: Multiplanar, multiecho pulse sequences of the brain and surrounding
structures were obtained without intravenous contrast.

[Series 3: DWI · axial · 3.0mm · 1.09mm/px · z∈[-23,+108]mm · 9 of 92 slices shown (1 of 4)]
[im 1/92]
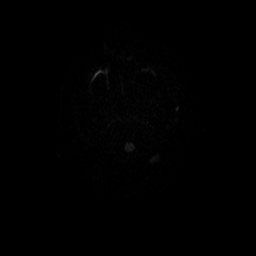
[im 12/92]
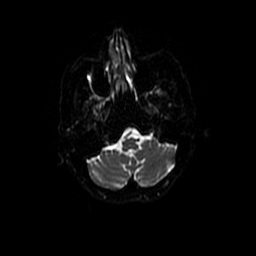
[im 23/92]
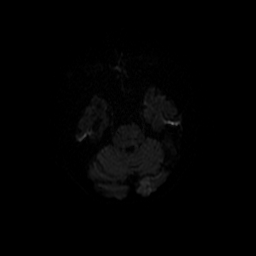
[im 35/92]
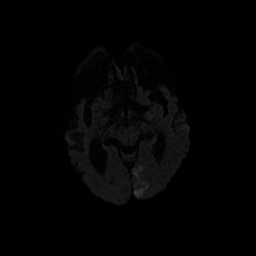
[im 46/92]
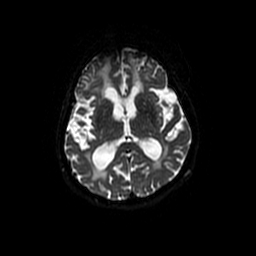
[im 57/92]
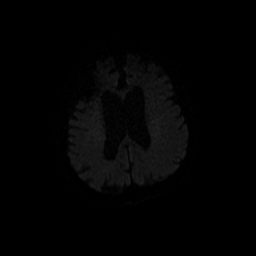
[im 69/92]
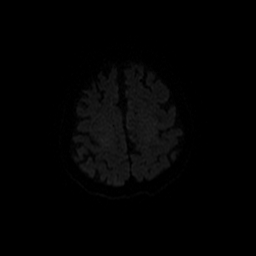
[im 80/92]
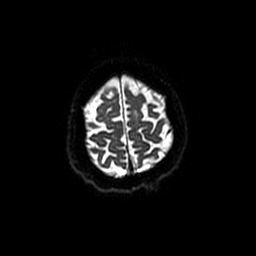
[im 92/92]
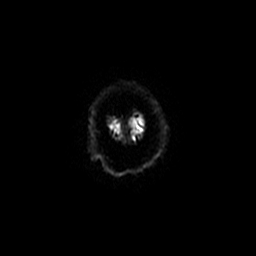

[Series 4: DWI · coronal · 5.0mm · 1.09mm/px · 6 of 66 slices shown (2 of 4)]
[im 1/66]
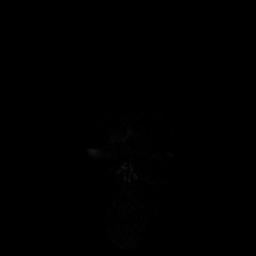
[im 14/66]
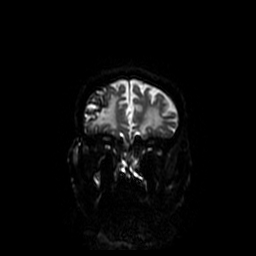
[im 27/66]
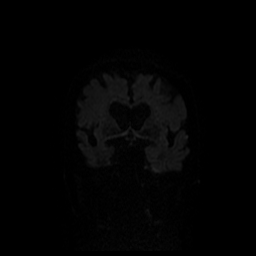
[im 40/66]
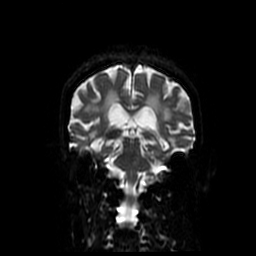
[im 53/66]
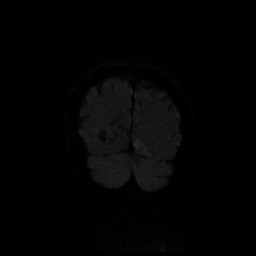
[im 66/66]
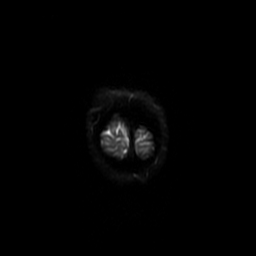

[Series 5: T1 · sagittal · 5.0mm · 0.47mm/px · 2 of 24 slices shown]
[im 1/24]
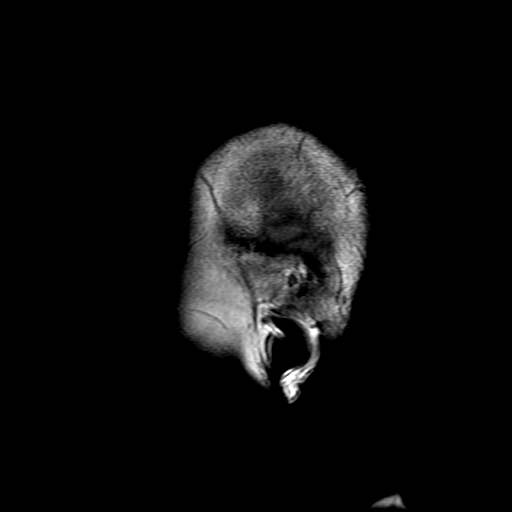
[im 12/24]
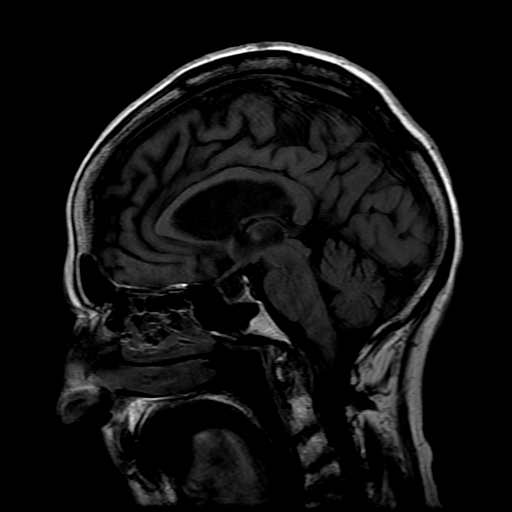

[Series 6: T2 · axial · 5.0mm · 0.43mm/px · z∈[-13,+120]mm · 3 of 24 slices shown (1 of 2)]
[im 1/24]
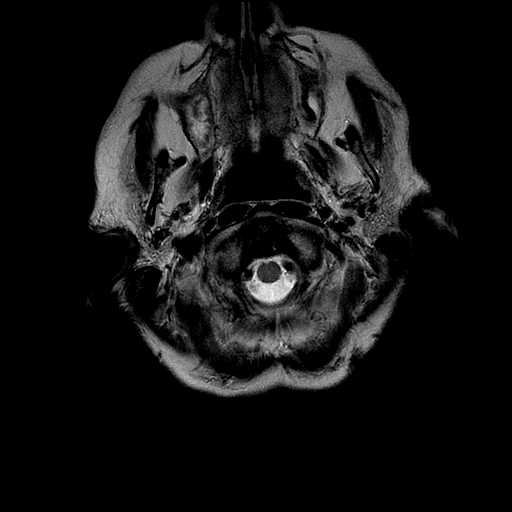
[im 12/24]
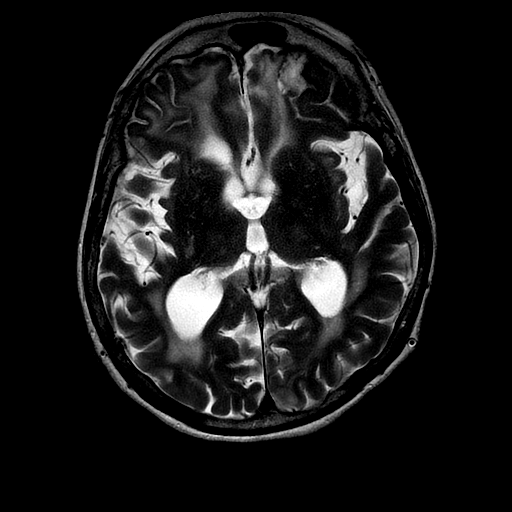
[im 24/24]
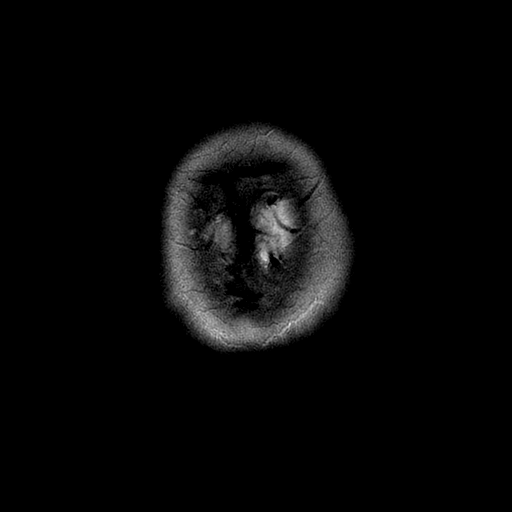

[Series 7: FLAIR · axial · 3.0mm · 0.43mm/px · z∈[-13,+120]mm · 3 of 24 slices shown]
[im 1/24]
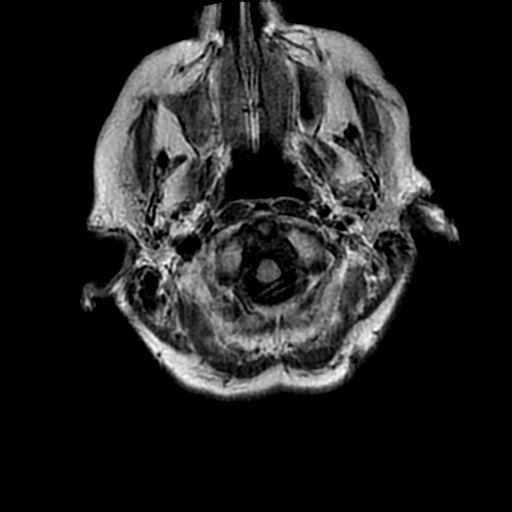
[im 12/24]
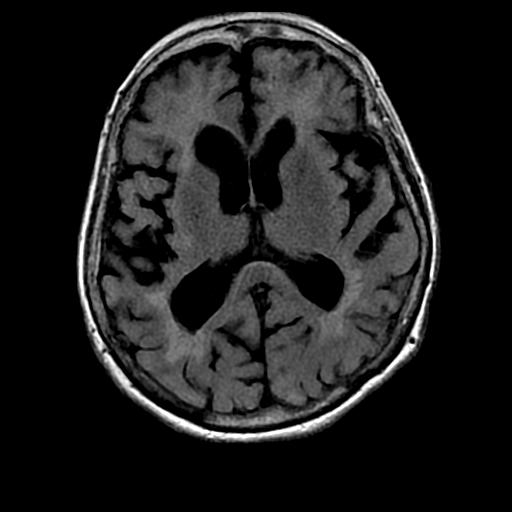
[im 24/24]
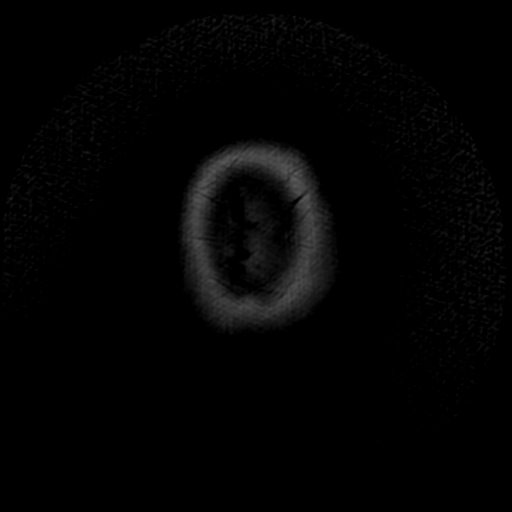

[Series 11: T2 · coronal · 5.0mm · 0.39mm/px · 3 of 25 slices shown (2 of 2)]
[im 1/25]
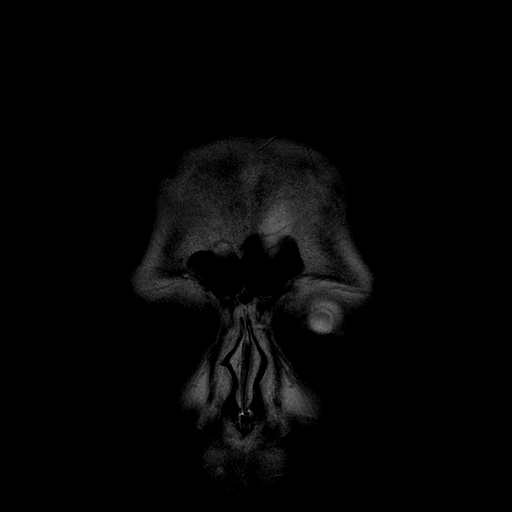
[im 13/25]
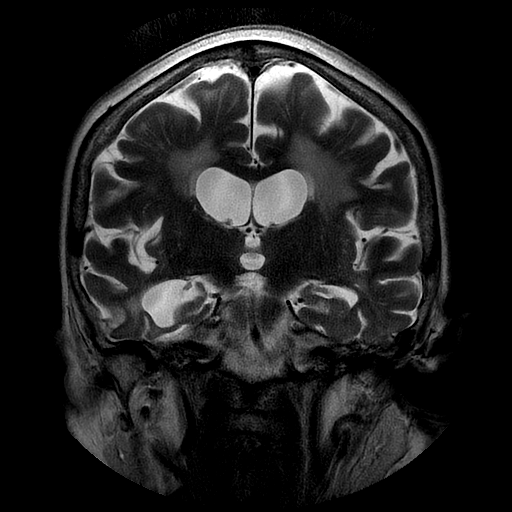
[im 25/25]
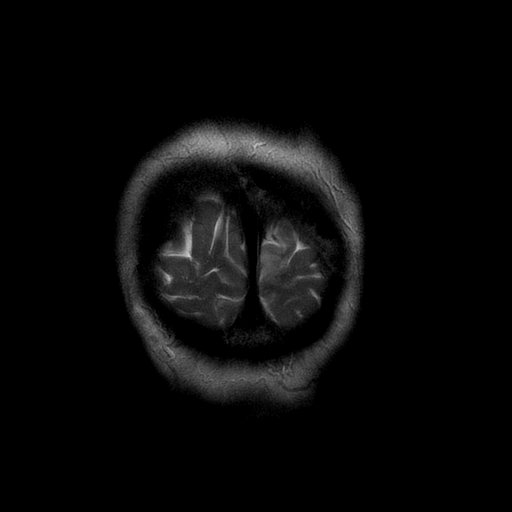

[Series 300: DWI · axial · 3.0mm · 1.09mm/px · z∈[-23,+108]mm · 5 of 46 slices shown (3 of 4)]
[im 1/46]
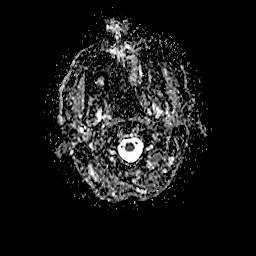
[im 12/46]
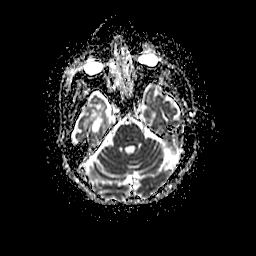
[im 23/46]
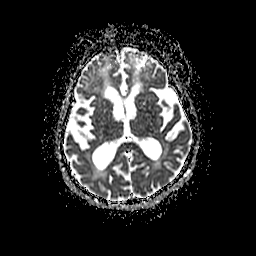
[im 34/46]
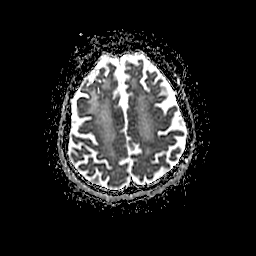
[im 46/46]
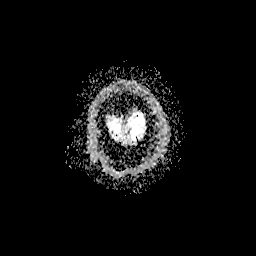

[Series 400: DWI · coronal · 5.0mm · 1.09mm/px · 4 of 33 slices shown (4 of 4)]
[im 1/33]
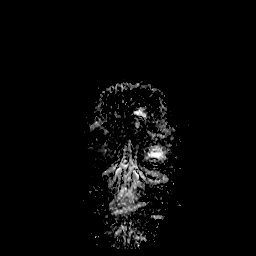
[im 11/33]
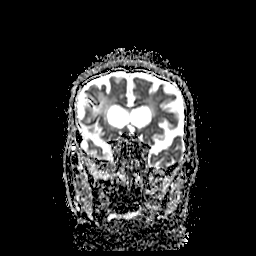
[im 22/33]
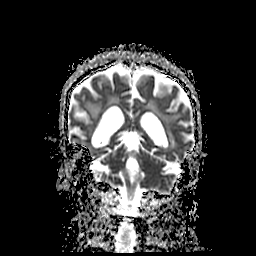
[im 33/33]
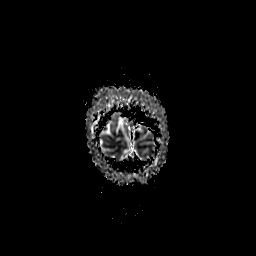

[35 of 48 positions shown; findings below may reference images not displayed]

FINDINGS: Brain:

Mild to moderate generalized cerebral atrophy. Comparatively mild
cerebellar atrophy.

Moderate-sized acute/early subacute infarct within the
cortical/subcortical left parietooccipital lobes and within the
callosal splenium (PCA vascular territory).

Redemonstrated chronic foci of encephalomalacia within the anterior
frontal lobes and anterior right temporal lobe, with associated
chronic blood products.

Background advanced patchy and confluent T2 FLAIR hyperintense
signal abnormality within the cerebral white matter, nonspecific but
compatible with chronic small vessel ischemic disease.

Mild chronic small vessel ischemic changes are also present within
the pons.

No evidence of an intracranial mass.

No extra-axial fluid collection.

No midline shift.

Partially empty sella turcica.

Vascular: No definite loss of expected proximal arterial flow voids.

Skull and upper cervical spine: No focal suspicious marrow lesion.
Incompletely assessed cervical spondylosis.

Sinuses/Orbits: Visualized orbits show no acute finding.
Mild-to-moderate mucosal thickening within the bilateral ethmoid and
right maxillary sinuses. Superimposed small volume frothy secretions
within the right maxillary sinus. Mild mucosal thickening also
present within the bilateral sphenoid and left maxillary sinuses.

Other: 13 mm left parietal scalp lesion, nonspecific but possibly
reflecting a sebaceous cyst.
IMPRESSION: Moderate-sized acute/early subacute left PCA territory infarct
affecting the cortical/subcortical left parietooccipital lobes and
callosal splenium.

Redemonstrated chronic encephalomalacia and associated chronic blood
products in the anterior frontal lobes, and anterior right temporal
lobe, presumably posttraumatic in etiology.

Chronic small-vessel ischemic changes which are severe in the
cerebral white matter, and mild in the pons.

Mild-to-moderate generalized cerebral atrophy. Comparatively mild
cerebellar atrophy.

Paranasal sinus disease, as described.

## 2020-11-28 IMAGING — DX DG ABDOMEN 1V
2 series · 2 of 2 positions shown · non-contrast
Comparison: None.

CLINICAL DATA: Stroke.  Pre MRI metal screening

EXAM:
CHEST  1 VIEW; PELVIS - 1-2 VIEW; ABDOMEN - 1 VIEW

[abdomen kub (1 of 2)]
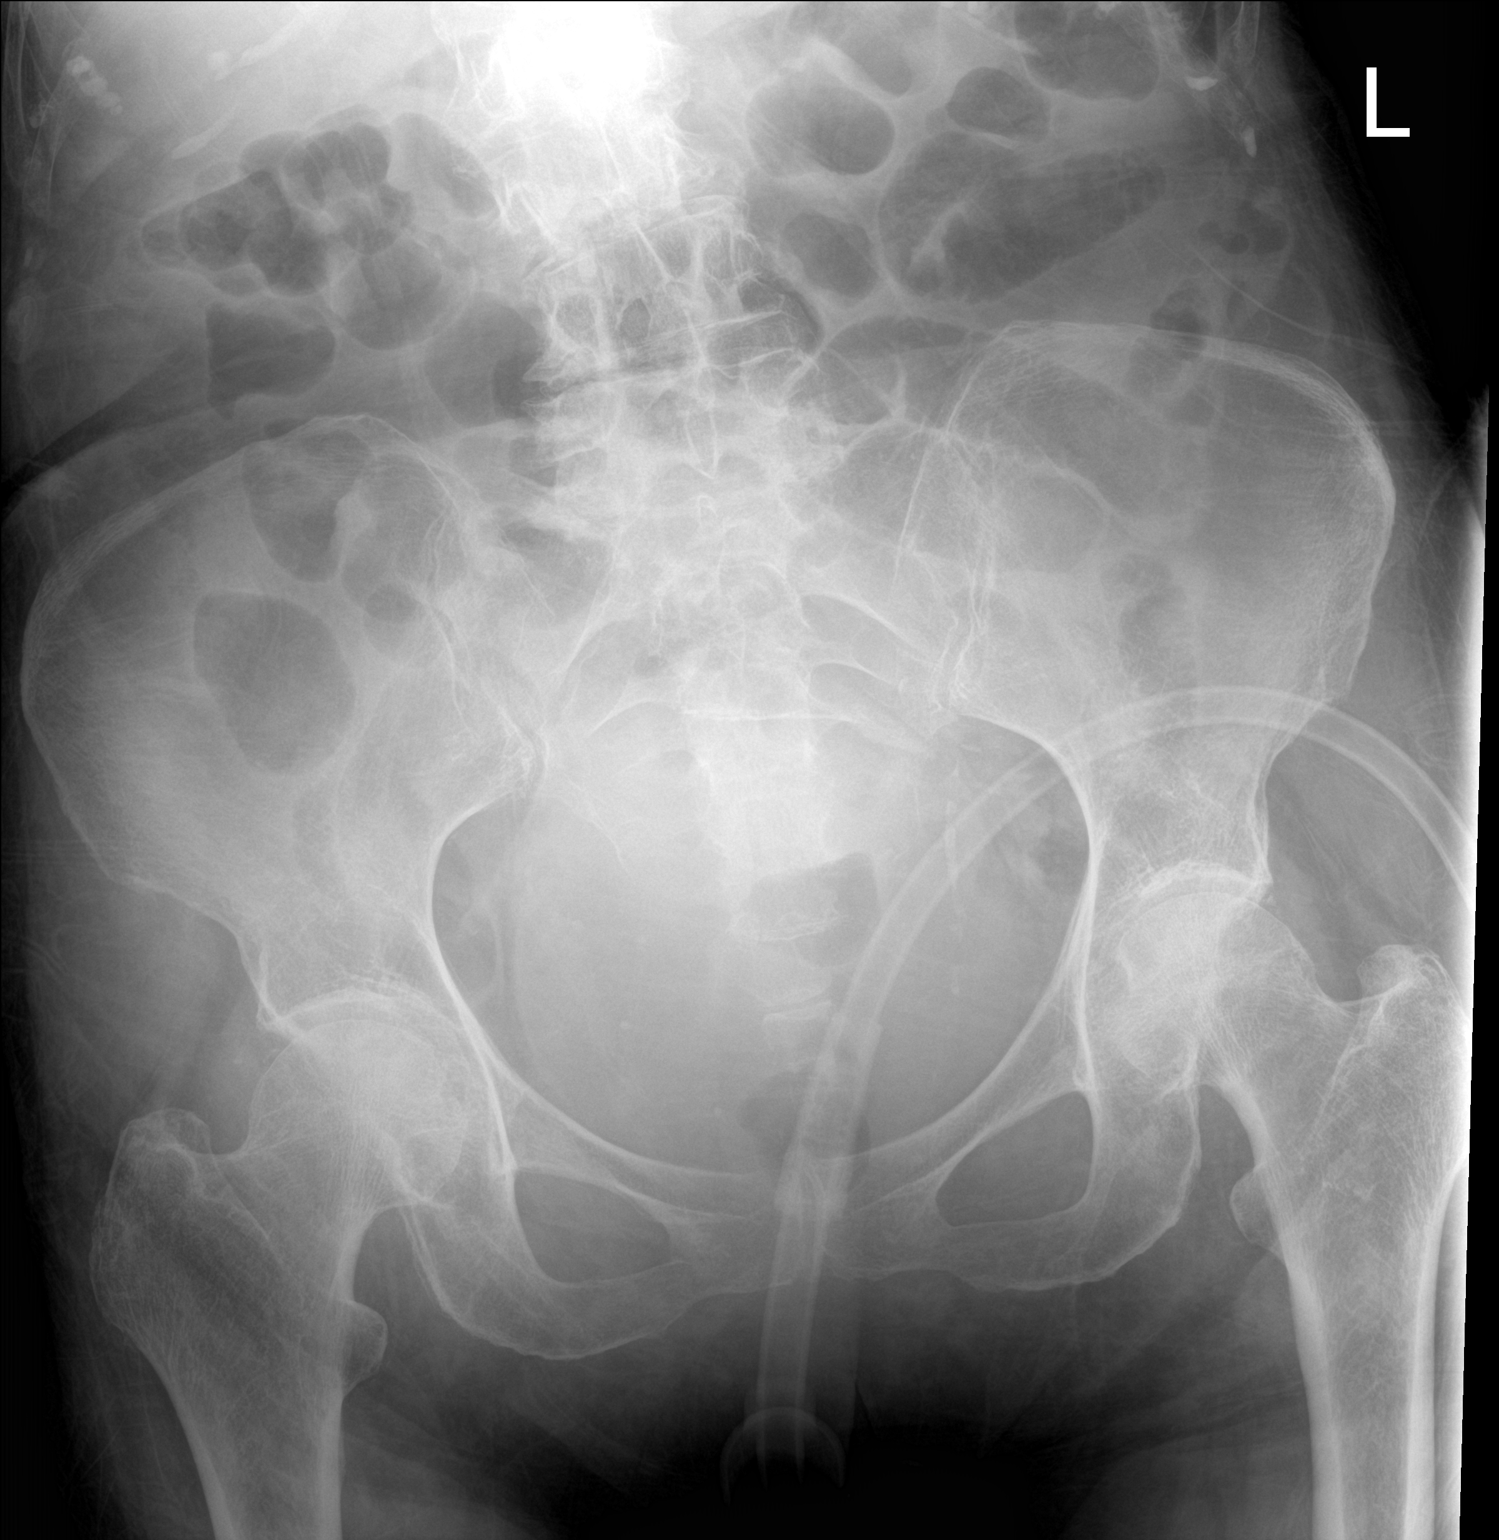

[abdomen kub (2 of 2)]
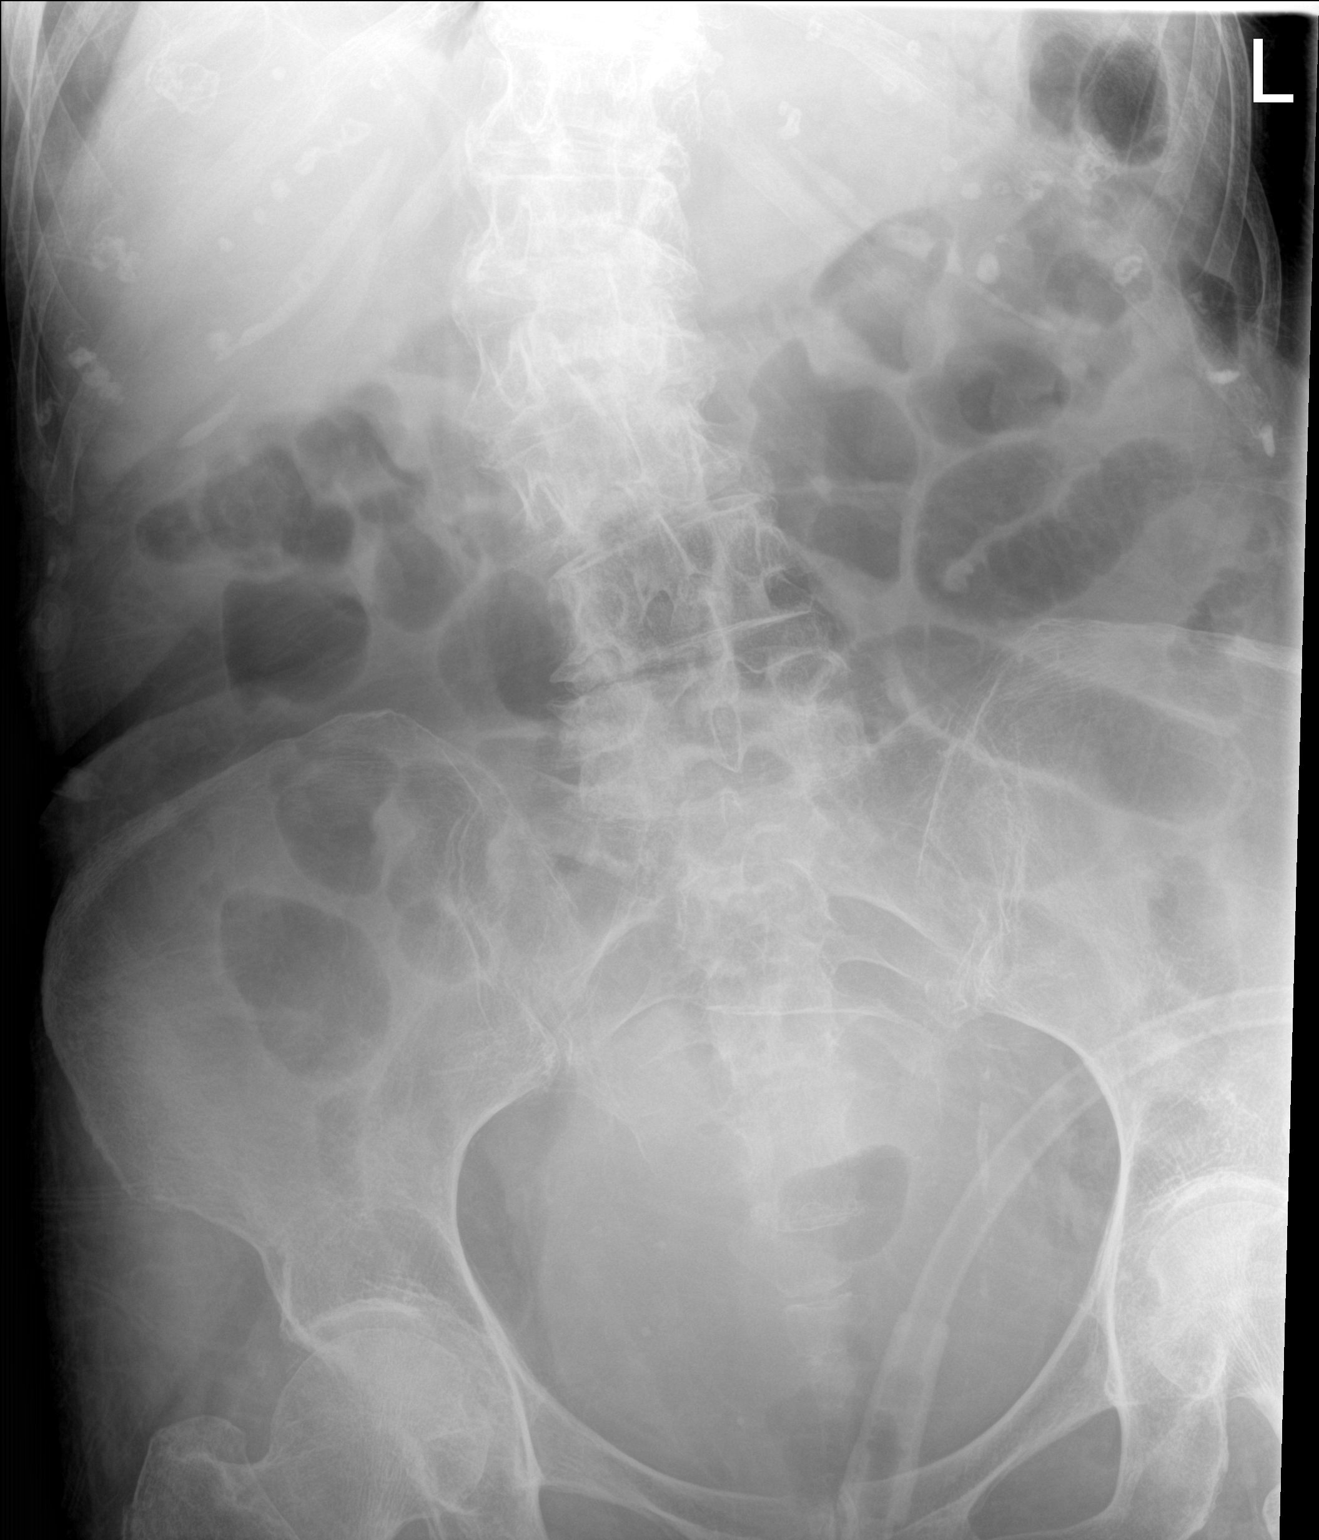

[2 of 2 positions shown; findings below may reference images not displayed]

FINDINGS: The heart and mediastinal contours are within normal limits. Aortic
calcification.

Likely eventration of the right hemidiaphragm. No focal
consolidation. No pulmonary edema. No pleural effusion. No
pneumothorax.

Nonobstructive bowel gas pattern.

No acute osseous abnormality. Multilevel degenerative changes of the
thoracolumbar spine. No retained radiopaque foreign body.
IMPRESSION: 1. No retained radiopaque foreign body.
2. No acute cardiopulmonary disease.
3. Nonobstructive bowel gas pattern.
4.  Aortic Atherosclerosis ([2U]-[2U]).

## 2020-11-28 MED ORDER — BISACODYL 5 MG PO TBEC: 5.0000 mg | DELAYED_RELEASE_TABLET | Freq: Every day | ORAL | Status: DC | PRN

## 2020-11-28 MED ORDER — SODIUM CHLORIDE 0.9 % IV SOLN
100.0000 mg | Freq: Every day | INTRAVENOUS | Status: DC
  Administered 2020-11-29 – 2020-11-30 (×2): 100 mg via INTRAVENOUS
  Filled 2020-11-28 (×2): qty 20

## 2020-11-28 MED ORDER — ASPIRIN 81 MG PO CHEW
81.0000 mg | CHEWABLE_TABLET | Freq: Every day | ORAL | Status: DC
  Administered 2020-11-28 – 2020-12-09 (×12): 81 mg via ORAL
  Filled 2020-11-28 (×12): qty 1

## 2020-11-28 MED ORDER — MEMANTINE HCL 10 MG PO TABS
10.0000 mg | ORAL_TABLET | Freq: Two times a day (BID) | ORAL | Status: DC
  Administered 2020-11-28 – 2020-12-09 (×22): 10 mg via ORAL
  Filled 2020-11-28 (×22): qty 1

## 2020-11-28 MED ORDER — ACETAMINOPHEN 325 MG PO TABS
650.0000 mg | ORAL_TABLET | Freq: Four times a day (QID) | ORAL | Status: DC | PRN
  Administered 2020-12-03 – 2020-12-09 (×3): 650 mg via ORAL
  Filled 2020-11-28 (×4): qty 2

## 2020-11-28 MED ORDER — STROKE: EARLY STAGES OF RECOVERY BOOK
Freq: Once | Status: AC
  Administered 2020-11-29: 1
  Filled 2020-11-28: qty 1

## 2020-11-28 MED ORDER — CENTRUM PO CHEW
1.0000 | CHEWABLE_TABLET | Freq: Every day | ORAL | Status: DC
  Filled 2020-11-28: qty 1

## 2020-11-28 MED ORDER — CLOPIDOGREL BISULFATE 75 MG PO TABS: 75.0000 mg | ORAL_TABLET | Freq: Every day | ORAL | Status: DC

## 2020-11-28 MED ORDER — DEXAMETHASONE SODIUM PHOSPHATE 10 MG/ML IJ SOLN
6.0000 mg | INTRAMUSCULAR | Status: DC
  Administered 2020-11-28 – 2020-11-29 (×2): 6 mg via INTRAVENOUS
  Filled 2020-11-28 (×2): qty 1

## 2020-11-28 MED ORDER — HEPARIN SODIUM (PORCINE) 5000 UNIT/ML IJ SOLN
5000.0000 [IU] | Freq: Three times a day (TID) | INTRAMUSCULAR | Status: DC
  Administered 2020-11-28 – 2020-12-09 (×30): 5000 [IU] via SUBCUTANEOUS
  Filled 2020-11-28 (×29): qty 1

## 2020-11-28 MED ORDER — SODIUM CHLORIDE 0.9 % IV SOLN
200.0000 mg | Freq: Once | INTRAVENOUS | Status: AC
  Administered 2020-11-28: 200 mg via INTRAVENOUS
  Filled 2020-11-28: qty 40

## 2020-11-28 MED ORDER — VITAMIN D 25 MCG (1000 UNIT) PO TABS
75.0000 ug | ORAL_TABLET | Freq: Every day | ORAL | Status: DC
  Administered 2020-11-28 – 2020-12-09 (×12): 75 ug via ORAL
  Filled 2020-11-28: qty 3
  Filled 2020-11-28 (×2): qty 1
  Filled 2020-11-28 (×2): qty 3
  Filled 2020-11-28 (×2): qty 1
  Filled 2020-11-28 (×2): qty 3
  Filled 2020-11-28 (×2): qty 1
  Filled 2020-11-28 (×2): qty 3

## 2020-11-28 MED ORDER — ACETAMINOPHEN 650 MG RE SUPP: 650.0000 mg | Freq: Four times a day (QID) | RECTAL | Status: DC | PRN

## 2020-11-28 MED ORDER — CEPHALEXIN 250 MG PO CAPS
500.0000 mg | ORAL_CAPSULE | Freq: Three times a day (TID) | ORAL | Status: DC
Start: 2020-11-28 — End: 2020-11-28

## 2020-11-28 MED ORDER — ONDANSETRON HCL 4 MG/2ML IJ SOLN: 4.0000 mg | Freq: Four times a day (QID) | INTRAMUSCULAR | Status: DC | PRN

## 2020-11-28 MED ORDER — ATORVASTATIN CALCIUM 40 MG PO TABS
40.0000 mg | ORAL_TABLET | Freq: Every day | ORAL | Status: DC
  Administered 2020-11-28 – 2020-12-09 (×12): 40 mg via ORAL
  Filled 2020-11-28 (×12): qty 1

## 2020-11-28 MED ORDER — ADULT MULTIVITAMIN W/MINERALS CH
1.0000 | ORAL_TABLET | Freq: Every day | ORAL | Status: DC
  Administered 2020-11-28 – 2020-12-09 (×12): 1 via ORAL
  Filled 2020-11-28 (×12): qty 1

## 2020-11-28 MED ORDER — LACTATED RINGERS IV SOLN: INTRAVENOUS | Status: AC

## 2020-11-28 MED ORDER — ONDANSETRON HCL 4 MG PO TABS: 4.0000 mg | ORAL_TABLET | Freq: Four times a day (QID) | ORAL | Status: DC | PRN

## 2020-11-28 MED ORDER — SODIUM CHLORIDE 0.9 % IV SOLN
1.0000 g | INTRAVENOUS | Status: AC
  Administered 2020-11-28 – 2020-11-30 (×3): 1 g via INTRAVENOUS
  Filled 2020-11-28 (×3): qty 10

## 2020-11-28 NOTE — ED Notes (Signed)
Neurologist at bedside assessing pt. with son

## 2020-11-28 NOTE — ED Provider Notes (Signed)
Signed out to check MRI results, if positive admit. Also that pt with covid + and possible uti.   MRI w acute/subacute cva, pt with multiple falls, ?impaired gait, confusion.  Will admit.      Cathren Laine, MD 11/28/20 [phone]

## 2020-11-28 NOTE — ED Notes (Signed)
Patient transported to MRI 

## 2020-11-28 NOTE — ED Triage Notes (Addendum)
Was called out this morning because pt has had multiple falls in the last 2 days and this morning was found unwitnessed on the floor unknown down time or how she fell EMS states the facility did not give them a baseline for mentation but states she has been more confused. EMS reports oriented to self.  Currently pt is alert and oriented to 0.  Pt is in c-collar with a right eye bruising and swelling

## 2020-11-28 NOTE — Discharge Instructions (Addendum)
Follow with Primary MD /SNF physician   Activity: As tolerated with Full fall precautions use walker/cane & assistance as needed   Disposition SNF   Diet: Dysphagia 3 with honey thick  On your next visit with your primary care physician please Get Medicines reviewed and adjusted.   Please request your Prim.MD to go over all Hospital Tests and Procedure/Radiological results at the follow up, please get all Hospital records sent to your Prim MD by signing hospital release before you go home.   If you experience worsening of your admission symptoms, develop shortness of breath, life threatening emergency, suicidal or homicidal thoughts you must seek medical attention immediately by calling 911 or calling your MD immediately  if symptoms less severe.  You Must read complete instructions/literature along with all the possible adverse reactions/side effects for all the Medicines you take and that have been prescribed to you. Take any new Medicines after you have completely understood and accpet all the possible adverse reactions/side effects.   Do not drive, operating heavy machinery, perform activities at heights, swimming or participation in water activities or provide baby sitting services if your were admitted for syncope or siezures until you have seen by Primary MD or a Neurologist and advised to do so again.  Do not drive when taking Pain medications.    Do not take more than prescribed Pain, Sleep and Anxiety Medications  Special Instructions: If you have smoked or chewed Tobacco  in the last 2 yrs please stop smoking, stop any regular Alcohol  and or any Recreational drug use.  Wear Seat belts while driving.   Please note  You were cared for by a hospitalist during your hospital stay. If you have any questions about your discharge medications or the care you received while you were in the hospital after you are discharged, you can call the unit and asked to speak with the  hospitalist on call if the hospitalist that took care of you is not available. Once you are discharged, your primary care physician will handle any further medical issues. Please note that NO REFILLS for any discharge medications will be authorized once you are discharged, as it is imperative that you return to your primary care physician (or establish a relationship with a primary care physician if you do not have one) for your aftercare needs so that they can reassess your need for medications and monitor your lab values.

## 2020-11-28 NOTE — Consult Note (Signed)
NEUROLOGY CONSULTATION NOTE   Date of service: November 28, 2020 Patient Name: Ann Johns MRN:  [id number] DOB:  1933/10/26 Reason for consult: "L PCA stroke" Requesting Provider: Leroy Sea, MD _ _ _   _ __   _ __ _ _  __ __   _ __   __ _  History of Present Illness  Ann Johns is a 87 y.o. female with PMH significant for prior stroke, prior R frontal ICH(2019), seizures(Keppra tapered off by outpatient neurologist) who is brought in by her son for evaluation for falls and confusion at the assisted living. Multiple falls over the last 2 days.  Patient ie pleasantly encephalopathic, son reports falls over the last couple of days and just confused in general. No fever. No arm or leg weakness of numbness. Has some dry cough but nothing else.  Workup in the ED with an acute moderate size L PCA stroke on MRI Brain, UA concerning for UTI and COVID 19 infection.  mRS: 4 tNK/thrombectomy: outside window LKW: unclear, possibly 11/26/20.  NIHSS components Score: Comment  1a Level of Conscious 0[x]  1[]  2[]  3[]      1b LOC Questions 0[]  1[]  2[x]       1c LOC Commands 0[x]  1[]  2[]       2 Best Gaze 0[x]  1[]  2[]       3 Visual 0[x]  1[]  2[]  3[]      4 Facial Palsy 0[x]  1[]  2[]  3[]      5a Motor Arm - left 0[x]  1[]  2[]  3[]  4[]  UN[]    5b Motor Arm - Right 0[x]  1[]  2[]  3[]  4[]  UN[]    6a Motor Leg - Left 0[x]  1[]  2[]  3[]  4[]  UN[]    6b Motor Leg - Right 0[x]  1[]  2[]  3[]  4[]  UN[]    7 Limb Ataxia 0[x]  1[]  2[]  3[]  UN[]     8 Sensory 0[x]  1[]  2[]  UN[]      9 Best Language 0[]  1[x]  2[]  3[]    Suspect this is due to encephalopathy rather than isolated aphasia  10 Dysarthria 0[x]  1[]  2[]  UN[]      11 Extinct. and Inattention 0[x]  1[]  2[]       TOTAL:        ROS   Constitutional Denies weight loss, fever and chills.   HEENT Denies changes in vision and hearing.   Respiratory Denies SOB. Has a dry cough.   CV Denies palpitations and CP   GI Denies abdominal pain, nausea, vomiting and diarrhea.    GU Denies dysuria and urinary frequency.   MSK Denies myalgia and joint pain.   Skin Denies rash and pruritus.   Neurological Denies headache and syncope.   Psychiatric Denies recent changes in mood. Denies anxiety and depression.    Past History   Past Medical History:  Diagnosis Date   Cerebrovascular disease 10/03/2020   ICH (intracerebral hemorrhage) (HCC) 11/01/2017   Right frontal   Memory change 10/03/2020   Seizures (HCC) 11/01/2017   Past Surgical History:  Procedure Laterality Date   VAGINAL HYSTERECTOMY     No family history on file. Social History   Socioeconomic History   Marital status: Widowed    Spouse name: Not on file   Number of children: Not on file   Years of education: 14   Highest education level: Not on file  Occupational History   Not on file  Tobacco Use   Smoking status: Former   Smokeless tobacco: Never  Vaping Use   Vaping Use: Never used  Substance and Sexual Activity  Alcohol use: Not Currently   Drug use: Never   Sexual activity: Not on file  Other Topics Concern   Not on file  Social History Narrative   Lives   Right handed   Caffeine use:    Social Determinants of Health   Financial Resource Strain: Not on file  Food Insecurity: Not on file  Transportation Needs: Not on file  Physical Activity: Not on file  Stress: Not on file  Social Connections: Not on file   Allergies  Allergen Reactions   Sulfa Antibiotics Rash    Medications  (Not in a hospital admission)    Vitals   Vitals:   11/28/20 1700 11/28/20 1730 11/28/20 1800 11/28/20 2000  BP: (!) 188/83 (!) 144/72 (!) 141/72 135/69  Pulse:    82  Resp: 19 (!) 22 (!) 24 17  Temp:      TempSrc:      SpO2:    95%  Weight:      Height:         Body mass index is 20.85 kg/m.  Physical Exam   General: Laying comfortably in bed; in no acute distress.  HENT: Normal oropharynx and mucosa. Normal external appearance of ears and nose.  Neck: Supple, no pain or  tenderness  CV: No JVD. No peripheral edema.  Pulmonary: Symmetric Chest rise. Normal respiratory effort.  Abdomen: Soft to touch, non-tender.  Ext: No cyanosis, edema, or deformity  Skin: No rash. Normal palpation of skin.   Musculoskeletal: Normal digits and nails by inspection. No clubbing.   Neurologic Examination  Mental status/Cognition: Alert, oriented to self and year only, poor attention, keeps forgetting conversation. Speech/language: Fluent, comprehension intact, intermittently names some objects, repetition intact.  Cranial nerves:   CN II Pupils equal and reactive to light, no VF deficits   CN III,IV,VI EOM intact, no gaze preference or deviation, no nystagmus    CN V normal sensation in V1, V2, and V3 segments bilaterally    CN VII no asymmetry, no nasolabial fold flattening    CN VIII normal hearing to speech    CN IX & X normal palatal elevation, no uvular deviation    CN XI 5/5 head turn and 5/5 shoulder shrug bilaterally    CN XII midline tongue protrusion    Motor:  Muscle bulk: normal, tone normal, pronator drift none tremor none Mvmt Root Nerve  Muscle Right Left Comments  SA C5/6 Ax Deltoid 4+ 4+   EF C5/6 Mc Biceps 4+ 4+   EE C6/7/8 Rad Triceps 4+ 4+   WF C6/7 Med FCR     WE C7/8 PIN ECU     F Ab C8/T1 U ADM/FDI 4+ 4+   HF L1/2/3 Fem Illopsoas 5 5   KE L2/3/4 Fem Quad 5 5   DF L4/5 D Peron Tib Ant 5 5   PF S1/2 Tibial Grc/Sol 5 5    Reflexes:  Right Left Comments  Pectoralis      Biceps (C5/6) 1 1   Brachioradialis (C5/6) 1 1    Triceps (C6/7) 1 1    Patellar (L3/4) 0 0    Achilles (S1)      Hoffman      Plantar     Jaw jerk    Sensation:  Light touch intact   Pin prick    Temperature    Vibration   Proprioception    Coordination/Complex Motor:  - Finger to Nose intact BL - Heel to shin intact  BL - Rapid alternating movement are slowed. - Gait: felt unsafe to assess given her multiple falls over last 48 hours  Labs   CBC:  Recent  Labs  Lab 11/28/20 1051 11/28/20 1701  WBC 10.0 8.0  NEUTROABS 7.5  --   HGB 11.9* 12.1  HCT 35.4* 38.8  MCV 91.5 99.0  PLT 250 259    Basic Metabolic Panel:  Lab Results  Component Value Date   NA 131 (L) 11/28/2020   K 3.7 11/28/2020   CO2 22 11/28/2020   GLUCOSE 116 (H) 11/28/2020   BUN 9 11/28/2020   CREATININE 0.46 11/28/2020   CALCIUM 8.8 (L) 11/28/2020   GFRNONAA >60 11/28/2020   Lipid Panel: No results found for: LDLCALC HgbA1c: No results found for: HGBA1C Urine Drug Screen: No results found for: LABOPIA, COCAINSCRNUR, LABBENZ, AMPHETMU, THCU, LABBARB  Alcohol Level No results found for: ETH  CT Head without contrast: Personally reviewed and CTH was negative for a large hypodensity concerning for a large territory infarct or hyperdensity concerning for an ICH  MR Angio head without contrast and Carotid Duplex BL: pending  MRI Brain: Personally reviewed and notable for L PCA stroke.   Impression   Ann Johns is a 87 y.o. female with PMH significant for prior stroke, prior R frontal ICH(2019), seizures(Keppra tapered off by outpatient neurologist) who is brought in by her son for evaluation for falls and confusion at the assisted living. Multiple falls over the last 2 days. Found to have a L PCA stroke, UTI and COVID Pneumonia. Her neurologic examination is notable for encephalopathy on top of her baseline dementia with some possible aphasia.  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    Secondary Diagnosis: Essential (primary) hypertension  Recommendations  Plan:  - Frequent Neuro checks per stroke unit protocol - Recommend Vascular imaging with MRA Angio Head without contrast and US Carotid doppler - Recommend obtaining TTE - Recommend obtaining Lipid panel with LDL - Please start statin if LDL > 70 - Recommend HbA1c - Antithrombotic - aspirin 81mg  daily(prior hx of spontaneous ICH) -  Recommend DVT ppx - SBP goal - permissive hypertension first 24 h < 220/110. Held home meds.  - Recommend Telemetry monitoring for arrythmia - Recommend bedside swallow screen prior to PO intake. - Stroke education booklet - Recommend PT/OT/SLP consult - Stroke team to follow.  UTI and COVID Pneumonia treatment per Hospitalist team.  ______________________________________________________________________  Plan discussed with patient's son at bedside and with Dr. with the overnight Hospitalist team over secure chat.  Thank you for the opportunity to take part in the care of this patient. If you have any further questions, please contact the neurology consultation attending.  Signed,  Loney Loh [facility] Pager Number [phone] _ _ _   _ __   _ __ _ _  __ __   _ __   __ _

## 2020-11-28 NOTE — H&P (Signed)
TRH H&P   Patient Demographics:    Ann Johns, is a 87 y.o. female  MRN: [id number]   DOB - 03-Jan-1934  Admit Date - 11/28/2020  Outpatient Primary MD for the patient is Lewis Moccasin, MD     Patient coming from: ALF, [facility]  Chief Complaint  Patient presents with   Fall   Altered Mental Status      HPI:    Ann Johns  is a 87 y.o. female, with history of intracerebral hemorrhage in 2019, seizures, early dementia, essential hypertension who lives at an assisted living facility was brought in by her son to the hospital for evaluation.  According to the son she was in today morning around 9 AM when she had a fall at assisted living facility, she subsequently did well however she was feeling weak the whole day, this morning she had another 4 at assisted living facility after which she was brought to the [facility] ER where she was found to have an acute stroke, UTI and a COVID-19 infection.  Neurology was consulted and I was requested to admit the patient to admit the patient.  Patient is pleasantly confused however she is able to answer basic questions, she denies any headache, no chest or abdominal pain, denies any focal weakness, no aches or pains, she does have a dry cough but no shortness of breath.  No blood in stool or urine, denies any dysuria.       Review of systems:    A full 10 point Review of Systems was done, except as stated above, all other Review of Systems were negative.   With Past History of the following :    Past Medical History:  Diagnosis Date   Cerebrovascular disease 10/03/2020   ICH (intracerebral hemorrhage) (HCC) 11/01/2017   Right frontal   Memory change 10/03/2020    Seizures (HCC) 11/01/2017      Past Surgical History:  Procedure Laterality Date   VAGINAL HYSTERECTOMY        Social History:     Social History   Tobacco Use   Smoking status: Former   Smokeless tobacco: Never  Substance Use Topics   Alcohol use: Not Currently        Family History :   No family history of CAD at young age  Home Medications:   Prior to Admission medications   Medication Sig Start Date End Date Taking? Authorizing Provider  acetaminophen (TYLENOL) 325 MG tablet Take 325 mg by mouth at bedtime.   Yes [provider]  memantine (NAMENDA) 10 MG tablet Take 1 tablet (10 mg total) by mouth 2 (two) times daily. 10/22/20  Yes York Spaniel, MD  Multiple Vitamins-Minerals (CENTRUM PO) Take 1 tablet by mouth daily.   Yes [provider]  Probiotic Product (PROBIOTIC PO) Take 1 capsule by mouth daily.   Yes [provider]  propranolol (INDERAL) 20 MG tablet Take 20 mg by mouth 2 (two) times daily.   Yes [provider]  sertraline (ZOLOFT) 25 MG tablet Take 25 mg by mouth daily.   Yes [provider]  VITAMIN D PO Take 75 mcg by mouth daily.   Yes [provider]     Allergies:     Allergies  Allergen Reactions   Sulfa Antibiotics Rash     Physical Exam:   Vitals  Blood pressure 136/66, pulse 80, temperature 98.1 F (36.7 C), temperature source Oral, resp. rate (!) 22, height 5\' 2"  (1.575 m), weight 51.7 kg, SpO2 95 %.   1. General -  elderly caucasian female, pleasantly confused, in no distress,  2. Normal affect and insight, Not Suicidal or Homicidal, Awake Alert x 1,   3. No F.N deficits, ALL C.Nerves Intact, Strength 5/5 all 4 extremities, Sensation intact all 4 extremities, Plantars down going.  4. Ears and Eyes appear Normal, Conjunctivae clear, PERRLA. Moist Oral Mucosa.  5. Supple Neck, No JVD, No cervical lymphadenopathy appriciated, No Carotid Bruits.  6. Symmetrical Chest wall  movement, Good air movement bilaterally, CTAB.  7. RRR, No Gallops, Rubs or Murmurs, No Parasternal Heave.  8. Positive Bowel Sounds, Abdomen Soft, No tenderness, No organomegaly appriciated,No rebound -guarding or rigidity.  9.  No Cyanosis, Normal Skin Turgor, No Skin Rash or Bruise.  10. Good muscle tone,  joints appear normal , no effusions, Normal ROM.  11. No Palpable Lymph Nodes in Neck or Axillae      Data Review:    CBC Recent Labs  Lab 11/28/20 1051  WBC 10.0  HGB 11.9*  HCT 35.4*  PLT 250  MCV 91.5  MCH 30.7  MCHC 33.6  RDW 12.4  LYMPHSABS 1.3  MONOABS 1.1*  EOSABS 0.0  BASOSABS 0.0   ------------------------------------------------------------------------------------------------------------------  Chemistries  Recent Labs  Lab 11/28/20 1051  NA 131*  K 3.7  CL 98  CO2 22  GLUCOSE 116*  BUN 9  CREATININE 0.45  CALCIUM 8.8*   ------------------------------------------------------------------------------------------------------------------ estimated creatinine clearance is 40.7 mL/min (by C-G formula based on SCr of 0.45 mg/dL). ------------------------------------------------------------------------------------------------------------------ No results for input(s): TSH, T4TOTAL, T3FREE, THYROIDAB in the last 72 hours.  Invalid input(s): FREET3  Coagulation profile No results for input(s): INR, PROTIME in the last 168 hours. ------------------------------------------------------------------------------------------------------------------- No results for input(s): DDIMER in the last 72 hours. -------------------------------------------------------------------------------------------------------------------  Cardiac Enzymes No results for input(s): CKMB, TROPONINI, MYOGLOBIN in the last 168 hours.  Invalid input(s): CK ------------------------------------------------------------------------------------------------------------------ No results  found for: BNP   ---------------------------------------------------------------------------------------------------------------  Urinalysis    Component Value Date/Time   COLORURINE YELLOW 11/28/2020 1014   APPEARANCEUR HAZY (A) 11/28/2020 1014   LABSPEC 1.011 11/28/2020 1014   PHURINE 6.0 11/28/2020 1014   GLUCOSEU NEGATIVE 11/28/2020 1014   HGBUR NEGATIVE 11/28/2020 1014   BILIRUBINUR NEGATIVE 11/28/2020 1014   KETONESUR 20 (A) 11/28/2020 1014  PROTEINUR NEGATIVE 11/28/2020 1014   NITRITE NEGATIVE 11/28/2020 1014   LEUKOCYTESUR LARGE (A) 11/28/2020 1014    ----------------------------------------------------------------------------------------------------------------   Imaging Results:    DG Chest 1 View  Result Date: 11/28/2020 CLINICAL DATA:  Stroke.  Pre MRI metal screening EXAM: CHEST  1 VIEW; PELVIS - 1-2 VIEW; ABDOMEN - 1 VIEW COMPARISON:  None. FINDINGS: The heart and mediastinal contours are within normal limits. Aortic calcification. 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 No focal consolidation. No pulmonary edema. No pleural effusion. No pneumothorax. Nonobstructive bowel gas pattern. No acute osseous abnormality. Multilevel degenerative changes of the thoracolumbar spine. No retained radiopaque foreign body. IMPRESSION: 1. No retained radiopaque foreign body. 2. No acute cardiopulmonary disease. 3. Nonobstructive bowel gas pattern. 4.  Aortic Atherosclerosis (ICD10-I70.0). Electronically Signed   By: Tish Frederickson M.D.   On: 11/28/2020 15:19   DG Pelvis 1-2 Views  Result Date: 11/28/2020 CLINICAL DATA:  Stroke.  Pre MRI metal screening EXAM: CHEST  1 VIEW; PELVIS - 1-2 VIEW; ABDOMEN - 1 VIEW COMPARISON:  None. FINDINGS: The heart and mediastinal contours are within normal limits. Aortic calcification. 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 No focal consolidation. No pulmonary edema. No pleural effusion. No pneumothorax. Nonobstructive bowel gas  pattern. No acute osseous abnormality. Multilevel degenerative changes of the thoracolumbar spine. No retained radiopaque foreign body. IMPRESSION: 1. No retained radiopaque foreign body. 2. No acute cardiopulmonary disease. 3. Nonobstructive bowel gas pattern. 4.  Aortic Atherosclerosis (ICD10-I70.0). Electronically Signed   By: Tish Frederickson M.D.   On: 11/28/2020 15:19   DG Abd 1 View  Result Date: 11/28/2020 CLINICAL DATA:  Stroke.  Pre MRI metal screening EXAM: CHEST  1 VIEW; PELVIS - 1-2 VIEW; ABDOMEN - 1 VIEW COMPARISON:  None. FINDINGS: The heart and mediastinal contours are within normal limits. Aortic calcification. 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 No focal consolidation. No pulmonary edema. No pleural effusion. No pneumothorax. Nonobstructive bowel gas pattern. No acute osseous abnormality. Multilevel degenerative changes of the thoracolumbar spine. No retained radiopaque foreign body. IMPRESSION: 1. No retained radiopaque foreign body. 2. No acute cardiopulmonary disease. 3. Nonobstructive bowel gas pattern. 4.  Aortic Atherosclerosis (ICD10-I70.0). Electronically Signed   By: Tish Frederickson M.D.   On: 11/28/2020 15:19   CT HEAD WO CONTRAST ( )  Result Date: 11/28/2020 CLINICAL DATA:  Head trauma, minor (Age >= 65y) hx of dementia with repeated falls recently, hematoma near right eye, evaluate for ICH EXAM: CT HEAD WITHOUT CONTRAST TECHNIQUE: Contiguous axial images were obtained from the base of the skull through the vertex without intravenous contrast. COMPARISON:  MRI 04/16/2020, CT 12/24/2018 FINDINGS: Brain: New area of low attenuation in the anteroinferior left frontal lobe with loss of gray-white differentiation and developing encephalomalacia is new from prior and likely represents a late subacute or chronic left ACA territory infarction. Chronic right anterior frontal lobe infarct is unchanged. No intracranial hemorrhage. No extra-axial collection. Stable size and  configuration of the ventricles. No mass lesion or mass effect. Extensive low-density changes within the periventricular and subcortical white matter compatible with chronic microvascular ischemic change. Mild diffuse cerebral volume loss. Vascular: Atherosclerotic calcifications involving the large vessels of the skull base. No unexpected hyperdense vessel. Skull: Normal. Negative for fracture or focal lesion. Sinuses/Orbits: Mucosal thickening and partial opacification of the right maxillary sinus. There is extensive mucosal thickening within the bilateral ethmoid air cells. Orbital structures within normal limits. Other: Right periorbital soft tissue swelling. IMPRESSION: 1. Low-attenuation changes in the left frontal  lobe is new from prior and likely represents a late subacute to chronic left ACA territory infarction. Further evaluation with MRI of the brain is recommended. 2. No intracranial hemorrhage identified. 3. Chronic right anterior frontal lobe infarct is unchanged. 4. Right periorbital soft tissue swelling. 5. Paranasal sinus disease as described. Electronically Signed   By: Duanne Guess D.O.   On: 11/28/2020 12:58   MR BRAIN WO CONTRAST  Result Date: 11/28/2020 CLINICAL DATA:  Stroke, follow-up; CT head with question of evolving or new subacute ACA infarct, patient with history of dementia and prior CVA, follow-up imaging. EXAM: MRI HEAD WITHOUT CONTRAST TECHNIQUE: Multiplanar, multiecho pulse sequences of the brain and surrounding structures were obtained without intravenous contrast. COMPARISON:  Prior head CT 11/28/2020.  Brain MRI 04/16/2020. FINDINGS: Brain: Mild to moderate generalized cerebral atrophy. Comparatively mild cerebellar atrophy. Moderate-sized acute/early subacute infarct within the cortical/subcortical left parietooccipital lobes and within the callosal splenium (PCA vascular territory). Redemonstrated chronic foci of encephalomalacia within the anterior frontal lobes and  anterior right temporal lobe, with associated chronic blood products. Background advanced patchy and confluent T2 FLAIR hyperintense signal abnormality within the cerebral white matter, nonspecific but compatible with chronic small vessel ischemic disease. Mild chronic small vessel ischemic changes are also present within the pons. No evidence of an intracranial mass. No extra-axial fluid collection. No midline shift. Partially empty sella turcica. Vascular: No definite loss of expected proximal arterial flow voids. Skull and upper cervical spine: No focal suspicious marrow lesion. Incompletely assessed cervical spondylosis. Sinuses/Orbits: Visualized orbits show no acute finding. Mild-to-moderate mucosal thickening within the bilateral ethmoid and right maxillary sinuses. Superimposed small volume frothy secretions within the right maxillary sinus. Mild mucosal thickening also present within the bilateral sphenoid and left maxillary sinuses. Other: 13 mm left parietal scalp lesion, nonspecific but possibly reflecting a sebaceous cyst. IMPRESSION: Moderate-sized acute/early subacute left PCA territory infarct affecting the cortical/subcortical left parietooccipital lobes and callosal splenium. Redemonstrated chronic encephalomalacia and associated chronic blood products in the anterior frontal lobes, and anterior right temporal lobe, presumably posttraumatic in etiology. Chronic small-vessel ischemic changes which are severe in the cerebral white matter, and mild in the pons. Mild-to-moderate generalized cerebral atrophy. Comparatively mild cerebellar atrophy. Paranasal sinus disease, as described. Electronically Signed   By: Jackey Loge D.O.   On: 11/28/2020 16:02    My personal review of EKG: Rhythm NSR,  no Acute ST changes   Assessment & Plan:     1. Left PCA - CVA in the Subcortical left parietooccipital lobes and callosal splenium - causing x 2 falls, starting over 36 hrs ago - she thankfully has mild  deficits, Neuro called, work up per Neuro, ASA- Statin for now, PT-OT-SLP, may need SNF.  2. UTI - Rocephin x 3, follow cultures  3.  COVID infection.  She has had 2 doses of vaccine so far, she does have a dry cough, will check inflammatory markers for now Decadron IV and remdesivir and monitor closely.  Son has consented for for treatment including Actemra if needed.  4.  Hypertension.  Allow for permissive hypertension due to #1 above.  5.  Dementia.  At risk for delirium.  Continue Namenda at home dose, minimize benzodiazepines and narcotics, son warned about possible worsening delirium while in the hospital.   DVT Prophylaxis Heparin    AM Labs Ordered, also please review Full Orders  Family Communication: Admission, patients condition and plan of care including tests being ordered have been discussed with the patient  and son who indicate understanding and agree with the plan and Code Status.  Code Status Full(short term)  Likely DC to  SNF  Condition Fair  Consults called: Neuro called by ER    Admission status: Inpt    Time spent in minutes : 35   Susa Raring M.D on 11/28/2020 at 5:37 PM  To page go to [email or web address] - password [email or web address]

## 2020-11-28 NOTE — ED Provider Notes (Signed)
[facility] EMERGENCY DEPARTMENT Provider Note   CSN: [id number] Arrival date & time: 11/28/20  1000     History Chief Complaint  Patient presents with   Fall   Altered Mental Status    Ann Johns is a 87 y.o. female w/ hx of advanced dementia, memory loss, intracranial hemorrhage, cerebrovascular disease, seizures, presenting from [facility] facility with concern for multiple falls and confusion.  Is not clear exactly when the patient may have fallen, but they reported she had multiple falls over the past 2 days.  She had another unwitnessed event this morning and was found down.  EMS reports that it is not clear from the facility with the patient's baseline is, but per my review of her outpatient medical records and neurology notes, does appear that she has had a rapid cognitive decline the past several months.  Patient herself is oriented only to herself, cannot provide any additional history.  She denies any headache, neck pain, chest pain.  She was last evaluated by her neurologist Dr Ann Johns on 10/03/20, noted per his office record, to have had an MRI of the brain showing extensive white matter disease, noted also the family was expressing concerns the patient has not been functioning well independently, requiring increasing levels of supervision, not taking her medications on her own, continuing to walk with a walker.  She remains on Keppra for seizure prophylaxis and low-dose Namenda.  The neurologist office note makes mention of progressive dementia and cerebrovascular disease, decision to taper the patient off of Keppra over 3 weeks.    HPI     Past Medical History:  Diagnosis Date   Cerebrovascular disease 10/03/2020   ICH (intracerebral hemorrhage) (HCC) 11/01/2017   Right frontal   Memory change 10/03/2020   Seizures (HCC) 11/01/2017    Patient Active Problem List   Diagnosis Date Noted   Memory change 10/03/2020   Cerebrovascular  disease 10/03/2020   Seizures (HCC) 11/01/2017   ICH (intracerebral hemorrhage) (HCC) 11/01/2017    Past Surgical History:  Procedure Laterality Date   VAGINAL HYSTERECTOMY       OB History   No obstetric history on file.     No family history on file.  Social History   Tobacco Use   Smoking status: Former   Smokeless tobacco: Never  Building services engineer Use: Never used  Substance Use Topics   Alcohol use: Not Currently   Drug use: Never    Home Medications Prior to Admission medications   Medication Sig Start Date End Date Taking? Authorizing Provider  acetaminophen (TYLENOL) 325 MG tablet Take 325 mg by mouth at bedtime.   Yes [provider]  memantine (NAMENDA) 10 MG tablet Take 1 tablet (10 mg total) by mouth 2 (two) times daily. 10/22/20  Yes York Spaniel, MD  Multiple Vitamins-Minerals (CENTRUM PO) Take 1 tablet by mouth daily.   Yes [provider]  Probiotic Product (PROBIOTIC PO) Take 1 capsule by mouth daily.   Yes [provider]  propranolol (INDERAL) 20 MG tablet Take 20 mg by mouth 2 (two) times daily.   Yes [provider]  sertraline (ZOLOFT) 25 MG tablet Take 25 mg by mouth daily.   Yes [provider]  VITAMIN D PO Take 75 mcg by mouth daily.   Yes [provider]    Allergies    Sulfa antibiotics  Review of Systems   Review of Systems  Unable to perform  ROS: Dementia (level 5 caveat)   Physical Exam Updated Vital Signs BP 136/66   Pulse 80   Temp 98.1 F (36.7 C) (Oral)   Resp (!) 22   Ht 5\' 2"  (1.575 m)   Wt 51.7 kg   SpO2 95%   BMI 20.85 kg/m   Physical Exam Constitutional:      General: She is not in acute distress. HENT:     Head: Normocephalic. No raccoon eyes, Battle's sign, abrasion, masses or laceration.     Comments: Contusion right lateral forehead Eyes:     Conjunctiva/sclera: Conjunctivae normal.     Pupils: Pupils are equal, round, and reactive to light.   Cardiovascular:     Rate and Rhythm: Normal rate and regular rhythm.  Pulmonary:     Effort: Pulmonary effort is normal. No respiratory distress.  Abdominal:     General: There is no distension.     Tenderness: There is no abdominal tenderness.  Skin:    General: Skin is warm and dry.  Neurological:     General: No focal deficit present.     Mental Status: She is alert.     Comments: AAO x 1 (person only)    ED Results / Procedures / Treatments   Labs (all labs ordered are listed, but only abnormal results are displayed) Labs Reviewed  RESP PANEL BY RT-PCR (FLU A&B, COVID) ARPGX2 - Abnormal; Notable for the following components:      Result Value   SARS Coronavirus 2 by RT PCR POSITIVE (*)    All other components within normal limits  BASIC METABOLIC PANEL - Abnormal; Notable for the following components:   Sodium 131 (*)    Glucose, Bld 116 (*)    Calcium 8.8 (*)    All other components within normal limits  CBC WITH DIFFERENTIAL/PLATELET - Abnormal; Notable for the following components:   Hemoglobin 11.9 (*)    HCT 35.4 (*)    Monocytes Absolute 1.1 (*)    All other components within normal limits  URINALYSIS, ROUTINE W REFLEX MICROSCOPIC - Abnormal; Notable for the following components:   APPearance HAZY (*)    Ketones, ur 20 (*)    Leukocytes,Ua LARGE (*)    All other components within normal limits  URINE CULTURE  TROPONIN I (HIGH SENSITIVITY)    EKG EKG Interpretation  Date/Time:  Thursday November 28 2020 10:11:03 EDT Ventricular Rate:  85 PR Interval:  181 QRS Duration: 80 QT Interval:  364 QTC Calculation: 433 R Axis:   -21 Text Interpretation: Sinus rhythm Probable left atrial enlargement Confirmed by 04-16-2004 [phone]) on 11/28/2020 1:00:08 PM  Radiology DG Chest 1 View  Result Date: 11/28/2020 CLINICAL DATA:  Stroke.  Pre MRI metal screening EXAM: CHEST  1 VIEW; PELVIS - 1-2 VIEW; ABDOMEN - 1 VIEW COMPARISON:  None. FINDINGS: The heart and  mediastinal contours are within normal limits. Aortic calcification. 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 No focal consolidation. No pulmonary edema. No pleural effusion. No pneumothorax. Nonobstructive bowel gas pattern. No acute osseous abnormality. Multilevel degenerative changes of the thoracolumbar spine. No retained radiopaque foreign body. IMPRESSION: 1. No retained radiopaque foreign body. 2. No acute cardiopulmonary disease. 3. Nonobstructive bowel gas pattern. 4.  Aortic Atherosclerosis (ICD10-I70.0). Electronically Signed   By: 11/30/2020 M.D.   On: 11/28/2020 15:19   DG Pelvis 1-2 Views  Result Date: 11/28/2020 CLINICAL DATA:  Stroke.  Pre MRI metal screening EXAM: CHEST  1 VIEW; PELVIS - 1-2  VIEW; ABDOMEN - 1 VIEW COMPARISON:  None. FINDINGS: The heart and mediastinal contours are within normal limits. Aortic calcification. 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 No focal consolidation. No pulmonary edema. No pleural effusion. No pneumothorax. Nonobstructive bowel gas pattern. No acute osseous abnormality. Multilevel degenerative changes of the thoracolumbar spine. No retained radiopaque foreign body. IMPRESSION: 1. No retained radiopaque foreign body. 2. No acute cardiopulmonary disease. 3. Nonobstructive bowel gas pattern. 4.  Aortic Atherosclerosis (ICD10-I70.0). Electronically Signed   By: Tish Frederickson M.D.   On: 11/28/2020 15:19   DG Abd 1 View  Result Date: 11/28/2020 CLINICAL DATA:  Stroke.  Pre MRI metal screening EXAM: CHEST  1 VIEW; PELVIS - 1-2 VIEW; ABDOMEN - 1 VIEW COMPARISON:  None. FINDINGS: The heart and mediastinal contours are within normal limits. Aortic calcification. 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 No focal consolidation. No pulmonary edema. No pleural effusion. No pneumothorax. Nonobstructive bowel gas pattern. No acute osseous abnormality. Multilevel degenerative changes of the thoracolumbar spine. No retained radiopaque foreign  body. IMPRESSION: 1. No retained radiopaque foreign body. 2. No acute cardiopulmonary disease. 3. Nonobstructive bowel gas pattern. 4.  Aortic Atherosclerosis (ICD10-I70.0). Electronically Signed   By: Tish Frederickson M.D.   On: 11/28/2020 15:19   CT HEAD WO CONTRAST ( )  Result Date: 11/28/2020 CLINICAL DATA:  Head trauma, minor (Age >= 65y) hx of dementia with repeated falls recently, hematoma near right eye, evaluate for ICH EXAM: CT HEAD WITHOUT CONTRAST TECHNIQUE: Contiguous axial images were obtained from the base of the skull through the vertex without intravenous contrast. COMPARISON:  MRI 04/16/2020, CT 12/24/2018 FINDINGS: Brain: New area of low attenuation in the anteroinferior left frontal lobe with loss of gray-white differentiation and developing encephalomalacia is new from prior and likely represents a late subacute or chronic left ACA territory infarction. Chronic right anterior frontal lobe infarct is unchanged. No intracranial hemorrhage. No extra-axial collection. Stable size and configuration of the ventricles. No mass lesion or mass effect. Extensive low-density changes within the periventricular and subcortical white matter compatible with chronic microvascular ischemic change. Mild diffuse cerebral volume loss. Vascular: Atherosclerotic calcifications involving the large vessels of the skull base. No unexpected hyperdense vessel. Skull: Normal. Negative for fracture or focal lesion. Sinuses/Orbits: Mucosal thickening and partial opacification of the right maxillary sinus. There is extensive mucosal thickening within the bilateral ethmoid air cells. Orbital structures within normal limits. Other: Right periorbital soft tissue swelling. IMPRESSION: 1. Low-attenuation changes in the left frontal lobe is new from prior and likely represents a late subacute to chronic left ACA territory infarction. Further evaluation with MRI of the brain is recommended. 2. No intracranial hemorrhage  identified. 3. Chronic right anterior frontal lobe infarct is unchanged. 4. Right periorbital soft tissue swelling. 5. Paranasal sinus disease as described. Electronically Signed   By: Duanne Guess D.O.   On: 11/28/2020 12:58    Procedures Procedures   Medications Ordered in ED Medications  cephALEXin (KEFLEX) capsule 500 mg (has no administration in time range)    ED Course  I have reviewed the triage vital signs and the nursing notes.  Pertinent labs & imaging results that were available during my care of the patient were reviewed by me and considered in my medical decision making (see chart for details).  Initial diagnosis includes worsening progressive microvascular disease and dementia versus infection including UTI versus electrolyte imbalance versus atypical ACS versus CVA vs other.  I personally reviewed the patient's prior medical records including her outpatient  neurology office notes.  I reviewed interpret her EKG which shows a normal sinus rhythm without acute ischemic findings.  I interpreted and reviewed her labs showing that she is COVID-positive, UA with leukocytes and no nitrites.  CBC and BMP largely unremarkable.  Troponin is 5.  CT scan of the head reviewed showing possible subacute left ACA territory infarct versus chronic infarct.  MRI ordered for better imaging.  This point have a lower suspicion for sepsis, pneumonia.  I suspect her symptoms may be related to COVID fatigue.  She is pleasantly demented but otherwise well-appearing on exam, no hypoxia or vital sign instability.  I do think she be reasonably safe for discharge home with her family, report that she has 24-hour caretakers and that they are able to watch her.  They are aware that she is COVID-positive.  EKG was reviewed and per my interpretation does not show any acute ischemic findings  I do feel it would be reasonable to treat for possible UTI with these leukocytes in her urine.  Clinical Course as  of 11/28/20 1602  Thu Nov 28, 2020  1213 SARS Coronavirus 2 by RT PCR(!): POSITIVE [MT]  1223 Patient son present in the ED and updated at bedside regarding her COVID status.  He reports she has been around them for several days, she had a mild cough for a few days, is not clear when her symptoms may started.  She has however had the COVID vaccines with the boosters.  I explained this may be causing some of her fatigue.  We are still waiting on a UA and a CT scan of her head, but if these are unremarkable anticipate she can be discharged [MT]  1312 CT findings questioning a new subacute infarct, we will obtain an MRI of the brain.  Patient and her son are updated and agreeable with this plan. [MT]  1336 Hazy urine with leuks, will start on keflex TID x 5 days for UTI [MT]  1436 MRI team is not able to clear patient for MRI due to her dementia and inability to reach family.  Therefore per their protocol they have ordered xray imaging to clear the patient.   [MT]    Clinical Course User Index [MT] Zanai Mallari, Kermit Balo, MD    Final Clinical Impression(s) / ED Diagnoses Final diagnoses:  Acute cystitis without hematuria  COVID-19    Rx / DC Orders ED Discharge Orders     None        Terald Sleeper, MD 11/28/20 [phone]

## 2020-11-29 ENCOUNTER — Inpatient Hospital Stay (HOSPITAL_COMMUNITY): Payer: Medicare Other

## 2020-11-29 ENCOUNTER — Encounter (HOSPITAL_COMMUNITY): Payer: Self-pay | Admitting: Internal Medicine

## 2020-11-29 DIAGNOSIS — I6389 Other cerebral infarction: Secondary | ICD-10-CM | POA: Diagnosis not present

## 2020-11-29 DIAGNOSIS — I639 Cerebral infarction, unspecified: Secondary | ICD-10-CM | POA: Diagnosis not present

## 2020-11-29 DIAGNOSIS — N3000 Acute cystitis without hematuria: Secondary | ICD-10-CM | POA: Diagnosis not present

## 2020-11-29 DIAGNOSIS — U071 COVID-19: Secondary | ICD-10-CM | POA: Diagnosis not present

## 2020-11-29 DIAGNOSIS — R410 Disorientation, unspecified: Secondary | ICD-10-CM | POA: Diagnosis not present

## 2020-11-29 LAB — CBC WITH DIFFERENTIAL/PLATELET
Abs Immature Granulocytes: 0.06 10*3/uL (ref 0.00–0.07)
Basophils Absolute: 0 10*3/uL (ref 0.0–0.1)
Basophils Relative: 0 %
Eosinophils Absolute: 0 10*3/uL (ref 0.0–0.5)
Eosinophils Relative: 0 %
HCT: 39.1 % (ref 36.0–46.0)
Hemoglobin: 12.7 g/dL (ref 12.0–15.0)
Immature Granulocytes: 1 %
Lymphocytes Relative: 13 %
Lymphs Abs: 1 10*3/uL (ref 0.7–4.0)
MCH: 30.3 pg (ref 26.0–34.0)
MCHC: 32.5 g/dL (ref 30.0–36.0)
MCV: 93.3 fL (ref 80.0–100.0)
Monocytes Absolute: 0.2 10*3/uL (ref 0.1–1.0)
Monocytes Relative: 3 %
Neutro Abs: 6.2 10*3/uL (ref 1.7–7.7)
Neutrophils Relative %: 83 %
Platelets: 269 10*3/uL (ref 150–400)
RBC: 4.19 MIL/uL (ref 3.87–5.11)
RDW: 12.4 % (ref 11.5–15.5)
WBC: 7.4 10*3/uL (ref 4.0–10.5)
nRBC: 0 % (ref 0.0–0.2)

## 2020-11-29 LAB — COMPREHENSIVE METABOLIC PANEL
ALT: 15 U/L (ref 0–44)
AST: 25 U/L (ref 15–41)
Albumin: 3.6 g/dL (ref 3.5–5.0)
Alkaline Phosphatase: 75 U/L (ref 38–126)
Anion gap: 15 (ref 5–15)
BUN: 7 mg/dL — ABNORMAL LOW (ref 8–23)
CO2: 17 mmol/L — ABNORMAL LOW (ref 22–32)
Calcium: 9 mg/dL (ref 8.9–10.3)
Chloride: 103 mmol/L (ref 98–111)
Creatinine, Ser: 0.62 mg/dL (ref 0.44–1.00)
GFR, Estimated: >60 mL/min (ref >60)
Glucose, Bld: 184 mg/dL — ABNORMAL HIGH (ref 70–99)
Potassium: 3.9 mmol/L (ref 3.5–5.1)
Sodium: 135 mmol/L (ref 135–145)
Total Bilirubin: 1.3 mg/dL — ABNORMAL HIGH (ref 0.3–1.2)
Total Protein: 6.7 g/dL (ref 6.5–8.1)

## 2020-11-29 LAB — ECHOCARDIOGRAM COMPLETE
Area-P 1/2: 5.46 cm2
Height: 62 in
S' Lateral: 2.9 cm
Weight: 1823.65 oz

## 2020-11-29 LAB — LIPID PANEL
Cholesterol: 196 mg/dL (ref 0–200)
HDL: 48 mg/dL (ref >40)
LDL Cholesterol: 133 mg/dL — ABNORMAL HIGH (ref 0–99)
Total CHOL/HDL Ratio: 4.1 RATIO
Triglycerides: 75 mg/dL (ref <150)
VLDL: 15 mg/dL (ref 0–40)

## 2020-11-29 LAB — HEMOGLOBIN A1C
Hgb A1c MFr Bld: 5.7 % — ABNORMAL HIGH (ref 4.8–5.6)
Mean Plasma Glucose: 116.89 mg/dL

## 2020-11-29 LAB — MAGNESIUM: Magnesium: 1.8 mg/dL (ref 1.7–2.4)

## 2020-11-29 LAB — BRAIN NATRIURETIC PEPTIDE: B Natriuretic Peptide: 60.9 pg/mL (ref 0.0–100.0)

## 2020-11-29 LAB — D-DIMER, QUANTITATIVE: D-Dimer, Quant: 1.15 ug/mL-FEU — ABNORMAL HIGH (ref 0.00–0.50)

## 2020-11-29 LAB — C-REACTIVE PROTEIN: CRP: 13.5 mg/dL — ABNORMAL HIGH (ref <1.0)

## 2020-11-29 IMAGING — DX DG CHEST 1V PORT
1 series · 1 of 1 positions shown · non-contrast
Comparison: [DATE]

CLINICAL DATA: Shortness of breath.  Altered mental status.

EXAM:
PORTABLE CHEST 1 VIEW

[chest]
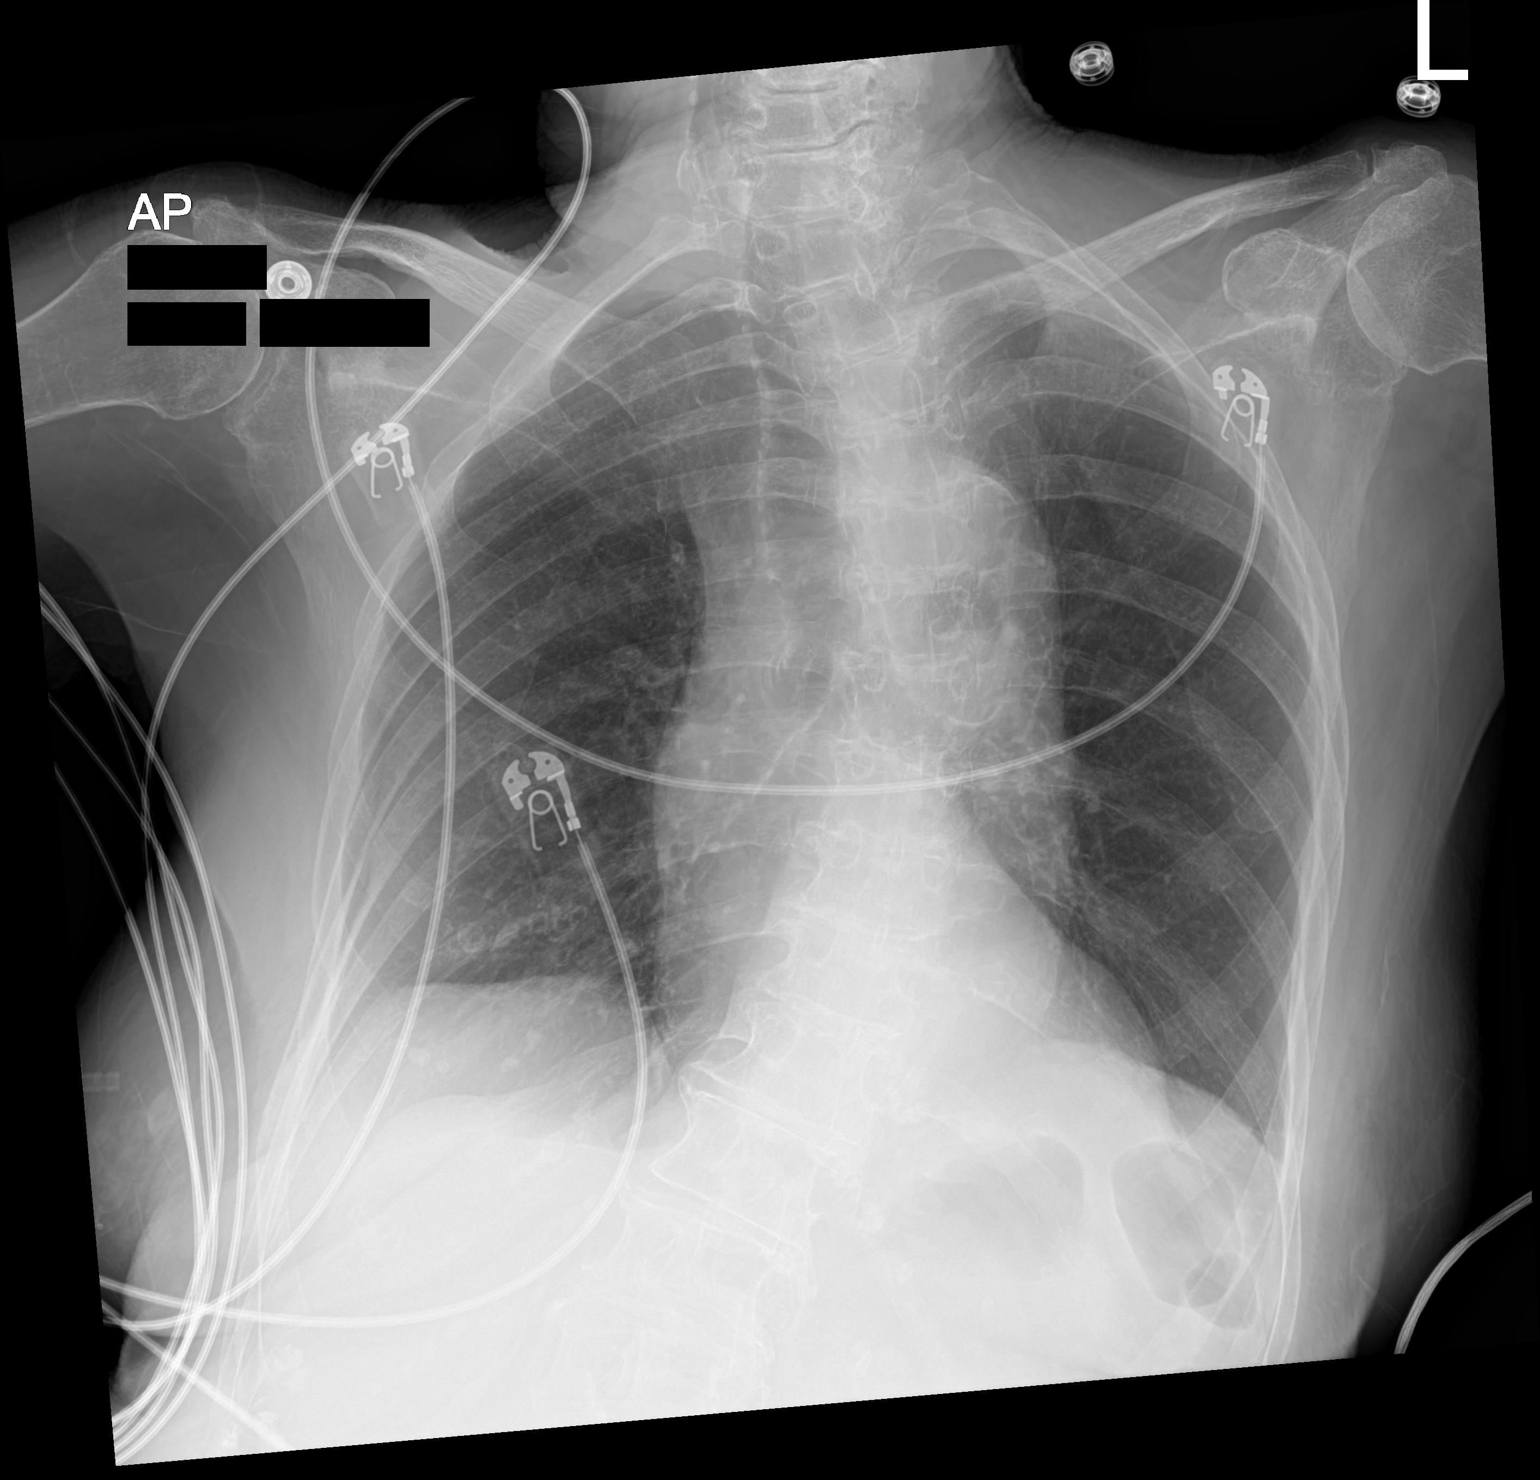

[1 of 1 positions shown; findings below may reference images not displayed]

FINDINGS: The patient is rotated to the right with grossly unchanged
cardiomediastinal silhouette. Eventration of the right hemidiaphragm
is again noted. No confluent airspace opacity, edema, sizable
pleural effusion, or pneumothorax is identified. Thoracic
levoscoliosis is noted.
IMPRESSION: No active disease.

## 2020-11-29 MED ORDER — PERFLUTREN LIPID MICROSPHERE
1.0000 mL | INTRAVENOUS | Status: AC | PRN
  Administered 2020-11-29: 2 mL via INTRAVENOUS
  Filled 2020-11-29: qty 10

## 2020-11-29 MED ORDER — HALOPERIDOL LACTATE 5 MG/ML IJ SOLN
1.0000 mg | Freq: Three times a day (TID) | INTRAMUSCULAR | Status: DC | PRN
  Administered 2020-11-29: 1 mg via INTRAVENOUS
  Filled 2020-11-29 (×2): qty 1

## 2020-11-29 MED ORDER — LEVETIRACETAM 250 MG PO TABS
250.0000 mg | ORAL_TABLET | Freq: Two times a day (BID) | ORAL | Status: DC
  Filled 2020-11-29 (×2): qty 1

## 2020-11-29 MED ORDER — HALOPERIDOL LACTATE 5 MG/ML IJ SOLN
1.0000 mg | Freq: Once | INTRAMUSCULAR | Status: AC | PRN
  Administered 2020-11-29: 1 mg via INTRAVENOUS
  Filled 2020-11-29: qty 1

## 2020-11-29 MED ORDER — CLOPIDOGREL BISULFATE 75 MG PO TABS
75.0000 mg | ORAL_TABLET | Freq: Every day | ORAL | Status: DC
  Administered 2020-11-29 – 2020-12-09 (×11): 75 mg via ORAL
  Filled 2020-11-29 (×11): qty 1

## 2020-11-29 NOTE — Progress Notes (Signed)
PROGRESS NOTE                                                                                                                                                                                                             Patient Demographics:    Ann Johns, is a 87 y.o. female, DOB - August 08, 1933, ZOX:[id number]  Outpatient Primary MD for the patient is Lewis Moccasin, MD    LOS - 1  Admit date - 11/28/2020    Chief Complaint  Patient presents with   Fall   Altered Mental Status       Brief Narrative (HPI from H&P)   Ann Johns  is a 87 y.o. female, with history of intracerebral hemorrhage in 2019, seizures, early dementia, essential hypertension who lives at an assisted living facility was brought in by her son to the hospital for evaluation.  According to the son she was in today morning around 9 AM when she had a fall at assisted living facility, she subsequently did well however she was feeling weak the whole day, this morning she had another 4 at assisted living facility after which she was brought to the [facility] ER where she was found to have an acute stroke, UTI and a COVID-19 infection.  Neurology was consulted and I was requested to admit the patient to admit the patient   Subjective:    Ann Johns today has, No headache, No chest pain, No abdominal pain - No Nausea, No new weakness tingling or numbness, no SOB.   Assessment  & Plan :     1. Left PCA - CVA in the Subcortical left parietooccipital lobes and callosal splenium - causing x 2 falls, starting over 36 hrs before admission - she thankfully has mild deficits, Neuro following, ASA- Statin for now, PT-OT-SLP, may need SNF.   2. UTI - Rocephin x 3, follow cultures   3.  COVID infection.  She has had 2 doses of vaccine so far, she does have a dry cough, will check inflammatory markers for now Decadron IV and remdesivir and monitor closely.  Son has  consented for for treatment including Actemra if needed.   4.  Hypertension.  Allow for permissive hypertension due to #1 above.   5.  Dementia.  At risk for delirium.  Continue Namenda at home dose, minimize benzodiazepines  and narcotics, son warned about possible worsening delirium while in the hospital.   SpO2: 97 %  Recent Labs  Lab 11/28/20 1014 11/28/20 1051 11/28/20 1701 11/29/20 0219  WBC  --  10.0 8.0 7.4  HGB  --  11.9* 12.1 12.7  HCT  --  35.4* 38.8 39.1  PLT  --  250 259 269  CRP  --   --  11.5* 13.5*  BNP  --   --   --  60.9  DDIMER  --   --  1.45* 1.15*  AST  --   --   --  25  ALT  --   --   --  15  ALKPHOS  --   --   --  75  BILITOT  --   --   --  1.3*  ALBUMIN  --   --   --  3.6  SARSCOV2NAA POSITIVE*  --   --   --              Condition - Extremely Guarded  Family Communication  :  son at the time of admission, [phone] message left 11/29/20 at 11.44 pm  Code Status :  Full  Consults  :  Neuro  PUD Prophylaxis :    Procedures  :     MRI - Moderate-sized acute/early subacute left PCA territory infarct affecting the cortical/subcortical left parietooccipital lobes and callosal splenium. Redemonstrated chronic encephalomalacia and associated chronic blood products in the anterior frontal lobes, and anterior right temporal lobe, presumably posttraumatic in etiology. Chronic small-vessel ischemic changes which are severe in the cerebral white matter, and mild in the pons. Mild-to-moderate generalized cerebral atrophy. Comparatively mild cerebellar atrophy. Paranasal sinus disease  MRA - 1. No emergent large vessel occlusion or high-grade stenosis. 2. Mild narrowing of the right PCA P2 segment.     TTE -       Disposition Plan  :    Status is: Inpatient - Remains inpatient appropriate because: CVA , Covid, placement  DVT Prophylaxis  :    heparin injection 5,000 Units Start: 11/28/20 1715    Lab Results  Component Value Date   PLT 269  11/29/2020    Diet :  Diet Order             DIET SOFT Room service appropriate? Yes; Fluid consistency: Nectar Thick  Diet effective now                    Inpatient Medications  Scheduled Meds:  aspirin  81 mg Oral Daily   atorvastatin  40 mg Oral Daily   cholecalciferol  75 mcg Oral Daily   dexamethasone (DECADRON) injection  6 mg Intravenous Q24H   heparin  5,000 Units Subcutaneous Q8H   memantine  10 mg Oral BID   multivitamin with minerals  1 tablet Oral Daily   Continuous Infusions:  cefTRIAXone (ROCEPHIN)  IV Stopped (11/28/20 1909)   lactated ringers 75 mL/hr at 11/29/20 0750   remdesivir 100 mg in NS 100 mL     PRN Meds:.acetaminophen **OR** acetaminophen, bisacodyl, ondansetron **OR** ondansetron (ZOFRAN) IV  Antibiotics  :    Anti-infectives (From admission, onward)    Start     Dose/Rate Route Frequency Ordered Stop   11/29/20 1000  remdesivir 100 mg in sodium chloride 0.9 % 100 mL IVPB       See Hyperspace for full Linked Orders Report.   100 mg 200 mL/hr over 30 Minutes  Intravenous Daily 11/28/20 1743 12/03/20 0959   11/28/20 1900  remdesivir 200 mg in sodium chloride 0.9% 250 mL IVPB       See Hyperspace for full Linked Orders Report.   200 mg 580 mL/hr over 30 Minutes Intravenous Once 11/28/20 1743 11/28/20 2232   11/28/20 1715  cefTRIAXone (ROCEPHIN) 1 g in sodium chloride 0.9 % 100 mL IVPB        1 g 200 mL/hr over 30 Minutes Intravenous Every 24 hours 11/28/20 1700 12/01/20 1714   11/28/20 1330  cephALEXin (KEFLEX) capsule 500 mg  Status:  Discontinued        500 mg Oral Every 8 hours 11/28/20 1328 11/28/20 1700        Time Spent in minutes  30   Susa Raring M.D on 11/29/2020 at 10:47 AM  To page go to [email or web address]   [facility] -  Office  [phone]  See all Orders from today for further details    Objective:   Vitals:   11/29/20 0300 11/29/20 0522 11/29/20 0748 11/29/20 0752  BP: (!) 151/82 (!) 157/81 (!)  153/82 128/89  Pulse: 96 (!) 104 (!) 111 (!) 102  Resp: 17 (!) 24 (!) 31 20  Temp:      TempSrc:      SpO2: 95% 96% 97% 97%  Weight:      Height:        Wt Readings from Last 3 Encounters:  11/28/20 51.7 kg  10/03/20 51.7 kg  04/02/20 54 kg     Intake/Output Summary (Last 24 hours) at 11/29/2020 1047 Last data filed at 11/28/2020 2232 Gross per 24 hour  Intake 250 ml  Output --  Net 250 ml     Physical Exam  Awake Alert, No new F.N deficits, Normal affect [place].AT,PERRAL Supple Neck, No JVD,   Symmetrical Chest wall movement, Good air movement bilaterally, CTAB RRR,No Gallops,Rubs or new Murmurs,  +ve B.Sounds, Abd Soft, No tenderness,   No Cyanosis, Clubbing or edema,      RN pressure injury documentation:     Data Review:    CBC Recent Labs  Lab 11/28/20 1051 11/28/20 1701 11/29/20 0219  WBC 10.0 8.0 7.4  HGB 11.9* 12.1 12.7  HCT 35.4* 38.8 39.1  PLT 250 259 269  MCV 91.5 99.0 93.3  MCH 30.7 30.9 30.3  MCHC 33.6 31.2 32.5  RDW 12.4 12.3 12.4  LYMPHSABS 1.3  --  1.0  MONOABS 1.1*  --  0.2  EOSABS 0.0  --  0.0  BASOSABS 0.0  --  0.0    Recent Labs  Lab 11/28/20 1051 11/28/20 1701 11/29/20 0219  NA 131*  --  135  K 3.7  --  3.9  CL 98  --  103  CO2 22  --  17*  GLUCOSE 116*  --  184*  BUN 9  --  7*  CREATININE 0.45 0.46 0.62  CALCIUM 8.8*  --  9.0  AST  --   --  25  ALT  --   --  15  ALKPHOS  --   --  75  BILITOT  --   --  1.3*  ALBUMIN  --   --  3.6  MG  --   --  1.8  CRP  --  11.5* 13.5*  DDIMER  --  1.45* 1.15*  HGBA1C  --   --  5.7*  BNP  --   --  60.9    ------------------------------------------------------------------------------------------------------------------ Recent Labs  11/29/20 0219  CHOL 196  HDL 48  LDLCALC 133*  TRIG 75  CHOLHDL 4.1    Lab Results  Component Value Date   HGBA1C 5.7 (H) 11/29/2020    ------------------------------------------------------------------------------------------------------------------ No results for input(s): TSH, T4TOTAL, T3FREE, THYROIDAB in the last 72 hours.  Invalid input(s): FREET3  Cardiac Enzymes No results for input(s): CKMB, TROPONINI, MYOGLOBIN in the last 168 hours.  Invalid input(s): CK ------------------------------------------------------------------------------------------------------------------    Component Value Date/Time   BNP 60.9 11/29/2020 0219     Radiology Reports DG Chest 1 View  Result Date: 11/28/2020 CLINICAL DATA:  Stroke.  Pre MRI metal screening EXAM: CHEST  1 VIEW; PELVIS - 1-2 VIEW; ABDOMEN - 1 VIEW COMPARISON:  None. FINDINGS: The heart and mediastinal contours are within normal limits. Aortic calcification. 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 No focal consolidation. No pulmonary edema. No pleural effusion. No pneumothorax. Nonobstructive bowel gas pattern. No acute osseous abnormality. Multilevel degenerative changes of the thoracolumbar spine. No retained radiopaque foreign body. IMPRESSION: 1. No retained radiopaque foreign body. 2. No acute cardiopulmonary disease. 3. Nonobstructive bowel gas pattern. 4.  Aortic Atherosclerosis (ICD10-I70.0). Electronically Signed   By: Tish Frederickson M.D.   On: 11/28/2020 15:19   DG Pelvis 1-2 Views  Result Date: 11/28/2020 CLINICAL DATA:  Stroke.  Pre MRI metal screening EXAM: CHEST  1 VIEW; PELVIS - 1-2 VIEW; ABDOMEN - 1 VIEW COMPARISON:  None. FINDINGS: The heart and mediastinal contours are within normal limits. Aortic calcification. 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 No focal consolidation. No pulmonary edema. No pleural effusion. No pneumothorax. Nonobstructive bowel gas pattern. No acute osseous abnormality. Multilevel degenerative changes of the thoracolumbar spine. No retained radiopaque foreign body. IMPRESSION: 1. No retained radiopaque foreign  body. 2. No acute cardiopulmonary disease. 3. Nonobstructive bowel gas pattern. 4.  Aortic Atherosclerosis (ICD10-I70.0). Electronically Signed   By: Tish Frederickson M.D.   On: 11/28/2020 15:19   DG Abd 1 View  Result Date: 11/28/2020 CLINICAL DATA:  Stroke.  Pre MRI metal screening EXAM: CHEST  1 VIEW; PELVIS - 1-2 VIEW; ABDOMEN - 1 VIEW COMPARISON:  None. FINDINGS: The heart and mediastinal contours are within normal limits. Aortic calcification. 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 No focal consolidation. No pulmonary edema. No pleural effusion. No pneumothorax. Nonobstructive bowel gas pattern. No acute osseous abnormality. Multilevel degenerative changes of the thoracolumbar spine. No retained radiopaque foreign body. IMPRESSION: 1. No retained radiopaque foreign body. 2. No acute cardiopulmonary disease. 3. Nonobstructive bowel gas pattern. 4.  Aortic Atherosclerosis (ICD10-I70.0). Electronically Signed   By: Tish Frederickson M.D.   On: 11/28/2020 15:19   CT HEAD WO CONTRAST ( )  Result Date: 11/28/2020 CLINICAL DATA:  Head trauma, minor (Age >= 65y) hx of dementia with repeated falls recently, hematoma near right eye, evaluate for ICH EXAM: CT HEAD WITHOUT CONTRAST TECHNIQUE: Contiguous axial images were obtained from the base of the skull through the vertex without intravenous contrast. COMPARISON:  MRI 04/16/2020, CT 12/24/2018 FINDINGS: Brain: New area of low attenuation in the anteroinferior left frontal lobe with loss of gray-white differentiation and developing encephalomalacia is new from prior and likely represents a late subacute or chronic left ACA territory infarction. Chronic right anterior frontal lobe infarct is unchanged. No intracranial hemorrhage. No extra-axial collection. Stable size and configuration of the ventricles. No mass lesion or mass effect. Extensive low-density changes within the periventricular and subcortical white matter compatible with chronic  microvascular ischemic change. Mild diffuse cerebral volume loss. Vascular:  Atherosclerotic calcifications involving the large vessels of the skull base. No unexpected hyperdense vessel. Skull: Normal. Negative for fracture or focal lesion. Sinuses/Orbits: Mucosal thickening and partial opacification of the right maxillary sinus. There is extensive mucosal thickening within the bilateral ethmoid air cells. Orbital structures within normal limits. Other: Right periorbital soft tissue swelling. IMPRESSION: 1. Low-attenuation changes in the left frontal lobe is new from prior and likely represents a late subacute to chronic left ACA territory infarction. Further evaluation with MRI of the brain is recommended. 2. No intracranial hemorrhage identified. 3. Chronic right anterior frontal lobe infarct is unchanged. 4. Right periorbital soft tissue swelling. 5. Paranasal sinus disease as described. Electronically Signed   By: Duanne Guess D.O.   On: 11/28/2020 12:58   MR ANGIO HEAD WO CONTRAST  Result Date: 11/28/2020 CLINICAL DATA:  Acute neurologic deficit EXAM: MRA HEAD WITHOUT CONTRAST TECHNIQUE: Angiographic images of the Circle of Willis were acquired using MRA technique without intravenous contrast. COMPARISON:  No pertinent prior exam. FINDINGS: POSTERIOR CIRCULATION: --Vertebral arteries: Normal --Inferior cerebellar arteries: Normal. --Basilar artery: Normal. --Superior cerebellar arteries: Normal. --Posterior cerebral arteries: Mild narrowing of the right PCA P2 segment. Normal left. ANTERIOR CIRCULATION: --Intracranial internal carotid arteries: Normal. --Anterior cerebral arteries (ACA): Normal. --Middle cerebral arteries (MCA): Normal. ANATOMIC VARIANTS: None IMPRESSION: 1. No emergent large vessel occlusion or high-grade stenosis. 2. Mild narrowing of the right PCA P2 segment. Electronically Signed   By: Deatra Robinson M.D.   On: 11/28/2020 23:20   MR BRAIN WO CONTRAST  Result Date:  11/28/2020 CLINICAL DATA:  Stroke, follow-up; CT head with question of evolving or new subacute ACA infarct, patient with history of dementia and prior CVA, follow-up imaging. EXAM: MRI HEAD WITHOUT CONTRAST TECHNIQUE: Multiplanar, multiecho pulse sequences of the brain and surrounding structures were obtained without intravenous contrast. COMPARISON:  Prior head CT 11/28/2020.  Brain MRI 04/16/2020. FINDINGS: Brain: Mild to moderate generalized cerebral atrophy. Comparatively mild cerebellar atrophy. Moderate-sized acute/early subacute infarct within the cortical/subcortical left parietooccipital lobes and within the callosal splenium (PCA vascular territory). Redemonstrated chronic foci of encephalomalacia within the anterior frontal lobes and anterior right temporal lobe, with associated chronic blood products. Background advanced patchy and confluent T2 FLAIR hyperintense signal abnormality within the cerebral white matter, nonspecific but compatible with chronic small vessel ischemic disease. Mild chronic small vessel ischemic changes are also present within the pons. No evidence of an intracranial mass. No extra-axial fluid collection. No midline shift. Partially empty sella turcica. Vascular: No definite loss of expected proximal arterial flow voids. Skull and upper cervical spine: No focal suspicious marrow lesion. Incompletely assessed cervical spondylosis. Sinuses/Orbits: Visualized orbits show no acute finding. Mild-to-moderate mucosal thickening within the bilateral ethmoid and right maxillary sinuses. Superimposed small volume frothy secretions within the right maxillary sinus. Mild mucosal thickening also present within the bilateral sphenoid and left maxillary sinuses. Other: 13 mm left parietal scalp lesion, nonspecific but possibly reflecting a sebaceous cyst. IMPRESSION: Moderate-sized acute/early subacute left PCA territory infarct affecting the cortical/subcortical left parietooccipital lobes  and callosal splenium. Redemonstrated chronic encephalomalacia and associated chronic blood products in the anterior frontal lobes, and anterior right temporal lobe, presumably posttraumatic in etiology. Chronic small-vessel ischemic changes which are severe in the cerebral white matter, and mild in the pons. Mild-to-moderate generalized cerebral atrophy. Comparatively mild cerebellar atrophy. Paranasal sinus disease, as described. Electronically Signed   By: Jackey Loge D.O.   On: 11/28/2020 16:02   DG Chest Port 1 View  Result Date:  11/29/2020 CLINICAL DATA:  Shortness of breath.  Altered mental status. EXAM: PORTABLE CHEST 1 VIEW COMPARISON:  11/28/2020 FINDINGS: The patient is rotated to the right with grossly unchanged cardiomediastinal silhouette. Eventration of the right hemidiaphragm is again noted. No confluent airspace opacity, edema, sizable pleural effusion, or pneumothorax is identified. Thoracic levoscoliosis is noted. IMPRESSION: No active disease. Electronically Signed   By: Sebastian Ache M.D.   On: 11/29/2020 05:31

## 2020-11-29 NOTE — Progress Notes (Signed)
Echocardiogram not completed, patient undergoing other testing. Will re-attempt at another time.    Ann Johns

## 2020-11-29 NOTE — ED Notes (Addendum)
PO meds given with apple sauce. IVF paused for remdesivir infusion. PT finished at [facility]. Son present. Pt alert, NAD, calm, interactive, resps e/u, speaking with son. Tolerated and worked well with PT and PO meds.

## 2020-11-29 NOTE — Progress Notes (Signed)
PT eval complete/documentation pending. Will most definitely require SNF and 24/7A at DC.   Madelaine Etienne, DPT, PN2   Supplemental Physical Therapist [facility]    Pager [phone] Acute Rehab Office [phone]

## 2020-11-29 NOTE — ED Notes (Signed)
EEG into room, at [facility].

## 2020-11-29 NOTE — Progress Notes (Signed)
STROKE TEAM PROGRESS NOTE   INTERVAL HISTORY No acute events  Son at bedside. He provides history.  Lives in ALF with medication assistant to prepare meds. Found  on the floor 10/12 am at the ALF without apparent injury. She had been coughing on the night prior to admission. Then was found to have fallen again this morning. She was brought to [facility] ED where UTI, COVID + and stroke was found.  Has tremors at baseline per son. He also reports it is  not unusual for her to be disoriented to day/date and time. Had seizure in [place] a couple of years go. Followed by neurologist, Dr. Anne Hahn.  Today patient is lying quietly in bed with eyes closed. She does not appear to attend to the examiners or engage in conversations in the room. She does not consistently follow commands. Decreased blink on the right. We discussed her history, recent events/symptoms and ongoing stroke work up and plan of care. Son's questions were answered.   Vitals:   11/29/20 0300 11/29/20 0522 11/29/20 0748 11/29/20 0752  BP: (!) 151/82 (!) 157/81 (!) 153/82 128/89  Pulse: 96 (!) 104 (!) 111 (!) 102  Resp: 17 (!) 24 (!) 31 20  Temp:      TempSrc:      SpO2: 95% 96% 97% 97%  Weight:      Height:       CBC:  Recent Labs  Lab 11/28/20 1051 11/28/20 1701 11/29/20 0219  WBC 10.0 8.0 7.4  NEUTROABS 7.5  --  6.2  HGB 11.9* 12.1 12.7  HCT 35.4* 38.8 39.1  MCV 91.5 99.0 93.3  PLT 250 259 269   Basic Metabolic Panel:  Recent Labs  Lab 11/28/20 1051 11/28/20 1701 11/29/20 0219  NA 131*  --  135  K 3.7  --  3.9  CL 98  --  103  CO2 22  --  17*  GLUCOSE 116*  --  184*  BUN 9  --  7*  CREATININE 0.45 0.46 0.62  CALCIUM 8.8*  --  9.0  MG  --   --  1.8   Lipid Panel:  Recent Labs  Lab 11/29/20 0219  CHOL 196  TRIG 75  HDL 48  CHOLHDL 4.1  VLDL 15  LDLCALC 671*   HgbA1c:  Recent Labs  Lab 11/29/20 0219  HGBA1C 5.7*   Urine Drug Screen: No results for input(s): LABOPIA, COCAINSCRNUR, LABBENZ,  AMPHETMU, THCU, LABBARB in the last 168 hours.  Alcohol Level No results for input(s): ETH in the last 168 hours.  IMAGING past 24 hours DG Chest 1 View  Result Date: 11/28/2020 CLINICAL DATA:  Stroke.  Pre MRI metal screening EXAM: CHEST  1 VIEW; PELVIS - 1-2 VIEW; ABDOMEN - 1 VIEW COMPARISON:  None. FINDINGS: The heart and mediastinal contours are within normal limits. Aortic calcification. 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 No focal consolidation. No pulmonary edema. No pleural effusion. No pneumothorax. Nonobstructive bowel gas pattern. No acute osseous abnormality. Multilevel degenerative changes of the thoracolumbar spine. No retained radiopaque foreign body. IMPRESSION: 1. No retained radiopaque foreign body. 2. No acute cardiopulmonary disease. 3. Nonobstructive bowel gas pattern. 4.  Aortic Atherosclerosis (ICD10-I70.0). Electronically Signed   By: Tish Frederickson M.D.   On: 11/28/2020 15:19   DG Pelvis 1-2 Views  Result Date: 11/28/2020 CLINICAL DATA:  Stroke.  Pre MRI metal screening EXAM: CHEST  1 VIEW; PELVIS - 1-2 VIEW; ABDOMEN - 1 VIEW COMPARISON:  None. FINDINGS: The heart  and mediastinal contours are within normal limits. Aortic calcification. 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 No focal consolidation. No pulmonary edema. No pleural effusion. No pneumothorax. Nonobstructive bowel gas pattern. No acute osseous abnormality. Multilevel degenerative changes of the thoracolumbar spine. No retained radiopaque foreign body. IMPRESSION: 1. No retained radiopaque foreign body. 2. No acute cardiopulmonary disease. 3. Nonobstructive bowel gas pattern. 4.  Aortic Atherosclerosis (ICD10-I70.0). Electronically Signed   By: Tish Frederickson M.D.   On: 11/28/2020 15:19   DG Abd 1 View  Result Date: 11/28/2020 CLINICAL DATA:  Stroke.  Pre MRI metal screening EXAM: CHEST  1 VIEW; PELVIS - 1-2 VIEW; ABDOMEN - 1 VIEW COMPARISON:  None. FINDINGS: The heart and mediastinal contours  are within normal limits. Aortic calcification. 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 No focal consolidation. No pulmonary edema. No pleural effusion. No pneumothorax. Nonobstructive bowel gas pattern. No acute osseous abnormality. Multilevel degenerative changes of the thoracolumbar spine. No retained radiopaque foreign body. IMPRESSION: 1. No retained radiopaque foreign body. 2. No acute cardiopulmonary disease. 3. Nonobstructive bowel gas pattern. 4.  Aortic Atherosclerosis (ICD10-I70.0). Electronically Signed   By: Tish Frederickson M.D.   On: 11/28/2020 15:19   CT HEAD WO CONTRAST ( )  Result Date: 11/28/2020 CLINICAL DATA:  Head trauma, minor (Age >= 65y) hx of dementia with repeated falls recently, hematoma near right eye, evaluate for ICH EXAM: CT HEAD WITHOUT CONTRAST TECHNIQUE: Contiguous axial images were obtained from the base of the skull through the vertex without intravenous contrast. COMPARISON:  MRI 04/16/2020, CT 12/24/2018 FINDINGS: Brain: New area of low attenuation in the anteroinferior left frontal lobe with loss of gray-white differentiation and developing encephalomalacia is new from prior and likely represents a late subacute or chronic left ACA territory infarction. Chronic right anterior frontal lobe infarct is unchanged. No intracranial hemorrhage. No extra-axial collection. Stable size and configuration of the ventricles. No mass lesion or mass effect. Extensive low-density changes within the periventricular and subcortical white matter compatible with chronic microvascular ischemic change. Mild diffuse cerebral volume loss. Vascular: Atherosclerotic calcifications involving the large vessels of the skull base. No unexpected hyperdense vessel. Skull: Normal. Negative for fracture or focal lesion. Sinuses/Orbits: Mucosal thickening and partial opacification of the right maxillary sinus. There is extensive mucosal thickening within the bilateral ethmoid air cells. Orbital  structures within normal limits. Other: Right periorbital soft tissue swelling. IMPRESSION: 1. Low-attenuation changes in the left frontal lobe is new from prior and likely represents a late subacute to chronic left ACA territory infarction. Further evaluation with MRI of the brain is recommended. 2. No intracranial hemorrhage identified. 3. Chronic right anterior frontal lobe infarct is unchanged. 4. Right periorbital soft tissue swelling. 5. Paranasal sinus disease as described. Electronically Signed   By: Duanne Guess D.O.   On: 11/28/2020 12:58   MR ANGIO HEAD WO CONTRAST  Result Date: 11/28/2020 CLINICAL DATA:  Acute neurologic deficit EXAM: MRA HEAD WITHOUT CONTRAST TECHNIQUE: Angiographic images of the Circle of Willis were acquired using MRA technique without intravenous contrast. COMPARISON:  No pertinent prior exam. FINDINGS: POSTERIOR CIRCULATION: --Vertebral arteries: Normal --Inferior cerebellar arteries: Normal. --Basilar artery: Normal. --Superior cerebellar arteries: Normal. --Posterior cerebral arteries: Mild narrowing of the right PCA P2 segment. Normal left. ANTERIOR CIRCULATION: --Intracranial internal carotid arteries: Normal. --Anterior cerebral arteries (ACA): Normal. --Middle cerebral arteries (MCA): Normal. ANATOMIC VARIANTS: None IMPRESSION: 1. No emergent large vessel occlusion or high-grade stenosis. 2. Mild narrowing of the right PCA P2 segment. Electronically Signed   By:  Deatra Robinson M.D.   On: 11/28/2020 23:20   MR BRAIN WO CONTRAST  Result Date: 11/28/2020 CLINICAL DATA:  Stroke, follow-up; CT head with question of evolving or new subacute ACA infarct, patient with history of dementia and prior CVA, follow-up imaging. EXAM: MRI HEAD WITHOUT CONTRAST TECHNIQUE: Multiplanar, multiecho pulse sequences of the brain and surrounding structures were obtained without intravenous contrast. COMPARISON:  Prior head CT 11/28/2020.  Brain MRI 04/16/2020. FINDINGS: Brain: Mild to  moderate generalized cerebral atrophy. Comparatively mild cerebellar atrophy. Moderate-sized acute/early subacute infarct within the cortical/subcortical left parietooccipital lobes and within the callosal splenium (PCA vascular territory). Redemonstrated chronic foci of encephalomalacia within the anterior frontal lobes and anterior right temporal lobe, with associated chronic blood products. Background advanced patchy and confluent T2 FLAIR hyperintense signal abnormality within the cerebral white matter, nonspecific but compatible with chronic small vessel ischemic disease. Mild chronic small vessel ischemic changes are also present within the pons. No evidence of an intracranial mass. No extra-axial fluid collection. No midline shift. Partially empty sella turcica. Vascular: No definite loss of expected proximal arterial flow voids. Skull and upper cervical spine: No focal suspicious marrow lesion. Incompletely assessed cervical spondylosis. Sinuses/Orbits: Visualized orbits show no acute finding. Mild-to-moderate mucosal thickening within the bilateral ethmoid and right maxillary sinuses. Superimposed small volume frothy secretions within the right maxillary sinus. Mild mucosal thickening also present within the bilateral sphenoid and left maxillary sinuses. Other: 13 mm left parietal scalp lesion, nonspecific but possibly reflecting a sebaceous cyst. IMPRESSION: Moderate-sized acute/early subacute left PCA territory infarct affecting the cortical/subcortical left parietooccipital lobes and callosal splenium. Redemonstrated chronic encephalomalacia and associated chronic blood products in the anterior frontal lobes, and anterior right temporal lobe, presumably posttraumatic in etiology. Chronic small-vessel ischemic changes which are severe in the cerebral white matter, and mild in the pons. Mild-to-moderate generalized cerebral atrophy. Comparatively mild cerebellar atrophy. Paranasal sinus disease, as  described. Electronically Signed   By: Jackey Loge D.O.   On: 11/28/2020 16:02   DG Chest Port 1 View  Result Date: 11/29/2020 CLINICAL DATA:  Shortness of breath.  Altered mental status. EXAM: PORTABLE CHEST 1 VIEW COMPARISON:  11/28/2020 FINDINGS: The patient is rotated to the right with grossly unchanged cardiomediastinal silhouette. Eventration of the right hemidiaphragm is again noted. No confluent airspace opacity, edema, sizable pleural effusion, or pneumothorax is identified. Thoracic levoscoliosis is noted. IMPRESSION: No active disease. Electronically Signed   By: Sebastian Ache M.D.   On: 11/29/2020 05:31    PHYSICAL EXAM Pleasant elderly frail Caucasian lady not in distress. . Afebrile. Head is nontraumatic. Neck is supple without bruit.    Cardiac exam no murmur or gallop. Lungs are clear to auscultation. Distal pulses are well felt.  Neurological Exam : Patient is sleepy but can be easily aroused.  She is awake alert oriented to time and place.  Diminished attention, registration and recall.  Speech is clear without dysarthria or aphasia.  Extraocular movements are full range without nystagmus.  Decreased blink to threat on the right compared to the left.  Face is symmetric without weakness.  Tongue midline.  Motor system exam she is able to move all 4 extremities well against gravity but is not very cooperative for detailed testing.  Reflexes are symmetric.  Sensation intact.  Plantars downgoing.  Gait not tested. ASSESSMENT/PLAN  Ann Johns  is a 87 y.o. female, with history of intracerebral hemorrhage in 2019, seizures assoc with ICH, early dementia, essential hypertension who lives at an  assisted living facility was brought in by her son to the hospital for evaluation.  According to the son she  lives in ALF with medication tech to prepare meds. Found on the floor 10/12 am at the ALF without apparent injury. She had been coughing on the night prior to admission. Then was found to have  fallen again this morning. She was brought to [facility] ED where UTI, COVID + and stroke was found. Neurology was consulted.    Stroke: Moderate-sized acute/early subacute left PCA territory infarct affecting the cortical/subcortical left parietooccipital lobes and callosal splenium etiology likely thromboembolic in the setting of COVID infection and intracranial atherosclerosis  Code Stroke CT head: 1. Low-attenuation changes in the left frontal lobe is new from prior and likely represents a late subacute to chronic left ACA territory infarction. Further evaluation with MRI of the brain is recommended. 2. No intracranial hemorrhage identified. 3. Chronic right anterior frontal lobe infarct is unchanged. 4. Right periorbital soft tissue swelling. 5. Paranasal sinus disease as described.  MRI   Moderate-sized acute/early subacute left PCA territory infarct affecting the cortical/subcortical left parietooccipital lobes and callosal splenium. Redemonstrated chronic encephalomalacia and associated chronic blood products in the anterior frontal lobes, and anterior right temporal lobe, presumably posttraumatic in etiology. Chronic small-vessel ischemic changes which are severe in the cerebral white matter, and mild in the pons. Mild-to-moderate generalized cerebral atrophy. Comparatively mild cerebellar atrophy. Paranasal sinus disease, as described.   MRA   No emergent large vessel occlusion or high-grade stenosis. Mild narrowing of the right PCA P2 segment. Carotid Doppler  PENDING 2D Echo PENDING EEG This study is suggestive of mild diffuse encephalopathy, nonspecific etiology. No seizures or epileptiform discharges were seen throughout the recording LDL 133 HgbA1c 5.7 VTE prophylaxis - SCDs, recommended     Diet   Diet NPO time specified  Not on anticoagulant or antiplatelet prior to admission is setting of history of ICH 2019.  Recommend recommend aspirin and Plavix for 3 weeks  followed by aspirin alone Therapy recommendations:  PENDING Disposition:  TBD  BP Management Home meds:  None, no hx of HTN Stable Permissive hypertension (OK if < 220/120) but gradually normalize in 5-7 days Long-term BP goal normotensive  Hyperlipidemia Home meds:  None  LDL 133, goal < 70  High intensity statin: Lipitor 40mg   Continue statin at discharge       Seizure  Seizure was presenting symptom during ICH episode AUG 2019 She was tapered off Keppra in Aug 2022 by Dr. Sep 2022 EEG: no epileptiform activity intermittent slow generalized.          Dementia On Namenda, taken off Aricept for unclear reasons per chart review (Dr. Anne Hahn office note). Continue home Namenda    Other Stroke Risk Factors Advanced Age >/= 52  Former Cigarette smoker Hx of ICH August 2019   Other Active Problems   Hospital day # 1  This patient was seen and evaluated with Dr. September 2019. He directed the plan of care.  Pearlean Brownie, NP-C   STROKE MD : I have personally obtained history,examined this patient, reviewed notes, independently viewed imaging studies, participated in medical decision making and plan of care.ROS completed by me personally and pertinent positives fully documented  I have made any additions or clarifications directly to the above note. Agree with note above.  Patient presented with increasing falls and confusion without focal deficits but MRI shows left PCA embolic infarct and CT angiogram shows intracranial atherosclerosis as well as symmetric infarct  2.  She has a history of dementia baseline as well as remote history of seizures.  She is also been found to be incidentally COVID-positive.  Recommend aspirin and Plavix for 3 weeks followed by aspirin alone and aggressive risk factor modification.  Patient advised not to drive.  Continue ongoing stroke work-up.  Management of COVID infection as per primary team.  Long discussion with patient and son at the bedside and  answered questions.  Discussed with Dr. Thedore Mins.  Greater than 50% time during this 35-minute visit was spent in counseling and coordination of care about stroke and discussion of stroke prevention and treatment and answering questions  Delia Heady, MD Medical Director [facility] Pager: [phone] 11/29/2020 4:40 PM   To contact Stroke Continuity provider, please refer to [email or web address]. After hours, contact General Neurology

## 2020-11-29 NOTE — ED Notes (Signed)
EEG finished at [facility]. Son stepped out of room. PT into room, at [facility].

## 2020-11-29 NOTE — ED Notes (Signed)
Alert, NAD, calm, interactive, sitting upright, confused.

## 2020-11-29 NOTE — ED Notes (Signed)
Son at BS 

## 2020-11-29 NOTE — ED Notes (Addendum)
Son left BS, bed alarm going off, trying to get out of bed, not following commands. MD secure messaged.

## 2020-11-29 NOTE — ED Notes (Signed)
Admitting MD at [facility]. Pt alert, NAD, calm, interactive, resps e/u, speaking clearly, following commands. Baseline mentation confused.

## 2020-11-29 NOTE — Evaluation (Signed)
Physical Therapy Evaluation Patient Details Name: Ann Johns MRN: [id number] DOB: [birth year] Today's Date: 11/29/2020  History of Present Illness  87yo female who presented on 10/13, brought to hospital by her son from her ALF after weakness and multiple falls. 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 PMH ICH right frontal lobe 2019, seizures, dementia, HTN  Clinical Impression   Patient received in ED stretcher, quite confused and only oriented to herself. Son present and provided PLOF and history. Able to get to EOB with MinA, but unable to follow commands adequately enough to really perform an accurate neuro assessment. Very distractible and really not able to follow cues well today. Unable to maintain sitting balance for MMT and had mild multidirectional balance loss/unsteadiness. Left on stretcher with chair alarm placed under her to act as "bed alarm", all needs otherwise met, RN present and attending. Will really need rehab in SNF setting as well as 24/7A- family agreeable.        Recommendations for follow up therapy are one component of a multi-disciplinary discharge planning process, led by the attending physician.  Recommendations may be updated based on patient status, additional functional criteria and insurance authorization.  Follow Up Recommendations SNF;Supervision/Assistance - 24 hour    Equipment Recommendations  Rolling walker with 5" wheels;3in1 (PT);Wheelchair (measurements PT);Wheelchair cushion (measurements PT)    Recommendations for Other Services       Precautions / Restrictions Precautions Precautions: Fall;Other (comment) Precaution Comments: acute PCA territory CVA, hx R frontal ICH Restrictions Weight Bearing Restrictions: No      Mobility  Bed Mobility Overal bed mobility: Needs Assistance Bed Mobility: Supine to Sit;Sit to Supine     Supine to sit: Min assist Sit to supine: Min assist   General bed mobility comments:  MinA for safety/balance and line management    Transfers                 General transfer comment: deferred  Ambulation/Gait             General Gait Details: deferred  Stairs            Wheelchair Mobility    Modified Rankin (Stroke Patients Only)       Balance Overall balance assessment: Needs assistance Sitting-balance support: Feet unsupported;No upper extremity supported Sitting balance-Leahy Scale: Poor Sitting balance - Comments: unable to maintain balance during UE MMT in sitting- multidirectional balance loss       Standing balance comment: deferred                             Pertinent Vitals/Pain Pain Assessment: Faces Faces Pain Scale: No hurt Pain Intervention(s): Limited activity within patient's tolerance;Monitored during session    [place] expects to be discharged to::  (independent living facility- [facility])                 Additional Comments: has been getting more assist from nursing recently- help with getting to dining hall and managing meds, help getting to exercise classes. Usually uses a rollator. Had a recent fall on Wednesday and thursday of this past week.    Prior Function Level of Independence: Needs assistance   Gait / Transfers Assistance Needed: recently, nursing has had to help her mobilize to/from dining hall and exercise classes  ADL's / Homemaking Assistance Needed: has recently needed assistance from nursing        Hand  Dominance        Extremity/Trunk Assessment   Upper Extremity Assessment Upper Extremity Assessment: Generalized weakness    Lower Extremity Assessment Lower Extremity Assessment: Generalized weakness    Cervical / Trunk Assessment Cervical / Trunk Assessment: Kyphotic  Communication   Communication: No difficulties  Cognition Arousal/Alertness: Awake/alert Behavior During Therapy: Flat affect;Impulsive Overall Cognitive Status: History of  cognitive impairments - at baseline Area of Impairment: Orientation;Attention;Memory;Following commands;Safety/judgement;Awareness;Problem solving                 Orientation Level: Disoriented to;Place;Time;Situation Current Attention Level: Sustained Memory: Decreased short-term memory;Decreased recall of precautions Following Commands: Follows one step commands inconsistently;Follows one step commands with increased time Safety/Judgement: Decreased awareness of safety;Decreased awareness of deficits Awareness: Intellectual Problem Solving: Slow processing;Decreased initiation;Difficulty sequencing;Requires verbal cues;Requires tactile cues General Comments: very slow processing and very easily distractable- had to redirect her from pulling at pulse ox line and IV multiple times. Only able to follow cues approximately 40% of the time today. Seems to try to compensate for things she does not know or is not sure how to do by saying "no that's too much to do " or joking      General Comments General comments (skin integrity, edema, etc.): VSS on RA    Exercises     Assessment/Plan    PT Assessment Patient needs continued PT services  PT Problem List Decreased strength;Decreased cognition;Decreased activity tolerance;Decreased safety awareness;Decreased balance;Decreased mobility;Decreased coordination       PT Treatment Interventions DME instruction;Balance training;Gait training;Neuromuscular re-education;Stair training;Functional mobility training;Patient/family education;Therapeutic exercise;Therapeutic activities;Cognitive remediation    PT Goals (Current goals can be found in the Care Plan section)  Acute Rehab PT Goals Patient Stated Goal: go to rehab PT Goal Formulation: With family Time For Goal Achievement: 12/13/20 Potential to Achieve Goals: Good    Frequency Min 3X/week   Barriers to discharge        Co-evaluation               AM-PAC PT "6 Clicks"  Mobility  Outcome Measure Help needed turning from your back to your side while in a flat bed without using bedrails?: A Little Help needed moving from lying on your back to sitting on the side of a flat bed without using bedrails?: A Little Help needed moving to and from a bed to a chair (including a wheelchair)?: A Lot Help needed standing up from a chair using your arms (e.g., wheelchair or bedside chair)?: A Lot Help needed to walk in hospital room?: A Lot Help needed climbing 3-5 steps with a railing? : Total 6 Click Score: 13    End of Session   Activity Tolerance: Patient tolerated treatment well Patient left: in bed;with call bell/phone within reach;with bed alarm set;Other (comment) (ED stretcher with chair alarm under her as "bed alarm") Nurse Communication: Mobility status PT Visit Diagnosis: Unsteadiness on feet (R26.81);Muscle weakness (generalized) (M62.81);Difficulty in walking, not elsewhere classified (R26.2);Other symptoms and signs involving the nervous system (R29.898);History of falling (Z91.81)    Time: 5277-8242 PT Time Calculation (min) (ACUTE ONLY): 25 min   Charges:   PT Evaluation $PT Eval Moderate Complexity: 1 Mod PT Treatments $Therapeutic Activity: 8-22 mins       Windell Norfolk, DPT, PN2   Supplemental Physical Therapist [place]    Pager [phone] Acute Rehab Office [phone]

## 2020-11-29 NOTE — Progress Notes (Signed)
Carotid duplex has been completed.  Results can be found under chart review under CV PROC. 11/29/2020 4:21 PM Chick Cousins RVT, RDMS

## 2020-11-29 NOTE — Procedures (Signed)
Patient Name: Ann Johns  MRN: [id number]  Epilepsy Attending: Charlsie Quest  Referring Physician/Provider: Janey Genta, NP  Date: 11/29/2020 Duration: 25.17 mins  Patient history: 87yo F with ams. EEG to evaluate for seizure  Level of alertness: Awake, asleep  AEDs during EEG study: None  Technical aspects: This EEG study was done with scalp electrodes positioned according to the 10-20 International system of electrode placement. Electrical activity was acquired at a sampling rate of 500Hz  and reviewed with a high frequency filter of 70Hz  and a low frequency filter of 1Hz . EEG data were recorded continuously and digitally stored.   Description: The posterior dominant rhythm consists of 7.5 Hz activity of moderate voltage (25-35 uV) seen predominantly in posterior head regions, symmetric and reactive to eye opening and eye closing. Sleep was characterized by vertex waves, sleep spindles (12 to 14 Hz), maximal frontocentral region. EEG showed intermittent generalized 3 to 6 Hz theta-delta slowing. Hyperventilation and photic stimulation were not performed.     ABNORMALITY - Intermittent slow, generalized  IMPRESSION: This study is suggestive of mild diffuse encephalopathy, nonspecific etiology. No seizures or epileptiform discharges were seen throughout the recording.  Alfio Loescher 

## 2020-11-29 NOTE — ED Notes (Signed)
Resituated, re-oriented, no family at [facility], visualized from nurses station, remdesivir stopped, IVF restarted.

## 2020-11-29 NOTE — Progress Notes (Signed)
EEG done at bedside. Results pending.  

## 2020-11-29 NOTE — ED Notes (Signed)
DIL at [facility]. Vasc at [facility] for carotid study. Haldol given for increasing restlessness.

## 2020-11-29 NOTE — ED Notes (Signed)
Walked into pt's room to find pt attempting to get out of bed. IV site infiltrated, pt pulling at IV, pt stating she is trying to get dressed. Reoriented pt and changed sheets/brief/purewick and provided with warm blankets. Removed IV and placed a new IV and blood work performed for the morning labs.

## 2020-11-29 NOTE — ED Notes (Signed)
Pt found sitting up in bed with legs hanging out of the side rails. Pt placed back in bed and back on monitor. Educated pt on importance of staying in bed and using the call bell. Advised pt that it was a fall risk and it was very important for her to stay in the bed and use call bell. Pt verbalized understanding at this time

## 2020-11-30 DIAGNOSIS — N3000 Acute cystitis without hematuria: Secondary | ICD-10-CM | POA: Diagnosis not present

## 2020-11-30 DIAGNOSIS — Z8679 Personal history of other diseases of the circulatory system: Secondary | ICD-10-CM | POA: Diagnosis not present

## 2020-11-30 DIAGNOSIS — U071 COVID-19: Secondary | ICD-10-CM | POA: Diagnosis not present

## 2020-11-30 DIAGNOSIS — I63432 Cerebral infarction due to embolism of left posterior cerebral artery: Secondary | ICD-10-CM | POA: Diagnosis not present

## 2020-11-30 DIAGNOSIS — I639 Cerebral infarction, unspecified: Secondary | ICD-10-CM | POA: Diagnosis not present

## 2020-11-30 LAB — D-DIMER, QUANTITATIVE: D-Dimer, Quant: 0.71 ug/mL-FEU — ABNORMAL HIGH (ref 0.00–0.50)

## 2020-11-30 LAB — URINE CULTURE: Culture: <10000 — AB

## 2020-11-30 LAB — BRAIN NATRIURETIC PEPTIDE: B Natriuretic Peptide: 268 pg/mL — ABNORMAL HIGH (ref 0.0–100.0)

## 2020-11-30 MED ORDER — DEXAMETHASONE SODIUM PHOSPHATE 10 MG/ML IJ SOLN
6.0000 mg | INTRAMUSCULAR | Status: AC
  Administered 2020-11-30: 6 mg via INTRAVENOUS
  Filled 2020-11-30: qty 1

## 2020-11-30 MED ORDER — FUROSEMIDE 10 MG/ML IJ SOLN
20.0000 mg | Freq: Once | INTRAMUSCULAR | Status: AC
  Administered 2020-11-30: 20 mg via INTRAVENOUS
  Filled 2020-11-30: qty 4

## 2020-11-30 MED ORDER — ORAL CARE MOUTH RINSE
15.0000 mL | Freq: Two times a day (BID) | OROMUCOSAL | Status: DC
  Administered 2020-11-30 – 2020-12-09 (×18): 15 mL via OROMUCOSAL

## 2020-11-30 MED ORDER — POTASSIUM CHLORIDE CRYS ER 20 MEQ PO TBCR
20.0000 meq | EXTENDED_RELEASE_TABLET | Freq: Once | ORAL | Status: AC
  Administered 2020-11-30: 20 meq via ORAL
  Filled 2020-11-30: qty 1

## 2020-11-30 MED ORDER — DM-GUAIFENESIN ER 30-600 MG PO TB12
1.0000 | ORAL_TABLET | Freq: Two times a day (BID) | ORAL | Status: DC
  Administered 2020-11-30 – 2020-12-09 (×20): 1 via ORAL
  Filled 2020-11-30 (×18): qty 1

## 2020-11-30 NOTE — Progress Notes (Addendum)
STROKE TEAM PROGRESS NOTE   ATTENDING NOTE: I reviewed above note and agree with the assessment and plan. Pt was seen and examined.   87 year old female with history of hypertension, right frontal ICH in 2019, seizure associate with ICH, early stage of dementia admitted for coughing, fell x 2 at independent living facility.  Found to have COVID-positive. CT head questionable left frontal stroke.  MRI showed moderate acute left PCA infarct, and bifrontal chronic hemorrhage, right more than left.  MRA head mild right P2 stenosis.  Carotid Doppler negative, EF 70 to 75%.  EEG no seizure.  A1c 5.7, LDL 133.  On exam, patient son at bedside, patient initially sleeping but easily arousable, awake, alert, eyes open, orientated to self, people, but not orientated to place, time and age. No aphasia but paucity of speech, following all simple commands. Able to name and repeat . No gaze palsy, however, seems to have right hemianopia, not blinking to visual threat on the right, PERRL. No facial droop. Tongue midline. Bilateral UEs at least 4/5, no drift. Bilaterally LEs at least 3+/5, no drift. Sensation symmetrical bilaterally, b/l FTN intact grossly but with b/l hand tremor left > right, gait not tested.   Etiology for patient stroke concerning for embolic pattern.  However, given patient history of ICH, dementia, advanced age and falls, patient not a good candidate for anticoagulation.  We will continue aspirin 81 and Plavix 75 DAPT for 3 weeks and then aspirin alone.  Continue Lipitor 40.  PT/OT recommend SNF.  COVID management per primary team.  For detailed assessment and plan, please refer to above as I have made changes wherever appropriate.   Neurology will sign off. Please call with questions. Pt will follow up with neuro NP at [facility] in about 4 weeks. Thanks for the consult.   Marvel Plan, MD PhD Stroke Neurology 11/30/2020 8:09 PM    INTERVAL HISTORY No acute events. Son at bedside.  She is much  improved today. Awake, alert sitting up in bed holding her stuffed dog in her lap. She remarks that he has messy hair and doesn't have a name. She does not recall the events that led to her admission. She is coughing intermittently non-productively with unlabored breathing. Son reports she has seemed to have a better day today. We discussed her plan of care.Questions were answered.   Vitals:   11/29/20 2355 11/30/20 0600 11/30/20 0957 11/30/20 1226  BP: (!) 153/88 (!) 139/91 (!) 165/98 133/88  Pulse: (!) 105 93 100 (!) 105  Resp:  15 18 20   Temp: 98.4 F (36.9 C) 98.3 F (36.8 C) 98.1 F (36.7 C) 97.6 F (36.4 C)  TempSrc: Oral Oral  Oral  SpO2: 98% 98% 98% 97%  Weight:      Height:       CBC:  Recent Labs  Lab 11/28/20 1051 11/28/20 1701 11/29/20 0219  WBC 10.0 8.0 7.4  NEUTROABS 7.5  --  6.2  HGB 11.9* 12.1 12.7  HCT 35.4* 38.8 39.1  MCV 91.5 99.0 93.3  PLT 250 259 269   Basic Metabolic Panel:  Recent Labs  Lab 11/28/20 1051 11/28/20 1701 11/29/20 0219  NA 131*  --  135  K 3.7  --  3.9  CL 98  --  103  CO2 22  --  17*  GLUCOSE 116*  --  184*  BUN 9  --  7*  CREATININE 0.45 0.46 0.62  CALCIUM 8.8*  --  9.0  MG  --   --  1.8   Lipid Panel:  Recent Labs  Lab 11/29/20 0219  CHOL 196  TRIG 75  HDL 48  CHOLHDL 4.1  VLDL 15  LDLCALC 130*   HgbA1c:  Recent Labs  Lab 11/29/20 0219  HGBA1C 5.7*   Urine Drug Screen: No results for input(s): LABOPIA, COCAINSCRNUR, LABBENZ, AMPHETMU, THCU, LABBARB in the last 168 hours.  Alcohol Level No results for input(s): ETH in the last 168 hours.  IMAGING past 24 hours No results found.  PHYSICAL EXAM  Neurological Exam :  Temp:  [97.6 F (36.4 C)-98.4 F (36.9 C)] 97.6 F (36.4 C) (10/15 1226) Pulse Rate:  [91-105] 105 (10/15 1226) Resp:  [15-23] 20 (10/15 1226) BP: (133-166)/(77-98) 133/88 (10/15 1226) SpO2:  [97 %-98 %] 97 % (10/15 1226) Weight:  [51.2 kg] 51.2 kg (10/14 2011)  General - Frail  appearing thin female sitting up in bed in NAD.   Ophthalmologic - fundi not visualized due to noncooperation.  Cardiovascular - Regular rhythm and rate.  Mental Status -  Alert. Pleasantly disoriented to time, place and situation (baseline). Oriented to self and son. Follows some simple commands but not complex commands.  Speaks only a few words at a time, no sentences. Does not spontaneously initiate conversation. Speech is clear.  Attention span and concentration were impaired. Recent and remote memory were impaired Fund of Knowledge was assessed and was impaired.  Cranial Nerves II - XII - II - Does not follow commands for visual field testing  III, IV, VI - Extraocular movements intact. V - Facial sensation intact bilaterally. VII - Facial movement intact bilaterally. VIII - Hearing grossly intact bilaterally. X - Palate elevates symmetrically. XI - Chin turning & shoulder shrug intact bilaterally. XII - Tongue protrusion intact.  Motor Strength - The patient's strength was 4/5 in all extremities and pronator drift was absent.  Bulk was normal and fasciculations were absent.   Motor Tone - Muscle tone was assessed at the neck and appendages and was normal.  Sensory - Light touch intact x 4 extremities   Coordination - The patient had normal movements in the hands and feet with no ataxia or dysmetria.  Tremor was present in bilat UE with intention.  Gait and Station - deferred.   ASSESSMENT/PLAN  Ann Johns  is a 87 y.o. female, with history of intracerebral hemorrhage in 2019, seizures assoc with ICH, early dementia, essential hypertension who lives at an assisted living facility was brought in by her son to the hospital for evaluation.  According to the son she  lives in ALF with medication tech to prepare meds. Found on the floor 10/12 am at the ALF without apparent injury. She had been coughing on the night prior to admission. Then was found to have fallen again this  morning. She was brought to [facility] ED where UTI, COVID + and stroke was found. Neurology was consulted.    Stroke: Moderate-sized acute/early subacute left PCA territory infarct affecting the cortical/subcortical left parietooccipital lobes and callosal splenium etiology likely thromboembolic in the setting of COVID infection and intracranial atherosclerosis  Code Stroke CT head: 1. Low-attenuation changes in the left frontal lobe is new from prior and likely represents a late subacute to chronic left ACA territory infarction. Further evaluation with MRI of the brain is recommended. 2. No intracranial hemorrhage identified. 3. Chronic right anterior frontal lobe infarct is unchanged. 4. Right periorbital soft tissue swelling. 5. Paranasal sinus disease as described.  MRI   Moderate-sized acute/early subacute  left PCA territory infarct affecting the cortical/subcortical left parietooccipital lobes and callosal splenium. Redemonstrated chronic encephalomalacia and associated chronic blood products in the anterior frontal lobes, and anterior right temporal lobe, presumably posttraumatic in etiology. Chronic small-vessel ischemic changes which are severe in the cerebral white matter, and mild in the pons. Mild-to-moderate generalized cerebral atrophy. Comparatively mild cerebellar atrophy. Paranasal sinus disease, as described.   MRA   No emergent large vessel occlusion or high-grade stenosis. Mild narrowing of the right PCA P2 segment. Carotid Doppler  Right Carotid: Velocities in the right ICA are consistent with a 1-39%  stenosis. The extracranial vessels were near-normal with only minimal wall thickening or plaque.  Left Carotid: Velocities in the left ICA are consistent with a 1-39%  stenosis. The extracranial vessels were near-normal with only minimal wall thickening or plaque.  Vertebrals:  Bilateral vertebral arteries demonstrate antegrade flow.  Subclavians: Normal flow  hemodynamics were seen in bilateral subclavian arteries.  2D Echo  . Left ventricular ejection fraction, by estimation, is 70 to 75%. The  left ventricle has hyperdynamic function. The left ventricle has no  regional wall motion abnormalities. There is moderate asymmetric left ventricular hypertrophy of the basal-septal   segment. Indeterminate diastolic filling due to E-A fusion.   2. Right ventricular systolic function is normal. The right ventricular  size is normal.   3. The mitral valve is grossly normal. Mild mitral valve regurgitation.  No evidence of mitral stenosis.   4. The aortic valve was not well visualized. There is mild calcification  of the aortic valve. Aortic valve regurgitation is mild to moderate. No  aortic stenosis is present.  The interatrial septum was not well visualized.  EEG This study is suggestive of mild diffuse encephalopathy, nonspecific etiology. No seizures or epileptiform discharges were seen throughout the recording LDL 133 HgbA1c 5.7 VTE prophylaxis - SCDs, recommended     Diet   DIET SOFT Room service appropriate? Yes with Assist; Fluid consistency: Nectar Thick  Not on anticoagulant or antiplatelet prior to admission is setting of history of ICH 2019.  Recommend recommend aspirin and Plavix for 3 weeks followed by aspirin alone  Therapy recommendations:  SNF Disposition:  SNF pending  BP Management Home meds:  None, no hx of HTN Stable Permissive hypertension (OK if < 220/120) but gradually normalize in 5-7 days Long-term BP goal normotensive  Hyperlipidemia Home meds:  None  LDL 133, goal < 70  High intensity statin: Lipitor 40mg   Continue statin at discharge       Seizure  Seizure was presenting symptom during ICH episode AUG 2019 She was tapered off Keppra in Aug 2022 by Dr. Sep 2022 EEG: no epileptiform activity intermittent slow generalized.       Dementia On Namenda, taken off Aricept for unclear reasons per chart review (Dr.  Anne Hahn office note). Continue home Namenda  Delirium precautions   Other Stroke Risk Factors Advanced Age >/= 80  Former Cigarette smoker Hx of ICH August 2019  Other Active Problems   Hospital day # 2  Dr. September 2019 directed the plan of care.  Roda Shutters, NP-C   To contact Stroke Continuity provider, please refer to [facility]. After hours, contact General Neurology

## 2020-11-30 NOTE — Progress Notes (Signed)
PROGRESS NOTE                                                                                                                                                                                                             Patient Demographics:    Ann Johns, is a 87 y.o. female, DOB - 1933-04-23, WUJ:[id number]  Outpatient Primary MD for the patient is Lewis Moccasin, MD    LOS - 2  Admit date - 11/28/2020    Chief Complaint  Patient presents with   Fall   Altered Mental Status       Brief Narrative (HPI from H&P)   Ann Johns  is a 87 y.o. female, with history of intracerebral hemorrhage in 2019, seizures, early dementia, essential hypertension who lives at an assisted living facility was brought in by her son to the hospital for evaluation.  According to the son she was in today morning around 9 AM when she had a fall at assisted living facility, she subsequently did well however she was feeling weak the whole day, this morning she had another 4 at assisted living facility after which she was brought to the [facility] ER where she was found to have an acute stroke, UTI and a COVID-19 infection.  Neurology was consulted and I was requested to admit the patient to admit the patient   Subjective:   Patient in bed, appears comfortable, denies any headache, no fever, no chest pain or pressure, no shortness of breath , no abdominal pain. No new focal weakness.   Assessment  & Plan :     1. Left PCA - CVA in the Subcortical left parietooccipital lobes and callosal splenium - causing x 2 falls, starting over 36 hrs before admission - she thankfully has mild deficits, Neuro following, ASA - Statin for now, PT-OT-SLP, may need SNF.   2. UTI - Rocephin x 3, follow cultures   3.  COVID infection.  She has had 2 doses of vaccine so far, she does have a dry cough, will check inflammatory markers for now Decadron IV and remdesivir and  monitor closely.  Son has consented for for treatment including Actemra if needed.   4.  Hypertension.  Allow for permissive hypertension due to #1 above.   5.  Dementia.  At risk for delirium.  Continue Namenda at  home dose, minimize benzodiazepines and narcotics, son warned about possible worsening delirium while in the hospital.   SpO2: 98 %  Recent Labs  Lab 11/28/20 1014 11/28/20 1051 11/28/20 1701 11/29/20 0219 11/30/20 0453  WBC  --  10.0 8.0 7.4  --   HGB  --  11.9* 12.1 12.7  --   HCT  --  35.4* 38.8 39.1  --   PLT  --  250 259 269  --   CRP  --   --  11.5* 13.5*  --   BNP  --   --   --  60.9 268.0*  DDIMER  --   --  1.45* 1.15* 0.71*  AST  --   --   --  25  --   ALT  --   --   --  15  --   ALKPHOS  --   --   --  75  --   BILITOT  --   --   --  1.3*  --   ALBUMIN  --   --   --  3.6  --   SARSCOV2NAA POSITIVE*  --   --   --   --            Condition - Extremely Guarded  Family Communication  :  son at the time of admission, [phone] message left 11/29/20 at 11.44 pm, 11/30/20 .12 am - answering machine - message left  Code Status :  Full  Consults  :  Neuro  PUD Prophylaxis :    Procedures  :     MRI - Moderate-sized acute/early subacute left PCA territory infarct affecting the cortical/subcortical left parietooccipital lobes and callosal splenium. Redemonstrated chronic encephalomalacia and associated chronic blood products in the anterior frontal lobes, and anterior right temporal lobe, presumably posttraumatic in etiology. Chronic small-vessel ischemic changes which are severe in the cerebral white matter, and mild in the pons. Mild-to-moderate generalized cerebral atrophy. Comparatively mild cerebellar atrophy. Paranasal sinus disease  MRA - 1. No emergent large vessel occlusion or high-grade stenosis. 2. Mild narrowing of the right PCA P2 segment.    EEG - non acute  Carotid US - stable.  TTE - 1. Left ventricular ejection fraction, by  estimation, is 70 to 75%. The left ventricle has hyperdynamic function. The left ventricle has no regional wall motion abnormalities. There is moderate asymmetric left ventricular hypertrophy of the basal-septal  segment. Indeterminate diastolic filling due to E-A fusion.  2. Right ventricular systolic function is normal. The right ventricular size is normal.  3. The mitral valve is grossly normal. Mild mitral valve regurgitation. No evidence of mitral stenosis.  4. The aortic valve was not well visualized. There is mild calcification of the aortic valve. Aortic valve regurgitation is mild to moderate. No aortic stenosis is present      Disposition Plan  :    Status is: Inpatient - Remains inpatient appropriate because: CVA , Covid, placement  DVT Prophylaxis  :    heparin injection 5,000 Units Start: 11/28/20 1715    Lab Results  Component Value Date   PLT 269 11/29/2020    Diet :  Diet Order             DIET SOFT Room service appropriate? Yes; Fluid consistency: Nectar Thick  Diet effective now                    Inpatient Medications  Scheduled Meds:  aspirin  81  mg Oral Daily   atorvastatin  40 mg Oral Daily   cholecalciferol  75 mcg Oral Daily   clopidogrel  75 mg Oral Daily   dexamethasone (DECADRON) injection  6 mg Intravenous Q24H   heparin  5,000 Units Subcutaneous Q8H   memantine  10 mg Oral BID   multivitamin with minerals  1 tablet Oral Daily   Continuous Infusions:  cefTRIAXone (ROCEPHIN)  IV Stopped (11/29/20 1731)   remdesivir 100 mg in NS 100 mL 100 mg (11/30/20 0835)   PRN Meds:.acetaminophen **OR** acetaminophen, bisacodyl, haloperidol lactate, ondansetron **OR** ondansetron (ZOFRAN) IV  Antibiotics  :    Anti-infectives (From admission, onward)    Start     Dose/Rate Route Frequency Ordered Stop   11/29/20 1000  remdesivir 100 mg in sodium chloride 0.9 % 100 mL IVPB       See Hyperspace for full Linked Orders Report.   100 mg 200 mL/hr over  30 Minutes Intravenous Daily 11/28/20 1743 12/03/20 0959   11/28/20 1900  remdesivir 200 mg in sodium chloride 0.9% 250 mL IVPB       See Hyperspace for full Linked Orders Report.   200 mg 580 mL/hr over 30 Minutes Intravenous Once 11/28/20 1743 11/28/20 2232   11/28/20 1715  cefTRIAXone (ROCEPHIN) 1 g in sodium chloride 0.9 % 100 mL IVPB        1 g 200 mL/hr over 30 Minutes Intravenous Every 24 hours 11/28/20 1700 12/01/20 1714   11/28/20 1330  cephALEXin (KEFLEX) capsule 500 mg  Status:  Discontinued        500 mg Oral Every 8 hours 11/28/20 1328 11/28/20 1700        Time Spent in minutes  30   Susa Raring M.D on 11/30/2020 at 11:10 AM  To page go to [email or web address]   [facility] -  Office  [phone]  See all Orders from today for further details    Objective:   Vitals:   11/29/20 2011 11/29/20 2355 11/30/20 0600 11/30/20 0957  BP: (!) 166/84 (!) 153/88 (!) 139/91 (!) 165/98  Pulse: 91 (!) 105 93 100  Resp: 20  15 18   Temp: 98.2 F (36.8 C) 98.4 F (36.9 C) 98.3 F (36.8 C) 98.1 F (36.7 C)  TempSrc: Oral Oral Oral   SpO2: 97% 98% 98% 98%  Weight: 51.2 kg     Height:        Wt Readings from Last 3 Encounters:  11/29/20 51.2 kg  10/03/20 51.7 kg  04/02/20 54 kg     Intake/Output Summary (Last 24 hours) at 11/30/2020 1110 Last data filed at 11/30/2020 0959 Gross per 24 hour  Intake 1380 ml  Output 700 ml  Net 680 ml     Physical Exam  Awake Alert x 1, No new F.N deficits,  [place].AT,PERRAL Supple Neck, No JVD,   Symmetrical Chest wall movement, Good air movement bilaterally, CTAB RRR,No Gallops, Rubs or new Murmurs,  +ve B.Sounds, Abd Soft, No tenderness,   No Cyanosis, Clubbing or edema,       Data Review:    CBC Recent Labs  Lab 11/28/20 1051 11/28/20 1701 11/29/20 0219  WBC 10.0 8.0 7.4  HGB 11.9* 12.1 12.7  HCT 35.4* 38.8 39.1  PLT 250 259 269  MCV 91.5 99.0 93.3  MCH 30.7 30.9 30.3  MCHC 33.6 31.2 32.5  RDW 12.4 12.3  12.4  LYMPHSABS 1.3  --  1.0  MONOABS 1.1*  --  0.2  EOSABS  0.0  --  0.0  BASOSABS 0.0  --  0.0    Recent Labs  Lab 11/28/20 1051 11/28/20 1701 11/29/20 0219 11/30/20 0453  NA 131*  --  135  --   K 3.7  --  3.9  --   CL 98  --  103  --   CO2 22  --  17*  --   GLUCOSE 116*  --  184*  --   BUN 9  --  7*  --   CREATININE 0.45 0.46 0.62  --   CALCIUM 8.8*  --  9.0  --   AST  --   --  25  --   ALT  --   --  15  --   ALKPHOS  --   --  75  --   BILITOT  --   --  1.3*  --   ALBUMIN  --   --  3.6  --   MG  --   --  1.8  --   CRP  --  11.5* 13.5*  --   DDIMER  --  1.45* 1.15* 0.71*  HGBA1C  --   --  5.7*  --   BNP  --   --  60.9 268.0*    ------------------------------------------------------------------------------------------------------------------ Recent Labs    11/29/20 0219  CHOL 196  HDL 48  LDLCALC 133*  TRIG 75  CHOLHDL 4.1    Lab Results  Component Value Date   HGBA1C 5.7 (H) 11/29/2020   ------------------------------------------------------------------------------------------------------------------ No results for input(s): TSH, T4TOTAL, T3FREE, THYROIDAB in the last 72 hours.  Invalid input(s): FREET3  Cardiac Enzymes No results for input(s): CKMB, TROPONINI, MYOGLOBIN in the last 168 hours.  Invalid input(s): CK ------------------------------------------------------------------------------------------------------------------    Component Value Date/Time   BNP 268.0 (H) 11/30/2020 0453     Radiology Reports DG Chest 1 View  Result Date: 11/28/2020 CLINICAL DATA:  Stroke.  Pre MRI metal screening EXAM: CHEST  1 VIEW; PELVIS - 1-2 VIEW; ABDOMEN - 1 VIEW COMPARISON:  None. FINDINGS: The heart and mediastinal contours are within normal limits. Aortic calcification. 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 No focal consolidation. No pulmonary edema. No pleural effusion. No pneumothorax. Nonobstructive bowel gas pattern. No acute osseous  abnormality. Multilevel degenerative changes of the thoracolumbar spine. No retained radiopaque foreign body. IMPRESSION: 1. No retained radiopaque foreign body. 2. No acute cardiopulmonary disease. 3. Nonobstructive bowel gas pattern. 4.  Aortic Atherosclerosis (ICD10-I70.0). Electronically Signed   By: Tish Frederickson M.D.   On: 11/28/2020 15:19   DG Pelvis 1-2 Views  Result Date: 11/28/2020 CLINICAL DATA:  Stroke.  Pre MRI metal screening EXAM: CHEST  1 VIEW; PELVIS - 1-2 VIEW; ABDOMEN - 1 VIEW COMPARISON:  None. FINDINGS: The heart and mediastinal contours are within normal limits. Aortic calcification. 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 No focal consolidation. No pulmonary edema. No pleural effusion. No pneumothorax. Nonobstructive bowel gas pattern. No acute osseous abnormality. Multilevel degenerative changes of the thoracolumbar spine. No retained radiopaque foreign body. IMPRESSION: 1. No retained radiopaque foreign body. 2. No acute cardiopulmonary disease. 3. Nonobstructive bowel gas pattern. 4.  Aortic Atherosclerosis (ICD10-I70.0). Electronically Signed   By: Tish Frederickson M.D.   On: 11/28/2020 15:19   DG Abd 1 View  Result Date: 11/28/2020 CLINICAL DATA:  Stroke.  Pre MRI metal screening EXAM: CHEST  1 VIEW; PELVIS - 1-2 VIEW; ABDOMEN - 1 VIEW COMPARISON:  None. FINDINGS: The heart and mediastinal contours are within normal  limits. Aortic calcification. 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 No focal consolidation. No pulmonary edema. No pleural effusion. No pneumothorax. Nonobstructive bowel gas pattern. No acute osseous abnormality. Multilevel degenerative changes of the thoracolumbar spine. No retained radiopaque foreign body. IMPRESSION: 1. No retained radiopaque foreign body. 2. No acute cardiopulmonary disease. 3. Nonobstructive bowel gas pattern. 4.  Aortic Atherosclerosis (ICD10-I70.0). Electronically Signed   By: Tish Frederickson M.D.   On: 11/28/2020 15:19    CT HEAD WO CONTRAST ( )  Result Date: 11/28/2020 CLINICAL DATA:  Head trauma, minor (Age >= 65y) hx of dementia with repeated falls recently, hematoma near right eye, evaluate for ICH EXAM: CT HEAD WITHOUT CONTRAST TECHNIQUE: Contiguous axial images were obtained from the base of the skull through the vertex without intravenous contrast. COMPARISON:  MRI 04/16/2020, CT 12/24/2018 FINDINGS: Brain: New area of low attenuation in the anteroinferior left frontal lobe with loss of gray-white differentiation and developing encephalomalacia is new from prior and likely represents a late subacute or chronic left ACA territory infarction. Chronic right anterior frontal lobe infarct is unchanged. No intracranial hemorrhage. No extra-axial collection. Stable size and configuration of the ventricles. No mass lesion or mass effect. Extensive low-density changes within the periventricular and subcortical white matter compatible with chronic microvascular ischemic change. Mild diffuse cerebral volume loss. Vascular: Atherosclerotic calcifications involving the large vessels of the skull base. No unexpected hyperdense vessel. Skull: Normal. Negative for fracture or focal lesion. Sinuses/Orbits: Mucosal thickening and partial opacification of the right maxillary sinus. There is extensive mucosal thickening within the bilateral ethmoid air cells. Orbital structures within normal limits. Other: Right periorbital soft tissue swelling. IMPRESSION: 1. Low-attenuation changes in the left frontal lobe is new from prior and likely represents a late subacute to chronic left ACA territory infarction. Further evaluation with MRI of the brain is recommended. 2. No intracranial hemorrhage identified. 3. Chronic right anterior frontal lobe infarct is unchanged. 4. Right periorbital soft tissue swelling. 5. Paranasal sinus disease as described. Electronically Signed   By: Duanne Guess D.O.   On: 11/28/2020 12:58   MR ANGIO HEAD WO  CONTRAST  Result Date: 11/28/2020 CLINICAL DATA:  Acute neurologic deficit EXAM: MRA HEAD WITHOUT CONTRAST TECHNIQUE: Angiographic images of the Circle of Willis were acquired using MRA technique without intravenous contrast. COMPARISON:  No pertinent prior exam. FINDINGS: POSTERIOR CIRCULATION: --Vertebral arteries: Normal --Inferior cerebellar arteries: Normal. --Basilar artery: Normal. --Superior cerebellar arteries: Normal. --Posterior cerebral arteries: Mild narrowing of the right PCA P2 segment. Normal left. ANTERIOR CIRCULATION: --Intracranial internal carotid arteries: Normal. --Anterior cerebral arteries (ACA): Normal. --Middle cerebral arteries (MCA): Normal. ANATOMIC VARIANTS: None IMPRESSION: 1. No emergent large vessel occlusion or high-grade stenosis. 2. Mild narrowing of the right PCA P2 segment. Electronically Signed   By: Deatra Robinson M.D.   On: 11/28/2020 23:20   MR BRAIN WO CONTRAST  Result Date: 11/28/2020 CLINICAL DATA:  Stroke, follow-up; CT head with question of evolving or new subacute ACA infarct, patient with history of dementia and prior CVA, follow-up imaging. EXAM: MRI HEAD WITHOUT CONTRAST TECHNIQUE: Multiplanar, multiecho pulse sequences of the brain and surrounding structures were obtained without intravenous contrast. COMPARISON:  Prior head CT 11/28/2020.  Brain MRI 04/16/2020. FINDINGS: Brain: Mild to moderate generalized cerebral atrophy. Comparatively mild cerebellar atrophy. Moderate-sized acute/early subacute infarct within the cortical/subcortical left parietooccipital lobes and within the callosal splenium (PCA vascular territory). Redemonstrated chronic foci of encephalomalacia within the anterior frontal lobes and anterior right temporal lobe, with associated chronic blood products.  Background advanced patchy and confluent T2 FLAIR hyperintense signal abnormality within the cerebral white matter, nonspecific but compatible with chronic small vessel ischemic  disease. Mild chronic small vessel ischemic changes are also present within the pons. No evidence of an intracranial mass. No extra-axial fluid collection. No midline shift. Partially empty sella turcica. Vascular: No definite loss of expected proximal arterial flow voids. Skull and upper cervical spine: No focal suspicious marrow lesion. Incompletely assessed cervical spondylosis. Sinuses/Orbits: Visualized orbits show no acute finding. Mild-to-moderate mucosal thickening within the bilateral ethmoid and right maxillary sinuses. Superimposed small volume frothy secretions within the right maxillary sinus. Mild mucosal thickening also present within the bilateral sphenoid and left maxillary sinuses. Other: 13 mm left parietal scalp lesion, nonspecific but possibly reflecting a sebaceous cyst. IMPRESSION: Moderate-sized acute/early subacute left PCA territory infarct affecting the cortical/subcortical left parietooccipital lobes and callosal splenium. Redemonstrated chronic encephalomalacia and associated chronic blood products in the anterior frontal lobes, and anterior right temporal lobe, presumably posttraumatic in etiology. Chronic small-vessel ischemic changes which are severe in the cerebral white matter, and mild in the pons. Mild-to-moderate generalized cerebral atrophy. Comparatively mild cerebellar atrophy. Paranasal sinus disease, as described. Electronically Signed   By: Jackey Loge D.O.   On: 11/28/2020 16:02   DG Chest Port 1 View  Result Date: 11/29/2020 CLINICAL DATA:  Shortness of breath.  Altered mental status. EXAM: PORTABLE CHEST 1 VIEW COMPARISON:  11/28/2020 FINDINGS: The patient is rotated to the right with grossly unchanged cardiomediastinal silhouette. Eventration of the right hemidiaphragm is again noted. No confluent airspace opacity, edema, sizable pleural effusion, or pneumothorax is identified. Thoracic levoscoliosis is noted. IMPRESSION: No active disease. Electronically Signed    By: Sebastian Ache M.D.   On: 11/29/2020 05:31   EEG adult  Result Date: 11/29/2020 Charlsie Quest, MD     11/29/2020 12:33 PM Patient Name: Ann Johns MRN: [id number] Epilepsy Attending: Charlsie Quest Referring Physician/Provider: Janey Genta, NP Date: 11/29/2020 Duration: 25.17 mins Patient history: 87yo F with ams. EEG to evaluate for seizure Level of alertness: Awake, asleep AEDs during EEG study: None Technical aspects: This EEG study was done with scalp electrodes positioned according to the 10-20 International system of electrode placement. Electrical activity was acquired at a sampling rate of 500Hz  and reviewed with a high frequency filter of 70Hz  and a low frequency filter of 1Hz . EEG data were recorded continuously and digitally stored. Description: The posterior dominant rhythm consists of 7.5 Hz activity of moderate voltage (25-35 uV) seen predominantly in posterior head regions, symmetric and reactive to eye opening and eye closing. Sleep was characterized by vertex waves, sleep spindles (12 to 14 Hz), maximal frontocentral region. EEG showed intermittent generalized 3 to 6 Hz theta-delta slowing. Hyperventilation and photic stimulation were not performed.   ABNORMALITY - Intermittent slow, generalized IMPRESSION: This study is suggestive of mild diffuse encephalopathy, nonspecific etiology. No seizures or epileptiform discharges were seen throughout the recording.   ECHOCARDIOGRAM COMPLETE  Result Date: 11/29/2020    ECHOCARDIOGRAM REPORT   Patient Name:   Ann Johns Date of Exam: 11/29/2020 Medical Rec #:  12/01/2020     Height:       62.0 in Accession #:    [id number]    Weight:       114.0 lb Date of Birth:  January 16, 1934     BSA:          1.505 m Patient Age:    64 years  BP:           157/82 mmHg Patient Gender: F             HR:           98 bpm. Exam Location:  Inpatient Procedure: 2D Echo, Cardiac Doppler, Color Doppler and Intracardiac             Opacification Agent Indications:    Stroke I63.9  History:        Patient has prior history of Echocardiogram examinations, most                 recent 11/12/2017.  Sonographer:    Leta Jungling RDCS Referring Phys: Heide Scales [facility]  Sonographer Comments: Technically difficult study due to poor echo windows. IMPRESSIONS  1. Left ventricular ejection fraction, by estimation, is 70 to 75%. The left ventricle has hyperdynamic function. The left ventricle has no regional wall motion abnormalities. There is moderate asymmetric left ventricular hypertrophy of the basal-septal  segment. Indeterminate diastolic filling due to E-A fusion.  2. Right ventricular systolic function is normal. The right ventricular size is normal.  3. The mitral valve is grossly normal. Mild mitral valve regurgitation. No evidence of mitral stenosis.  4. The aortic valve was not well visualized. There is mild calcification of the aortic valve. Aortic valve regurgitation is mild to moderate. No aortic stenosis is present. Conclusion(s)/Recommendation(s): No intracardiac source of embolism detected on this transthoracic study. A transesophageal echocardiogram is recommended to exclude cardiac source of embolism if clinically indicated. FINDINGS  Left Ventricle: Left ventricular ejection fraction, by estimation, is 70 to 75%. The left ventricle has hyperdynamic function. The left ventricle has no regional wall motion abnormalities. Definity contrast agent was given IV to delineate the left ventricular endocardial borders. The left ventricular internal cavity size was normal in size. There is moderate asymmetric left ventricular hypertrophy of the basal-septal segment. Indeterminate diastolic filling due to E-A fusion. Right Ventricle: The right ventricular size is normal. No increase in right ventricular wall thickness. Right ventricular systolic function is normal. Left Atrium: Left atrial size was normal in size. Right Atrium: Right atrial  size was normal in size. Pericardium: There is no evidence of pericardial effusion. Presence of pericardial fat pad. Mitral Valve: The mitral valve is grossly normal. Mild mitral valve regurgitation. No evidence of mitral valve stenosis. Tricuspid Valve: The tricuspid valve is normal in structure. Tricuspid valve regurgitation is mild . No evidence of tricuspid stenosis. Aortic Valve: The aortic valve was not well visualized. There is mild calcification of the aortic valve. Aortic valve regurgitation is mild to moderate. No aortic stenosis is present. Pulmonic Valve: The pulmonic valve was not well visualized. Pulmonic valve regurgitation is mild to moderate. No evidence of pulmonic stenosis. Aorta: The aortic root is normal in size and structure. Venous: The inferior vena cava was not well visualized. IAS/Shunts: The interatrial septum was not well visualized.  LEFT VENTRICLE PLAX 2D LVIDd:         3.40 cm   Diastology LVIDs:         2.90 cm   LV e' medial:    5.00 cm/s LV PW:         0.90 cm   LV E/e' medial:  12.0 LV IVS:        1.30 cm   LV e' lateral:   5.66 cm/s LVOT diam:     2.10 cm   LV E/e' lateral: 10.6 LV SV:  38 LV SV Index:   25 LVOT Area:     3.46 cm  RIGHT VENTRICLE RV S prime:     16.80 cm/s LEFT ATRIUM           Index LA diam:      2.70 cm 1.79 cm/m LA Vol (A4C): 14.5 ml 9.63 ml/m  AORTIC VALVE LVOT Vmax:   72.70 cm/s LVOT Vmean:  49.000 cm/s LVOT VTI:    0.109 m  AORTA Ao Root diam: 3.30 cm Ao Asc diam:  3.20 cm MITRAL VALVE                TRICUSPID VALVE MV Area (PHT): 5.46 cm     TR Peak grad:   23.2 mmHg MV Decel Time: 139 msec     TR Vmax:        241.00 cm/s MV E velocity: 59.75 cm/s MV A velocity: 104.00 cm/s  SHUNTS MV E/A ratio:  0.57         Systemic VTI:  0.11 m                             Systemic Diam: 2.10 cm Weston Brass MD Electronically signed by Weston Brass MD Signature Date/Time: 11/29/2020/6:57:24 PM    Final    VAS US CAROTID (at [facility] and [facility] only)  Result  Date: 11/29/2020 Carotid Arterial Duplex Study Patient Name:  Ann Johns  Date of Exam:   11/29/2020 Medical Rec #: [id number]      Accession #:    [id number] Date of Birth: [birth year]      Patient Gender: F Patient Age:   15 years Exam Location:  [facility] [place] Procedure:      VAS US CAROTID Referring Phys: Terrilee Files [facility] --------------------------------------------------------------------------------  Indications:       CVA and recent falls, weakness, COVID+. Risk Factors:      Hypertension. Other Factors:     HX of ICH (2019). Comparison Study:  Previous exam 12/07/2017 1-39% BIL Performing Technologist: Ernestene Mention RVT, RDMS  Examination Guidelines: A complete evaluation includes B-mode imaging, spectral Doppler, color Doppler, and power Doppler as needed of all accessible portions of each vessel. Bilateral testing is considered an integral part of a complete examination. Limited examinations for reoccurring indications may be performed as noted.  Right Carotid Findings: +----------+--------+--------+--------+------------------+------------------+           PSV cm/sEDV cm/sStenosisPlaque DescriptionComments           +----------+--------+--------+--------+------------------+------------------+ CCA Prox  61      9                                 intimal thickening +----------+--------+--------+--------+------------------+------------------+ CCA Distal62      14                                intimal thickening +----------+--------+--------+--------+------------------+------------------+ ICA Prox  35      10              calcific and focaltortuous           +----------+--------+--------+--------+------------------+------------------+ ICA Distal74      17                                tortuous           +----------+--------+--------+--------+------------------+------------------+  ECA       65      0               calcific                              +----------+--------+--------+--------+------------------+------------------+ +----------+--------+-------+----------------+-------------------+           PSV cm/sEDV cmsDescribe        Arm Pressure (mmHG) +----------+--------+-------+----------------+-------------------+ [id number]             Multiphasic, WNL                    +----------+--------+-------+----------------+-------------------+ +---------+--------+--+--------+--+---------+ VertebralPSV cm/s58EDV cm/s11Antegrade +---------+--------+--+--------+--+---------+  Left Carotid Findings: +----------+--------+--------+--------+------------------+---------------------+           PSV cm/sEDV cm/sStenosisPlaque DescriptionComments              +----------+--------+--------+--------+------------------+---------------------+ CCA Prox  53      11                                                      +----------+--------+--------+--------+------------------+---------------------+ CCA Distal49      14                                intimal thickening    +----------+--------+--------+--------+------------------+---------------------+ ICA Prox  41      14              calcific          intimal thickening &                                                      tortuous              +----------+--------+--------+--------+------------------+---------------------+ ICA Distal101     27                                                      +----------+--------+--------+--------+------------------+---------------------+ ECA       40      0               calcific                                +----------+--------+--------+--------+------------------+---------------------+ +----------+--------+--------+----------------+-------------------+           PSV cm/sEDV cm/sDescribe        Arm Pressure (mmHG) +----------+--------+--------+----------------+-------------------+ Subclavian102              Multiphasic, WNL                    +----------+--------+--------+----------------+-------------------+ +---------+--------+--+--------+--+---------+ VertebralPSV cm/s52EDV cm/s11Antegrade +---------+--------+--+--------+--+---------+   Summary: Right Carotid: Velocities in the right ICA are consistent with a 1-39% stenosis.                The extracranial vessels were near-normal with only minimal wall  thickening or plaque. Left Carotid: Velocities in the left ICA are consistent with a 1-39% stenosis.               The extracranial vessels were near-normal with only minimal wall               thickening or plaque. Vertebrals:  Bilateral vertebral arteries demonstrate antegrade flow. Subclavians: Normal flow hemodynamics were seen in bilateral subclavian              arteries. *See table(s) above for measurements and observations.  Electronically signed by Sherald Hess MD on 11/29/2020 at 4:10:26 PM.    Final

## 2020-11-30 NOTE — Evaluation (Addendum)
Speech Language Pathology Evaluation Patient Details Name: Ann Johns MRN: [id number] DOB: 20-Jun-1933 Today's Date: 11/30/2020 Time: 1140-1210 SLP Time Calculation (min) (ACUTE ONLY): 30 min  Problem List:  Patient Active Problem List   Diagnosis Date Noted   CVA (cerebral vascular accident) (HCC) 11/28/2020   Memory change 10/03/2020   Cerebrovascular disease 10/03/2020   Seizures (HCC) 11/01/2017   ICH (intracerebral hemorrhage) (HCC) 11/01/2017   Past Medical History:  Past Medical History:  Diagnosis Date   Cerebrovascular disease 10/03/2020   ICH (intracerebral hemorrhage) (HCC) 11/01/2017   Right frontal   Memory change 10/03/2020   Seizures (HCC) 11/01/2017   Past Surgical History:  Past Surgical History:  Procedure Laterality Date   VAGINAL HYSTERECTOMY     HPI:  87yo female who presented on 10/13, brought to hospital by her son from her ALF after weakness and multiple falls. 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 PMH ICH right frontal lobe 2019, seizures, dementia, HTN.   Assessment / Plan / Recommendation Clinical Impression  Pt was seen for a cognitive-linguistic evaluation in the setting of L PCA CVA.  Pt was pleasant and cooperative throughout the evaluation and her son was present at bedside to assist with establishing a cognitive baseline.  Per son report, the pt lives in an independent living facility and requires assistance with IADLs (medications, finances, etc.) provided by intermittent caretakers and family.  Son reported that the pt has baseline short-term memory deficits secondary to previous CVA in 2019, and that she received PT, OT, and ST following CVA.  Pt completed portions of the SLUMS and WAB bedside in addition to informal evaluation measures.  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  She exhibited difficulty with naming (confrontational & divergent) and repetition of phrases and sentences.   Semantic and phonemic cues were beneficial.  Additionally, the pt exhibits cognitive deficits in the areas of short-term memory, attention, and problem solving.  Safety judgement was grossly functional, but pt would likely benefit from increased supervision (possibly 24/7 supervision) at time of discharge.  Receptive language was functional, and no dysarthria was noted.  Recommend additional skilled ST, supervision, and assistance with IADLs at time of discharge.  Discussed results and recommendations with pt, son, and Charity fundraiser. SLP will f/u acutely.    SLP Assessment  SLP Recommendation/Assessment: Patient needs continued Speech Lanaguage Pathology Services SLP Visit Diagnosis: Aphasia (R47.01);Cognitive communication deficit (R41.841)    Recommendations for follow up therapy are one component of a multi-disciplinary discharge planning process, led by the attending physician.  Recommendations may be updated based on patient status, additional functional criteria and insurance authorization.    Follow Up Recommendations  Skilled Nursing facility    Frequency and Duration min 2x/week  2 weeks      SLP Evaluation Cognition  Overall Cognitive Status: History of cognitive impairments - at baseline Arousal/Alertness: Awake/alert Orientation Level: Oriented to person;Oriented to situation;Disoriented to place Year: 2020 Day of Week: Incorrect Attention: Focused;Sustained Focused Attention: Impaired Focused Attention Impairment: Verbal complex Sustained Attention: Impaired Sustained Attention Impairment: Verbal complex Memory: Impaired Memory Impairment: Decreased short term memory;Decreased recall of new information;Storage deficit;Retrieval deficit Decreased Short Term Memory: Verbal basic Awareness: Impaired Awareness Impairment: Intellectual impairment Problem Solving: Impaired Problem Solving Impairment: Functional complex;Verbal complex Safety/Judgment: Impaired       Comprehension   Auditory Comprehension Overall Auditory Comprehension: Appears within functional limits for tasks assessed Yes/No Questions: Within Functional Limits Commands: Within Functional Limits  Conversation: Complex    Expression Expression Primary Mode of Expression: Verbal Verbal Expression Overall Verbal Expression: Impaired Initiation: No impairment Level of Generative/Spontaneous Verbalization: Conversation Repetition: Impaired Level of Impairment: Phrase level Naming: Impairment Responsive: 76-100% accurate Confrontation: Impaired Convergent: 50-74% accurate Divergent: 25-49% accurate Written Expression Dominant Hand: Right   Oral / Motor  Oral Motor/Sensory Function Overall Oral Motor/Sensory Function: Within functional limits Motor Speech Overall Motor Speech: Appears within functional limits for tasks assessed   GO                   Villa Herb M.S., CCC-SLP Acute Rehabilitation Services Office: [phone]  Shanon Rosser Alecsander Hattabaugh 11/30/2020, 1:41 PM

## 2020-11-30 NOTE — Evaluation (Signed)
Clinical/Bedside Swallow Evaluation Patient Details  Name: Ann Johns MRN: [id number] Date of Birth: 09-Sep-1933  Today's Date: 11/30/2020 Time: SLP Start Time (ACUTE ONLY): 1211 SLP Stop Time (ACUTE ONLY): 1228 SLP Time Calculation (min) (ACUTE ONLY): 17 min  Past Medical History:  Past Medical History:  Diagnosis Date   Cerebrovascular disease 10/03/2020   ICH (intracerebral hemorrhage) (HCC) 11/01/2017   Right frontal   Memory change 10/03/2020   Seizures (HCC) 11/01/2017   Past Surgical History:  Past Surgical History:  Procedure Laterality Date   VAGINAL HYSTERECTOMY     HPI:  87 yo female who presented on 10/13, brought to hospital by her son from her ALF after weakness and multiple falls. 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 PMH ICH right frontal lobe 2019, seizures, dementia, HTN.    Assessment / Plan / Recommendation  Clinical Impression  Pt was seen for a bedside swallow evaluation and she presents with suspected oropharyngeal dysphagia.  Pt was encountered awake/alert and she was noted to have a weak, congested cough at baseline in the absence of PO.  Oral mechanism examination was unremarkable.  Pt consumed trials of thin liquid, nectar-thick liquid, puree, and regular solids.  Pt exhibited immediate and delayed coughing with thin liquid via spoon and straw.  No overt s/sx of aspiration were observed with nectar-thick liquid, puree, or regular solids, but intermittent congested coughing was noted following PO trials.  Cough did not appear to be more congested from baseline and vocal quality remained clear throughout this evaluation.  Mastication of regular solids was mildly prolonged with trace oral residue observed.  Pt was sensate to residue and cleared independently with a nectar-thick liquid wash.  Recommend continuation of Dysphagia 3 (soft) solids and nectar-thick liquids.  SLP will f/u to monitor diet tolerance and upgrade as able.  Pt may benefit  from an instrumental swallow study if s/sx of aspiration continue with thin liquids.  SLP Visit Diagnosis: Dysphagia, oropharyngeal phase (R13.12)    Aspiration Risk  Mild aspiration risk    Diet Recommendation Nectar-thick liquid;Dysphagia 3 (Mech soft)   Liquid Administration via: Cup;Straw Medication Administration: Whole meds with puree Supervision: Patient able to self feed;Intermittent supervision to cue for compensatory strategies Compensations: Slow rate;Small sips/bites Postural Changes: Seated upright at 90 degrees    Other  Recommendations Oral Care Recommendations: Oral care BID Other Recommendations: Remove water pitcher;Prohibited food (jello, ice cream, thin soups)    Recommendations for follow up therapy are one component of a multi-disciplinary discharge planning process, led by the attending physician.  Recommendations may be updated based on patient status, additional functional criteria and insurance authorization.  Follow up Recommendations Skilled Nursing facility      Frequency and Duration min 2x/week  2 weeks       Prognosis Prognosis for Safe Diet Advancement: Fair Barriers to Reach Goals: Cognitive deficits      Swallow Study   General HPI: 87 yo female who presented on 10/13, brought to hospital by her son from her ALF after weakness and multiple falls. 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 PMH ICH right frontal lobe 2019, seizures, dementia, HTN. Type of Study: Bedside Swallow Evaluation Previous Swallow Assessment: None documented Diet Prior to this Study: Nectar-thick liquids;Dysphagia 3 (soft) Temperature Spikes Noted: No Respiratory Status: Room air History of Recent Intubation: No Behavior/Cognition: Alert;Cooperative;Pleasant mood Oral Cavity Assessment: Dry Oral Care Completed by SLP: No Oral Cavity -  Dentition: Adequate natural dentition Vision: Functional for self-feeding Self-Feeding Abilities: Needs set  up Patient Positioning: Upright in bed Baseline Vocal Quality: Normal Volitional Cough: Weak;Congested Volitional Swallow: Able to elicit    Oral/Motor/Sensory Function Overall Oral Motor/Sensory Function: Within functional limits   Ice Chips Ice chips: Not tested   Thin Liquid Thin Liquid: Impaired Presentation: Cup;Straw Pharyngeal  Phase Impairments: Suspected delayed Swallow;Cough - Immediate;Cough - Delayed    Nectar Thick Nectar Thick Liquid: Within functional limits   Honey Thick Honey Thick Liquid: Not tested   Puree Puree: Within functional limits   Solid     Solid: Impaired Presentation: Self Fed Oral Phase Impairments: Impaired mastication     Villa Herb., M.S., CCC-SLP Acute Rehabilitation Services Office: [phone]  Ann Johns 11/30/2020,1:56 PM

## 2020-11-30 NOTE — Evaluation (Signed)
Occupational Therapy Evaluation Patient Details Name: Ann Johns MRN: [id number] DOB: Apr 28, 1933 Today's Date: 11/30/2020   History of Present Illness 87yo female who presented on 10/13, brought to hospital by her son from her ALF after weakness and multiple falls. 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 PMH ICH right frontal lobe 2019, seizures, dementia, HTN   Clinical Impression   Pt admitted for concerns listed above. PTA pt was living in independent living at [facility], where she was getting assistance with bathing, medication management, and supervision to all meals and activities. Pt uses a rollator at baseline. At this time, pt presents with balance deficits, increased weakness and minimal coordination deficits. She requires set up to mod A for all ADL's and functional mobility and will benefit from SNF level therapy prior to returning home to maximize her independence. OT will follow up acutely.      Recommendations for follow up therapy are one component of a multi-disciplinary discharge planning process, led by the attending physician.  Recommendations may be updated based on patient status, additional functional criteria and insurance authorization.   Follow Up Recommendations  SNF    Equipment Recommendations  None recommended by OT    Recommendations for Other Services       Precautions / Restrictions Precautions Precautions: Fall;Other (comment) Precaution Comments: acute PCA territory CVA, hx R frontal ICH Restrictions Weight Bearing Restrictions: No      Mobility Bed Mobility Overal bed mobility: Needs Assistance Bed Mobility: Supine to Sit;Sit to Supine     Supine to sit: Min guard Sit to supine: Min assist   General bed mobility comments: Min Guard for increased time to come to sintting, min A for bringing BLE into the bed to retunr to supine.    Transfers Overall transfer level: Needs assistance Equipment used: Rolling  walker (2 wheeled) Transfers: Sit to/from Stand Sit to Stand: Min assist         General transfer comment: Min A to steady and power up to standing    Balance Overall balance assessment: Needs assistance Sitting-balance support: Feet unsupported;No upper extremity supported Sitting balance-Leahy Scale: Good     Standing balance support: Bilateral upper extremity supported Standing balance-Leahy Scale: Poor Standing balance comment: Multiple small LoB with dynamic standing and ambulating, requiring hands on support                           ADL either performed or assessed with clinical judgement   ADL Overall ADL's : Needs assistance/impaired Eating/Feeding: Set up;Sitting   Grooming: Set up;Sitting   Upper Body Bathing: Min guard;Sitting   Lower Body Bathing: Minimal assistance;Moderate assistance;Sitting/lateral leans;Sit to/from stand   Upper Body Dressing : Set up;Sitting   Lower Body Dressing: Minimal assistance;Sitting/lateral leans;Sit to/from stand   Toilet Transfer: Minimal assistance;Ambulation   Toileting- Clothing Manipulation and Hygiene: Minimal assistance;Moderate assistance;Sitting/lateral lean;Sit to/from stand       Functional mobility during ADLs: Minimal assistance;Rolling walker General ADL Comments: Pt able to take steps forwards and backwards, had multiple small LoB easily corrected with minimal support from OT.     Vision Baseline Vision/History: 1 Wears glasses Ability to See in Adequate Light: 0 Adequate Patient Visual Report: No change from baseline Vision Assessment?: No apparent visual deficits Additional Comments: Tracking, visual fields, and double vision stimulation all [facility]     Perception Perception Perception Tested?: No   Praxis Praxis Praxis tested?: Within functional limits  Pertinent Vitals/Pain Pain Assessment: No/denies pain     Hand Dominance Right   Extremity/Trunk Assessment Upper Extremity  Assessment Upper Extremity Assessment: Overall WFL for tasks assessed   Lower Extremity Assessment Lower Extremity Assessment: Defer to PT evaluation   Cervical / Trunk Assessment Cervical / Trunk Assessment: Kyphotic   Communication Communication Communication: No difficulties   Cognition Arousal/Alertness: Awake/alert Behavior During Therapy: WFL for tasks assessed/performed Overall Cognitive Status: History of cognitive impairments - at baseline                                 General Comments: Pt easily distracted, requiring verbal cues at times to attend to tasks and initiate them. Poor short term memory   General Comments  VSS on RA, Son prestent and supportive    Exercises     Shoulder Instructions      Home Living Family/patient expects to be discharged to:: Assisted living   Available Help at Discharge: Family;Other (Comment) (Staff at [employer]) Type of Home: Independent living facility                       Home Equipment: Dan Humphreys - 4 wheels;Shower seat;Grab bars - toilet;Grab bars - tub/shower   Additional Comments: has been getting more assist from nursing recently- help with getting to dining hall and managing meds, help getting to exercise classes. Usually uses a rollator. Had a recent fall on Wednesday and thursday of this past week.      Prior Functioning/Environment Level of Independence: Needs assistance  Gait / Transfers Assistance Needed: recently, nursing has had to help her mobilize to/from dining hall and exercise classes ADL's / Homemaking Assistance Needed: Has needed assistance with bathing and sometimes LB dressing, as well as all meals and medication mangamnet            OT Problem List: Decreased strength;Decreased activity tolerance;Impaired balance (sitting and/or standing);Decreased coordination;Decreased safety awareness;Decreased knowledge of use of DME or AE      OT Treatment/Interventions: Self-care/ADL  training;Therapeutic exercise;Energy conservation;DME and/or AE instruction;Therapeutic activities;Patient/family education;Balance training    OT Goals(Current goals can be found in the care plan section) Acute Rehab OT Goals Patient Stated Goal: go to rehab OT Goal Formulation: With patient Time For Goal Achievement: 12/14/20 Potential to Achieve Goals: Good ADL Goals Pt Will Perform Grooming: with modified independence;standing Pt Will Perform Lower Body Bathing: with supervision;sitting/lateral leans;sit to/from stand Pt Will Perform Lower Body Dressing: with supervision;sitting/lateral leans;sit to/from stand Pt Will Transfer to Toilet: with supervision;ambulating Additional ADL Goal #1: Pt will maintain dynamic standing balance at the sink using 1 UE for support to complete grooming tasks.  OT Frequency: Min 2X/week   Barriers to D/C:            Co-evaluation              AM-PAC OT "6 Clicks" Daily Activity     Outcome Measure Help from another person eating meals?: A Little Help from another person taking care of personal grooming?: A Little Help from another person toileting, which includes using toliet, bedpan, or urinal?: A Lot Help from another person bathing (including washing, rinsing, drying)?: A Lot Help from another person to put on and taking off regular upper body clothing?: A Little Help from another person to put on and taking off regular lower body clothing?: A Lot 6 Click Score: 15   End of Session  Equipment Utilized During Treatment: Careers adviser Communication: Mobility status  Activity Tolerance: Patient tolerated treatment well Patient left: in bed;with call bell/phone within reach;with family/visitor present;with bed alarm set  OT Visit Diagnosis: Unsteadiness on feet (R26.81);Other abnormalities of gait and mobility (R26.89);Muscle weakness (generalized) (M62.81);Repeated falls (R29.6)                Time: 1635-1700 OT Time  Calculation (min): 25 min Charges:  OT General Charges $OT Visit: 1 Visit OT Evaluation $OT Eval Moderate Complexity: 1 Mod OT Treatments $Self Care/Home Management : 8-22 mins  Haliey Romberg H., OTR/L Acute Rehabilitation  Yliana Gravois Elane Renesha Lizama 11/30/2020, 6:01 PM

## 2020-12-01 DIAGNOSIS — U071 COVID-19: Secondary | ICD-10-CM | POA: Diagnosis not present

## 2020-12-01 DIAGNOSIS — N3000 Acute cystitis without hematuria: Secondary | ICD-10-CM | POA: Diagnosis not present

## 2020-12-01 DIAGNOSIS — I639 Cerebral infarction, unspecified: Secondary | ICD-10-CM | POA: Diagnosis not present

## 2020-12-01 LAB — COMPREHENSIVE METABOLIC PANEL
ALT: 16 U/L (ref 0–44)
AST: 22 U/L (ref 15–41)
Albumin: 3.2 g/dL — ABNORMAL LOW (ref 3.5–5.0)
Alkaline Phosphatase: 62 U/L (ref 38–126)
Anion gap: 10 (ref 5–15)
BUN: 13 mg/dL (ref 8–23)
CO2: 22 mmol/L (ref 22–32)
Calcium: 8.8 mg/dL — ABNORMAL LOW (ref 8.9–10.3)
Chloride: 103 mmol/L (ref 98–111)
Creatinine, Ser: 0.5 mg/dL (ref 0.44–1.00)
GFR, Estimated: >60 mL/min (ref >60)
Glucose, Bld: 166 mg/dL — ABNORMAL HIGH (ref 70–99)
Potassium: 3.7 mmol/L (ref 3.5–5.1)
Sodium: 135 mmol/L (ref 135–145)
Total Bilirubin: 0.7 mg/dL (ref 0.3–1.2)
Total Protein: 6 g/dL — ABNORMAL LOW (ref 6.5–8.1)

## 2020-12-01 LAB — CBC WITH DIFFERENTIAL/PLATELET
Abs Immature Granulocytes: 0.06 10*3/uL (ref 0.00–0.07)
Basophils Absolute: 0 10*3/uL (ref 0.0–0.1)
Basophils Relative: 0 %
Eosinophils Absolute: 0 10*3/uL (ref 0.0–0.5)
Eosinophils Relative: 0 %
HCT: 35.6 % — ABNORMAL LOW (ref 36.0–46.0)
Hemoglobin: 12.3 g/dL (ref 12.0–15.0)
Immature Granulocytes: 1 %
Lymphocytes Relative: 15 %
Lymphs Abs: 1.6 10*3/uL (ref 0.7–4.0)
MCH: 30.4 pg (ref 26.0–34.0)
MCHC: 34.6 g/dL (ref 30.0–36.0)
MCV: 88.1 fL (ref 80.0–100.0)
Monocytes Absolute: 0.6 10*3/uL (ref 0.1–1.0)
Monocytes Relative: 5 %
Neutro Abs: 8.2 10*3/uL — ABNORMAL HIGH (ref 1.7–7.7)
Neutrophils Relative %: 79 %
Platelets: 320 10*3/uL (ref 150–400)
RBC: 4.04 MIL/uL (ref 3.87–5.11)
RDW: 12.6 % (ref 11.5–15.5)
WBC: 10.4 10*3/uL (ref 4.0–10.5)
nRBC: 0 % (ref 0.0–0.2)

## 2020-12-01 LAB — C-REACTIVE PROTEIN: CRP: 5.2 mg/dL — ABNORMAL HIGH (ref <1.0)

## 2020-12-01 LAB — D-DIMER, QUANTITATIVE: D-Dimer, Quant: 0.54 ug/mL-FEU — ABNORMAL HIGH (ref 0.00–0.50)

## 2020-12-01 LAB — BRAIN NATRIURETIC PEPTIDE: B Natriuretic Peptide: 88.1 pg/mL (ref 0.0–100.0)

## 2020-12-01 LAB — MAGNESIUM: Magnesium: 1.9 mg/dL (ref 1.7–2.4)

## 2020-12-01 MED ORDER — POTASSIUM CHLORIDE CRYS ER 20 MEQ PO TBCR
20.0000 meq | EXTENDED_RELEASE_TABLET | Freq: Once | ORAL | Status: AC
  Administered 2020-12-01: 20 meq via ORAL
  Filled 2020-12-01: qty 1

## 2020-12-01 MED ORDER — FUROSEMIDE 40 MG PO TABS
40.0000 mg | ORAL_TABLET | Freq: Once | ORAL | Status: AC
  Administered 2020-12-01: 40 mg via ORAL
  Filled 2020-12-01: qty 1

## 2020-12-01 NOTE — Progress Notes (Signed)
PROGRESS NOTE                                                                                                                                                                                                             Patient Demographics:    Ann Johns, is a 87 y.o. female, DOB - 10-14-1933, ZOX:[id number]  Outpatient Primary MD for the patient is Lewis Moccasin, MD    LOS - 3  Admit date - 11/28/2020    Chief Complaint  Patient presents with   Fall   Altered Mental Status       Brief Narrative (HPI from H&P)   Ann Johns  is a 87 y.o. female, with history of intracerebral hemorrhage in 2019, seizures, early dementia, essential hypertension who lives at an assisted living facility was brought in by her son to the hospital for evaluation.  According to the son she was in today morning around 9 AM when she had a fall at assisted living facility, she subsequently did well however she was feeling weak the whole day, this morning she had another 4 at assisted living facility after which she was brought to the [facility] ER where she was found to have an acute stroke, UTI and a COVID-19 infection.  Neurology was consulted and I was requested to admit the patient to admit the patient   Subjective:   Patient in bed, appears comfortable, denies any headache, no fever, no chest pain or pressure, no shortness of breath , no abdominal pain. No new focal weakness.   Assessment  & Plan :     1. Left PCA - CVA in the Subcortical left parietooccipital lobes and callosal splenium - causing x 2 falls, starting over 36 hrs before admission - she thankfully has mild deficits, Neuro following, ASA - Statin for now, Plavix x 3 weeks, PT - OT - SLP, may need SNF. DC likely 12/02/20.   2. UTI - Rocephin x 3, negative cultures.   3.  COVID infection.  She has had 2 doses of vaccine so far, she does have a dry cough, will check inflammatory  markers for now Decadron IV and remdesivir and monitor closely.  Son has consented for for treatment including Actemra if needed.   4.  Hypertension.  Allow for permissive hypertension due to #1 above.   5.  Dementia.  At risk for delirium.  Continue Namenda at home dose, minimize benzodiazepines and narcotics, son warned about possible worsening delirium while in the hospital.   SpO2: 96 %  Recent Labs  Lab 11/28/20 1014 11/28/20 1051 11/28/20 1701 11/29/20 0219 11/30/20 0453 12/01/20 0359  WBC  --  10.0 8.0 7.4  --  10.4  HGB  --  11.9* 12.1 12.7  --  12.3  HCT  --  35.4* 38.8 39.1  --  35.6*  PLT  --  250 259 269  --  320  CRP  --   --  11.5* 13.5*  --  5.2*  BNP  --   --   --  60.9 268.0* 88.1  DDIMER  --   --  1.45* 1.15* 0.71* 0.54*  AST  --   --   --  25  --  22  ALT  --   --   --  15  --  16  ALKPHOS  --   --   --  75  --  62  BILITOT  --   --   --  1.3*  --  0.7  ALBUMIN  --   --   --  3.6  --  3.2*  SARSCOV2NAA POSITIVE*  --   --   --   --   --            Condition - Extremely Guarded  Family Communication  :  son at the time of admission, [phone] message left 11/29/20 at 11.44 pm, 11/30/20 .12 am - answering machine - message left  Baxter Kail [phone] 11/30/20, 12/01/20   Code Status :  Full  Consults  :  Neuro  PUD Prophylaxis :    Procedures  :     MRI - Moderate-sized acute/early subacute left PCA territory infarct affecting the cortical/subcortical left parietooccipital lobes and callosal splenium. Redemonstrated chronic encephalomalacia and associated chronic blood products in the anterior frontal lobes, and anterior right temporal lobe, presumably posttraumatic in etiology. Chronic small-vessel ischemic changes which are severe in the cerebral white matter, and mild in the pons. Mild-to-moderate generalized cerebral atrophy. Comparatively mild cerebellar atrophy. Paranasal sinus disease  MRA - 1. No emergent large vessel occlusion or  high-grade stenosis. 2. Mild narrowing of the right PCA P2 segment.    EEG - non acute  Carotid US - stable.  TTE - 1. Left ventricular ejection fraction, by estimation, is 70 to 75%. The left ventricle has hyperdynamic function. The left ventricle has no regional wall motion abnormalities. There is moderate asymmetric left ventricular hypertrophy of the basal-septal  segment. Indeterminate diastolic filling due to E-A fusion.  2. Right ventricular systolic function is normal. The right ventricular size is normal.  3. The mitral valve is grossly normal. Mild mitral valve regurgitation. No evidence of mitral stenosis.  4. The aortic valve was not well visualized. There is mild calcification of the aortic valve. Aortic valve regurgitation is mild to moderate. No aortic stenosis is present      Disposition Plan  :    Status is: Inpatient - Remains inpatient appropriate because: CVA , Covid, placement  DVT Prophylaxis  :    heparin injection 5,000 Units Start: 11/28/20 1715    Lab Results  Component Value Date   PLT 320 12/01/2020    Diet :  Diet Order             DIET SOFT Room service appropriate? Yes with Assist;  Fluid consistency: Nectar Thick  Diet effective now                    Inpatient Medications  Scheduled Meds:  aspirin  81 mg Oral Daily   atorvastatin  40 mg Oral Daily   cholecalciferol  75 mcg Oral Daily   clopidogrel  75 mg Oral Daily   dextromethorphan-guaiFENesin  1 tablet Oral BID   heparin  5,000 Units Subcutaneous Q8H   mouth rinse  15 mL Mouth Rinse BID   memantine  10 mg Oral BID   multivitamin with minerals  1 tablet Oral Daily   Continuous Infusions:   PRN Meds:.acetaminophen **OR** acetaminophen, bisacodyl, haloperidol lactate, ondansetron **OR** ondansetron (ZOFRAN) IV  Antibiotics  :    Anti-infectives (From admission, onward)    Start     Dose/Rate Route Frequency Ordered Stop   11/29/20 1000  remdesivir 100 mg in sodium chloride  0.9 % 100 mL IVPB  Status:  Discontinued       See Hyperspace for full Linked Orders Report.   100 mg 200 mL/hr over 30 Minutes Intravenous Daily 11/28/20 1743 11/30/20 1242   11/28/20 1900  remdesivir 200 mg in sodium chloride 0.9% 250 mL IVPB       See Hyperspace for full Linked Orders Report.   200 mg 580 mL/hr over 30 Minutes Intravenous Once 11/28/20 1743 11/28/20 2232   11/28/20 1715  cefTRIAXone (ROCEPHIN) 1 g in sodium chloride 0.9 % 100 mL IVPB        1 g 200 mL/hr over 30 Minutes Intravenous Every 24 hours 11/28/20 1700 11/30/20 1625   11/28/20 1330  cephALEXin (KEFLEX) capsule 500 mg  Status:  Discontinued        500 mg Oral Every 8 hours 11/28/20 1328 11/28/20 1700        Time Spent in minutes  30   Susa Raring M.D on 12/01/2020 at 9:33 AM  To page go to [email or web address]   [facility] -  Office  [phone]  See all Orders from today for further details    Objective:   Vitals:   11/30/20 2033 12/01/20 0005 12/01/20 0421 12/01/20 0838  BP: 126/83 105/64 128/81 (!) 145/94  Pulse: (!) 110 95 98 96  Resp: 16 16 15 18   Temp: (!) 97.4 F (36.3 C) 97.8 F (36.6 C) 97.8 F (36.6 C) 97.6 F (36.4 C)  TempSrc: Oral Oral Oral Axillary  SpO2: 95% 94% 95% 96%  Weight:      Height:        Wt Readings from Last 3 Encounters:  11/29/20 51.2 kg  10/03/20 51.7 kg  04/02/20 54 kg     Intake/Output Summary (Last 24 hours) at 12/01/2020 0933 Last data filed at 11/30/2020 1659 Gross per 24 hour  Intake 814 ml  Output --  Net 814 ml     Physical Exam  Awake Alert x1, No new F.N deficits,    [place].AT,PERRAL Supple Neck, No JVD,   Symmetrical Chest wall movement, Good air movement bilaterally, CTAB RRR,No Gallops, Rubs or new Murmurs,  +ve B.Sounds, Abd Soft, No tenderness,   No Cyanosis, Clubbing or edema,        Data Review:    CBC Recent Labs  Lab 11/28/20 1051 11/28/20 1701 11/29/20 0219 12/01/20 0359  WBC 10.0 8.0 7.4 10.4  HGB 11.9*  12.1 12.7 12.3  HCT 35.4* 38.8 39.1 35.6*  PLT 250 259 269 320  MCV 91.5 99.0 93.3  88.1  MCH 30.7 30.9 30.3 30.4  MCHC 33.6 31.2 32.5 34.6  RDW 12.4 12.3 12.4 12.6  LYMPHSABS 1.3  --  1.0 1.6  MONOABS 1.1*  --  0.2 0.6  EOSABS 0.0  --  0.0 0.0  BASOSABS 0.0  --  0.0 0.0    Recent Labs  Lab 11/28/20 1051 11/28/20 1701 11/29/20 0219 11/30/20 0453 12/01/20 0359  NA 131*  --  135  --  135  K 3.7  --  3.9  --  3.7  CL 98  --  103  --  103  CO2 22  --  17*  --  22  GLUCOSE 116*  --  184*  --  166*  BUN 9  --  7*  --  13  CREATININE 0.45 0.46 0.62  --  0.50  CALCIUM 8.8*  --  9.0  --  8.8*  AST  --   --  25  --  22  ALT  --   --  15  --  16  ALKPHOS  --   --  75  --  62  BILITOT  --   --  1.3*  --  0.7  ALBUMIN  --   --  3.6  --  3.2*  MG  --   --  1.8  --  1.9  CRP  --  11.5* 13.5*  --  5.2*  DDIMER  --  1.45* 1.15* 0.71* 0.54*  HGBA1C  --   --  5.7*  --   --   BNP  --   --  60.9 268.0* 88.1    ------------------------------------------------------------------------------------------------------------------ Recent Labs    11/29/20 0219  CHOL 196  HDL 48  LDLCALC 133*  TRIG 75  CHOLHDL 4.1    Lab Results  Component Value Date   HGBA1C 5.7 (H) 11/29/2020   ------------------------------------------------------------------------------------------------------------------ No results for input(s): TSH, T4TOTAL, T3FREE, THYROIDAB in the last 72 hours.  Invalid input(s): FREET3  Cardiac Enzymes No results for input(s): CKMB, TROPONINI, MYOGLOBIN in the last 168 hours.  Invalid input(s): CK ------------------------------------------------------------------------------------------------------------------    Component Value Date/Time   BNP 88.1 12/01/2020 0359     Radiology Reports DG Chest 1 View  Result Date: 11/28/2020 CLINICAL DATA:  Stroke.  Pre MRI metal screening EXAM: CHEST  1 VIEW; PELVIS - 1-2 VIEW; ABDOMEN - 1 VIEW COMPARISON:  None. FINDINGS: The  heart and mediastinal contours are within normal limits. Aortic calcification. 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 No focal consolidation. No pulmonary edema. No pleural effusion. No pneumothorax. Nonobstructive bowel gas pattern. No acute osseous abnormality. Multilevel degenerative changes of the thoracolumbar spine. No retained radiopaque foreign body. IMPRESSION: 1. No retained radiopaque foreign body. 2. No acute cardiopulmonary disease. 3. Nonobstructive bowel gas pattern. 4.  Aortic Atherosclerosis (ICD10-I70.0). Electronically Signed   By: Tish Frederickson M.D.   On: 11/28/2020 15:19   DG Pelvis 1-2 Views  Result Date: 11/28/2020 CLINICAL DATA:  Stroke.  Pre MRI metal screening EXAM: CHEST  1 VIEW; PELVIS - 1-2 VIEW; ABDOMEN - 1 VIEW COMPARISON:  None. FINDINGS: The heart and mediastinal contours are within normal limits. Aortic calcification. 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 No focal consolidation. No pulmonary edema. No pleural effusion. No pneumothorax. Nonobstructive bowel gas pattern. No acute osseous abnormality. Multilevel degenerative changes of the thoracolumbar spine. No retained radiopaque foreign body. IMPRESSION: 1. No retained radiopaque foreign body. 2. No acute cardiopulmonary disease. 3. Nonobstructive bowel gas pattern. 4.  Aortic  Atherosclerosis (ICD10-I70.0). Electronically Signed   By: Tish Frederickson M.D.   On: 11/28/2020 15:19   DG Abd 1 View  Result Date: 11/28/2020 CLINICAL DATA:  Stroke.  Pre MRI metal screening EXAM: CHEST  1 VIEW; PELVIS - 1-2 VIEW; ABDOMEN - 1 VIEW COMPARISON:  None. FINDINGS: The heart and mediastinal contours are within normal limits. Aortic calcification. 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 No focal consolidation. No pulmonary edema. No pleural effusion. No pneumothorax. Nonobstructive bowel gas pattern. No acute osseous abnormality. Multilevel degenerative changes of the thoracolumbar spine. No retained radiopaque  foreign body. IMPRESSION: 1. No retained radiopaque foreign body. 2. No acute cardiopulmonary disease. 3. Nonobstructive bowel gas pattern. 4.  Aortic Atherosclerosis (ICD10-I70.0). Electronically Signed   By: Tish Frederickson M.D.   On: 11/28/2020 15:19   CT HEAD WO CONTRAST ( )  Result Date: 11/28/2020 CLINICAL DATA:  Head trauma, minor (Age >= 65y) hx of dementia with repeated falls recently, hematoma near right eye, evaluate for ICH EXAM: CT HEAD WITHOUT CONTRAST TECHNIQUE: Contiguous axial images were obtained from the base of the skull through the vertex without intravenous contrast. COMPARISON:  MRI 04/16/2020, CT 12/24/2018 FINDINGS: Brain: New area of low attenuation in the anteroinferior left frontal lobe with loss of gray-white differentiation and developing encephalomalacia is new from prior and likely represents a late subacute or chronic left ACA territory infarction. Chronic right anterior frontal lobe infarct is unchanged. No intracranial hemorrhage. No extra-axial collection. Stable size and configuration of the ventricles. No mass lesion or mass effect. Extensive low-density changes within the periventricular and subcortical white matter compatible with chronic microvascular ischemic change. Mild diffuse cerebral volume loss. Vascular: Atherosclerotic calcifications involving the large vessels of the skull base. No unexpected hyperdense vessel. Skull: Normal. Negative for fracture or focal lesion. Sinuses/Orbits: Mucosal thickening and partial opacification of the right maxillary sinus. There is extensive mucosal thickening within the bilateral ethmoid air cells. Orbital structures within normal limits. Other: Right periorbital soft tissue swelling. IMPRESSION: 1. Low-attenuation changes in the left frontal lobe is new from prior and likely represents a late subacute to chronic left ACA territory infarction. Further evaluation with MRI of the brain is recommended. 2. No intracranial  hemorrhage identified. 3. Chronic right anterior frontal lobe infarct is unchanged. 4. Right periorbital soft tissue swelling. 5. Paranasal sinus disease as described. Electronically Signed   By: Duanne Guess D.O.   On: 11/28/2020 12:58   MR ANGIO HEAD WO CONTRAST  Result Date: 11/28/2020 CLINICAL DATA:  Acute neurologic deficit EXAM: MRA HEAD WITHOUT CONTRAST TECHNIQUE: Angiographic images of the Circle of Willis were acquired using MRA technique without intravenous contrast. COMPARISON:  No pertinent prior exam. FINDINGS: POSTERIOR CIRCULATION: --Vertebral arteries: Normal --Inferior cerebellar arteries: Normal. --Basilar artery: Normal. --Superior cerebellar arteries: Normal. --Posterior cerebral arteries: Mild narrowing of the right PCA P2 segment. Normal left. ANTERIOR CIRCULATION: --Intracranial internal carotid arteries: Normal. --Anterior cerebral arteries (ACA): Normal. --Middle cerebral arteries (MCA): Normal. ANATOMIC VARIANTS: None IMPRESSION: 1. No emergent large vessel occlusion or high-grade stenosis. 2. Mild narrowing of the right PCA P2 segment. Electronically Signed   By: Deatra Robinson M.D.   On: 11/28/2020 23:20   MR BRAIN WO CONTRAST  Result Date: 11/28/2020 CLINICAL DATA:  Stroke, follow-up; CT head with question of evolving or new subacute ACA infarct, patient with history of dementia and prior CVA, follow-up imaging. EXAM: MRI HEAD WITHOUT CONTRAST TECHNIQUE: Multiplanar, multiecho pulse sequences of the brain and surrounding structures were obtained without intravenous contrast.  COMPARISON:  Prior head CT 11/28/2020.  Brain MRI 04/16/2020. FINDINGS: Brain: Mild to moderate generalized cerebral atrophy. Comparatively mild cerebellar atrophy. Moderate-sized acute/early subacute infarct within the cortical/subcortical left parietooccipital lobes and within the callosal splenium (PCA vascular territory). Redemonstrated chronic foci of encephalomalacia within the anterior frontal  lobes and anterior right temporal lobe, with associated chronic blood products. Background advanced patchy and confluent T2 FLAIR hyperintense signal abnormality within the cerebral white matter, nonspecific but compatible with chronic small vessel ischemic disease. Mild chronic small vessel ischemic changes are also present within the pons. No evidence of an intracranial mass. No extra-axial fluid collection. No midline shift. Partially empty sella turcica. Vascular: No definite loss of expected proximal arterial flow voids. Skull and upper cervical spine: No focal suspicious marrow lesion. Incompletely assessed cervical spondylosis. Sinuses/Orbits: Visualized orbits show no acute finding. Mild-to-moderate mucosal thickening within the bilateral ethmoid and right maxillary sinuses. Superimposed small volume frothy secretions within the right maxillary sinus. Mild mucosal thickening also present within the bilateral sphenoid and left maxillary sinuses. Other: 13 mm left parietal scalp lesion, nonspecific but possibly reflecting a sebaceous cyst. IMPRESSION: Moderate-sized acute/early subacute left PCA territory infarct affecting the cortical/subcortical left parietooccipital lobes and callosal splenium. Redemonstrated chronic encephalomalacia and associated chronic blood products in the anterior frontal lobes, and anterior right temporal lobe, presumably posttraumatic in etiology. Chronic small-vessel ischemic changes which are severe in the cerebral white matter, and mild in the pons. Mild-to-moderate generalized cerebral atrophy. Comparatively mild cerebellar atrophy. Paranasal sinus disease, as described. Electronically Signed   By: Jackey Loge D.O.   On: 11/28/2020 16:02   DG Chest Port 1 View  Result Date: 11/29/2020 CLINICAL DATA:  Shortness of breath.  Altered mental status. EXAM: PORTABLE CHEST 1 VIEW COMPARISON:  11/28/2020 FINDINGS: The patient is rotated to the right with grossly unchanged  cardiomediastinal silhouette. Eventration of the right hemidiaphragm is again noted. No confluent airspace opacity, edema, sizable pleural effusion, or pneumothorax is identified. Thoracic levoscoliosis is noted. IMPRESSION: No active disease. Electronically Signed   By: Sebastian Ache M.D.   On: 11/29/2020 05:31   EEG adult  Result Date: 11/29/2020 Charlsie Quest, MD     11/29/2020 12:33 PM Patient Name: Beckie Viscardi MRN: [id number] Epilepsy Attending: Charlsie Quest Referring Physician/Provider: Janey Genta, NP Date: 11/29/2020 Duration: 25.17 mins Patient history: 87yo F with ams. EEG to evaluate for seizure Level of alertness: Awake, asleep AEDs during EEG study: None Technical aspects: This EEG study was done with scalp electrodes positioned according to the 10-20 International system of electrode placement. Electrical activity was acquired at a sampling rate of  and reviewed with a high frequency filter of  and a low frequency filter of . EEG data were recorded continuously and digitally stored. Description: The posterior dominant rhythm consists of 7.5 Hz activity of moderate voltage (25-35 uV) seen predominantly in posterior head regions, symmetric and reactive to eye opening and eye closing. Sleep was characterized by vertex waves, sleep spindles (12 to 14 Hz), maximal frontocentral region. EEG showed intermittent generalized 3 to 6 Hz theta-delta slowing. Hyperventilation and photic stimulation were not performed.   ABNORMALITY - Intermittent slow, generalized IMPRESSION: This study is suggestive of mild diffuse encephalopathy, nonspecific etiology. No seizures or epileptiform discharges were seen throughout the recording. Charlsie Quest   ECHOCARDIOGRAM COMPLETE  Result Date: 11/29/2020    ECHOCARDIOGRAM REPORT   Patient Name:   SACHEEN ARRASMITH Date of Exam: 11/29/2020 Medical Rec #:  [id number]  Height:       62.0 in Accession #:    [id number]    Weight:       114.0  lb Date of Birth:  [birth year]     BSA:          1.505 m Patient Age:    87 years      BP:           157/82 mmHg Patient Gender: F             HR:           98 bpm. Exam Location:  Inpatient Procedure: 2D Echo, Cardiac Doppler, Color Doppler and Intracardiac            Opacification Agent Indications:    Stroke I63.9  History:        Patient has prior history of Echocardiogram examinations, most                 recent 11/12/2017.  Sonographer:    Leta Jungling RDCS Referring Phys: Heide Scales [facility]  Sonographer Comments: Technically difficult study due to poor echo windows. IMPRESSIONS  1. Left ventricular ejection fraction, by estimation, is 70 to 75%. The left ventricle has hyperdynamic function. The left ventricle has no regional wall motion abnormalities. There is moderate asymmetric left ventricular hypertrophy of the basal-septal  segment. Indeterminate diastolic filling due to E-A fusion.  2. Right ventricular systolic function is normal. The right ventricular size is normal.  3. The mitral valve is grossly normal. Mild mitral valve regurgitation. No evidence of mitral stenosis.  4. The aortic valve was not well visualized. There is mild calcification of the aortic valve. Aortic valve regurgitation is mild to moderate. No aortic stenosis is present. Conclusion(s)/Recommendation(s): No intracardiac source of embolism detected on this transthoracic study. A transesophageal echocardiogram is recommended to exclude cardiac source of embolism if clinically indicated. FINDINGS  Left Ventricle: Left ventricular ejection fraction, by estimation, is 70 to 75%. The left ventricle has hyperdynamic function. The left ventricle has no regional wall motion abnormalities. Definity contrast agent was given IV to delineate the left ventricular endocardial borders. The left ventricular internal cavity size was normal in size. There is moderate asymmetric left ventricular hypertrophy of the basal-septal segment.  Indeterminate diastolic filling due to E-A fusion. Right Ventricle: The right ventricular size is normal. No increase in right ventricular wall thickness. Right ventricular systolic function is normal. Left Atrium: Left atrial size was normal in size. Right Atrium: Right atrial size was normal in size. Pericardium: There is no evidence of pericardial effusion. Presence of pericardial fat pad. Mitral Valve: The mitral valve is grossly normal. Mild mitral valve regurgitation. No evidence of mitral valve stenosis. Tricuspid Valve: The tricuspid valve is normal in structure. Tricuspid valve regurgitation is mild . No evidence of tricuspid stenosis. Aortic Valve: The aortic valve was not well visualized. There is mild calcification of the aortic valve. Aortic valve regurgitation is mild to moderate. No aortic stenosis is present. Pulmonic Valve: The pulmonic valve was not well visualized. Pulmonic valve regurgitation is mild to moderate. No evidence of pulmonic stenosis. Aorta: The aortic root is normal in size and structure. Venous: The inferior vena cava was not well visualized. IAS/Shunts: The interatrial septum was not well visualized.  LEFT VENTRICLE PLAX 2D LVIDd:         3.40 cm   Diastology LVIDs:         2.90 cm   LV e' medial:  5.00 cm/s LV PW:         0.90 cm   LV E/e' medial:  12.0 LV IVS:        1.30 cm   LV e' lateral:   5.66 cm/s LVOT diam:     2.10 cm   LV E/e' lateral: 10.6 LV SV:         38 LV SV Index:   25 LVOT Area:     3.46 cm  RIGHT VENTRICLE RV S prime:     16.80 cm/s LEFT ATRIUM           Index LA diam:      2.70 cm 1.79 cm/m LA Vol (A4C): 14.5 ml 9.63 ml/m  AORTIC VALVE LVOT Vmax:   72.70 cm/s LVOT Vmean:  49.000 cm/s LVOT VTI:    0.109 m  AORTA Ao Root diam: 3.30 cm Ao Asc diam:  3.20 cm MITRAL VALVE                TRICUSPID VALVE MV Area (PHT): 5.46 cm     TR Peak grad:   23.2 mmHg MV Decel Time: 139 msec     TR Vmax:        241.00 cm/s MV E velocity: 59.75 cm/s MV A velocity: 104.00  cm/s  SHUNTS MV E/A ratio:  0.57         Systemic VTI:  0.11 m                             Systemic Diam: 2.10 cm Weston Brass MD Electronically signed by Weston Brass MD Signature Date/Time: 11/29/2020/6:57:24 PM    Final    VAS US CAROTID (at [facility] and [facility] only)  Result Date: 11/29/2020 Carotid Arterial Duplex Study Patient Name:  SIVANA KOLENOVIC  Date of Exam:   11/29/2020 Medical Rec #: [id number]      Accession #:    [id number] Date of Birth: 07-Mar-1933      Patient Gender: F Patient Age:   77 years Exam Location:  [facility] Procedure:      VAS US CAROTID Referring Phys: Terrilee Files [facility] --------------------------------------------------------------------------------  Indications:       CVA and recent falls, weakness, COVID+. Risk Factors:      Hypertension. Other Factors:     HX of ICH (2019). Comparison Study:  Previous exam 12/07/2017 1-39% BIL Performing Technologist: Ernestene Mention RVT, RDMS  Examination Guidelines: A complete evaluation includes B-mode imaging, spectral Doppler, color Doppler, and power Doppler as needed of all accessible portions of each vessel. Bilateral testing is considered an integral part of a complete examination. Limited examinations for reoccurring indications may be performed as noted.  Right Carotid Findings: +----------+--------+--------+--------+------------------+------------------+           PSV cm/sEDV cm/sStenosisPlaque DescriptionComments           +----------+--------+--------+--------+------------------+------------------+ CCA Prox  61      9                                 intimal thickening +----------+--------+--------+--------+------------------+------------------+ CCA Distal62      14                                intimal thickening +----------+--------+--------+--------+------------------+------------------+ ICA Prox  35      10  calcific and focaltortuous            +----------+--------+--------+--------+------------------+------------------+ ICA Distal74      17                                tortuous           +----------+--------+--------+--------+------------------+------------------+ ECA       65      0               calcific                             +----------+--------+--------+--------+------------------+------------------+ +----------+--------+-------+----------------+-------------------+           PSV cm/sEDV cmsDescribe        Arm Pressure (mmHG) +----------+--------+-------+----------------+-------------------+ [id number]             Multiphasic, WNL                    +----------+--------+-------+----------------+-------------------+ +---------+--------+--+--------+--+---------+ VertebralPSV cm/s58EDV cm/s11Antegrade +---------+--------+--+--------+--+---------+  Left Carotid Findings: +----------+--------+--------+--------+------------------+---------------------+           PSV cm/sEDV cm/sStenosisPlaque DescriptionComments              +----------+--------+--------+--------+------------------+---------------------+ CCA Prox  53      11                                                      +----------+--------+--------+--------+------------------+---------------------+ CCA Distal49      14                                intimal thickening    +----------+--------+--------+--------+------------------+---------------------+ ICA Prox  41      14              calcific          intimal thickening &                                                      tortuous              +----------+--------+--------+--------+------------------+---------------------+ ICA Distal101     27                                                      +----------+--------+--------+--------+------------------+---------------------+ ECA       40      0               calcific                                 +----------+--------+--------+--------+------------------+---------------------+ +----------+--------+--------+----------------+-------------------+           PSV cm/sEDV cm/sDescribe        Arm Pressure (mmHG) +----------+--------+--------+----------------+-------------------+ Subclavian102             Multiphasic, WNL                    +----------+--------+--------+----------------+-------------------+ +---------+--------+--+--------+--+---------+  VertebralPSV cm/s52EDV cm/s11Antegrade +---------+--------+--+--------+--+---------+   Summary: Right Carotid: Velocities in the right ICA are consistent with a 1-39% stenosis.                The extracranial vessels were near-normal with only minimal wall                thickening or plaque. Left Carotid: Velocities in the left ICA are consistent with a 1-39% stenosis.               The extracranial vessels were near-normal with only minimal wall               thickening or plaque. Vertebrals:  Bilateral vertebral arteries demonstrate antegrade flow. Subclavians: Normal flow hemodynamics were seen in bilateral subclavian              arteries. *See table(s) above for measurements and observations.  Electronically signed by Sherald Hess MD on 11/29/2020 at 4:10:26 PM.    Final

## 2020-12-02 LAB — GLUCOSE, CAPILLARY: Glucose-Capillary: 135 mg/dL — ABNORMAL HIGH (ref 70–99)

## 2020-12-02 NOTE — Progress Notes (Signed)
Speech Language Pathology Treatment: Dysphagia  Patient Details Name: Ann Johns MRN: [id number] DOB: 1933-12-11 Today's Date: 12/02/2020 Time: 2423-5361 SLP Time Calculation (min) (ACUTE ONLY): 19 min  Assessment / Plan / Recommendation Clinical Impression  Pt, seen for follow-up dysphagia therapy, alert and upright in bed. No baseline cough noted prior to POs this date though RN reports continued coughing during and outside PO intake. She specifically reports pt "choking" on mechanical soft solid this am. This date, pt consumed thin liquids via cup and straw sips, exhibiting x2 overt coughing across 6 trials. NTL via cup/straw eliminated this presentation. All solids, including mechanical soft chocolate chip cookie, consumed without overt s/sx of aspiration. Given clinical presentation this date raising concern for oropharyngeal dysphagia, recommend instrumental assessment to further evaluate swallow physiology and provide differential dx. RN notified. Current diet recommendations to remain pending completion of testing.    HPI HPI: 87 yo female who presented on 10/13, brought to hospital by her son from her ALF after weakness and multiple falls. 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 PMH ICH right frontal lobe 2019, seizures, dementia, HTN.      SLP Plan  Continue with current plan of care;MBS      Recommendations for follow up therapy are one component of a multi-disciplinary discharge planning process, led by the attending physician.  Recommendations may be updated based on patient status, additional functional criteria and insurance authorization.    Recommendations  Diet recommendations: Dysphagia 3 (mechanical soft);Nectar-thick liquid Liquids provided via: Cup;Straw Medication Administration: Whole meds with puree Supervision: Staff to assist with self feeding;Full supervision/cueing for compensatory strategies Compensations: Minimize environmental  distractions;Slow rate;Small sips/bites                Oral Care Recommendations: Oral care BID;Staff/trained caregiver to provide oral care Follow up Recommendations: Skilled Nursing facility SLP Visit Diagnosis: Dysphagia, unspecified (R13.10) Plan: Continue with current plan of care;MBS       GO               [name], MA, CCC-SLP Acute Rehabilitation Services Office Number: [phone]  Ann Johns  12/02/2020, 3:48 PM

## 2020-12-02 NOTE — TOC Initial Note (Signed)
Transition of Care [facility]) - Initial/Assessment Note    Patient Details  Name: Ann Johns MRN: [id number] Date of Birth: Mar 01, 1933  Transition of Care [facility]) CM/SW Contact:    Baldemar Lenis, LCSW Phone Number: 12/02/2020, 3:18 PM  Clinical Narrative:         CSW spoke with patient's son, Luisa Hart, and then other son and daughter in law Jonny Ruiz and [place]) on the phone to discuss discharge plan. Patient is from independent living at [facility], and family is in agreement that she is not at her baseline and unable to return at this time, agreeable to SNF placement. CSW discussed barrier to discharge of being covid+ and there are no SNF that is taking covid patients at this time. Lengthy discussion regarding expectations with discharging to SNF, CSW answered questions. Family appreciative of information. CSW has faxed out referral and will follow up with bed offers.   Patient covid+ on 10/13, needs airborne isolation until 10/23.            Expected Discharge Plan: Skilled Nursing Facility Barriers to Discharge: Continued Medical Work up, SNF Covid   Patient Goals and CMS Choice Patient states their goals for this hospitalization and ongoing recovery are:: patient unable to participate in goal setting, only oriented to self CMS Medicare.gov Compare Post Acute Care list provided to:: Patient Represenative (must comment) Choice offered to / list presented to : Adult Children  Expected Discharge Plan and Services Expected Discharge Plan: Skilled Nursing Facility     Post Acute Care Choice: Skilled Nursing Facility Living arrangements for the past 2 months: Independent Living Facility Expected Discharge Date: 12/02/20                                    Prior Living Arrangements/Services Living arrangements for the past 2 months: Independent Living Facility Lives with:: Self Patient language and need for interpreter reviewed:: No Do you feel safe going back to the place  where you live?: Yes      Need for Family Participation in Patient Care: Yes (Comment) Care giver support system in place?: No (comment)   Criminal Activity/Legal Involvement Pertinent to Current Situation/Hospitalization: No - Comment as needed  Activities of Daily Living Home Assistive Devices/Equipment: Dan Humphreys (specify type) ADL Screening (condition at time of admission) Patient's cognitive ability adequate to safely complete daily activities?: Yes Is the patient deaf or have difficulty hearing?: No Does the patient have difficulty seeing, even when wearing glasses/contacts?: Yes Does the patient have difficulty concentrating, remembering, or making decisions?: Yes Patient able to express need for assistance with ADLs?: Yes Does the patient have difficulty dressing or bathing?: No Independently performs ADLs?: No Does the patient have difficulty walking or climbing stairs?: Yes Weakness of Legs: Both Weakness of Arms/Hands: Both  Permission Sought/Granted Permission sought to share information with : Facility Medical sales representative, Family Supports Permission granted to share information with : Yes, Verbal Permission Granted  Share Information with NAME: Hilbert Corrigan  Permission granted to share info w AGENCY: SNF  Permission granted to share info w Relationship: Sons, DIL     Emotional Assessment   Attitude/Demeanor/Rapport: Unable to Assess Affect (typically observed): Unable to Assess Orientation: : Oriented to Self Alcohol / Substance Use: Not Applicable Psych Involvement: No (comment)  Admission diagnosis:  Lower urinary tract infectious disease [N39.0] Confusion [R41.0] SOB (shortness of breath) [R06.02] CVA (cerebral vascular accident) (HCC) [I63.9] Acute UTI [  N39.0] Encounter for imaging to screen for metal prior to MRI [Z13.89] Acute cystitis without hematuria [N30.00] Frequent falls [R29.6] Acute CVA (cerebrovascular accident) (HCC)  [I63.9] COVID-19 virus infection [U07.1] COVID-19 [U07.1] Patient Active Problem List   Diagnosis Date Noted   CVA (cerebral vascular accident) (HCC) 11/28/2020   Memory change 10/03/2020   Cerebrovascular disease 10/03/2020   Seizures (HCC) 11/01/2017   ICH (intracerebral hemorrhage) (HCC) 11/01/2017   PCP:  Lewis Moccasin, MD Pharmacy:   [facility] [address], [place] - [address] [place] [address] Phone: [phone] Fax: [phone]     Social Determinants of Health (SDOH) Interventions    Readmission Risk Interventions No flowsheet data found.

## 2020-12-02 NOTE — NC FL2 (Signed)
[place] MEDICAID FL2 LEVEL OF CARE SCREENING TOOL     IDENTIFICATION  Patient Name: Shariyah Eland Birthdate: [birth year] Sex: female Admission Date (Current Location): 11/28/2020  [facility] and [place] Number:  Producer, television/film/video and Address:  [facility] [place], [address], [place], [place] [address]      Provider Number: [id number]  Attending Physician Name and Address:  Leroy Sea, MD  Relative Name and Phone Number:       Current Level of Care: Hospital Recommended Level of Care: Skilled Nursing Facility Prior Approval Number:    Date Approved/Denied:   PASRR Number: [id number]  Discharge Plan: SNF    Current Diagnoses: Patient Active Problem List   Diagnosis Date Noted   CVA (cerebral vascular accident) (HCC) 11/28/2020   Memory change 10/03/2020   Cerebrovascular disease 10/03/2020   Seizures (HCC) 11/01/2017   ICH (intracerebral hemorrhage) (HCC) 11/01/2017    Orientation RESPIRATION BLADDER Height & Weight     Self, Place  Normal Incontinent Weight: 112 lb 14 oz (51.2 kg) Height:  5\' 2"  (157.5 cm)  BEHAVIORAL SYMPTOMS/MOOD NEUROLOGICAL BOWEL NUTRITION STATUS      Incontinent Diet (see DC summary)  AMBULATORY STATUS COMMUNICATION OF NEEDS Skin   Limited Assist Verbally Normal                       Personal Care Assistance Level of Assistance  Bathing, Feeding, Dressing Bathing Assistance: Limited assistance Feeding assistance: Limited assistance Dressing Assistance: Limited assistance     Functional Limitations Info  Sight Sight Info: Impaired        SPECIAL CARE FACTORS FREQUENCY  PT (By licensed PT), OT (By licensed OT), Speech therapy     PT Frequency: 5x/wk OT Frequency: 5x/wk     Speech Therapy Frequency: 5x/wk      Contractures Contractures Info: Not present    Additional Factors Info  Code Status, Allergies Code Status Info: Full Allergies Info: Sulfa Antibiotics           Current  Medications (12/02/2020):  This is the current hospital active medication list Current Facility-Administered Medications  Medication Dose Route Frequency Provider Last Rate Last Admin   acetaminophen (TYLENOL) tablet 650 mg  650 mg Oral Q6H PRN 12/04/2020, MD       Or   acetaminophen (TYLENOL) suppository 650 mg  650 mg Rectal Q6H PRN Leroy Sea, MD       aspirin chewable tablet 81 mg  81 mg Oral Daily Leroy Sea, MD   81 mg at 12/02/20 0841   atorvastatin (LIPITOR) tablet 40 mg  40 mg Oral Daily 12/04/20, MD   40 mg at 12/02/20 0841   bisacodyl (DULCOLAX) EC tablet 5 mg  5 mg Oral Daily PRN 12/04/20, MD       cholecalciferol (VITAMIN D3) tablet 75 mcg  75 mcg Oral Daily Leroy Sea, MD   75 mcg at 12/02/20 0841   clopidogrel (PLAVIX) tablet 75 mg  75 mg Oral Daily 12/04/20, MD   75 mg at 12/02/20 0841   dextromethorphan-guaiFENesin (MUCINEX DM) 30-600 MG per 12 hr tablet 1 tablet  1 tablet Oral BID 12/04/20, MD   1 tablet at 12/02/20 0847   haloperidol lactate (HALDOL) injection 1 mg  1 mg Intravenous Q8H PRN 12/04/20, MD   1 mg at 11/29/20 2155   heparin injection 5,000 Units  5,000 Units Subcutaneous Q8H Leroy Sea, MD   5,000 Units at 12/02/20 0510   MEDLINE mouth rinse  15 mL Mouth Rinse BID Leroy Sea, MD   15 mL at 12/02/20 0842   memantine (NAMENDA) tablet 10 mg  10 mg Oral BID Leroy Sea, MD   10 mg at 12/02/20 0841   multivitamin with minerals tablet 1 tablet  1 tablet Oral Daily Leroy Sea, MD   1 tablet at 12/02/20 0841   ondansetron (ZOFRAN) tablet 4 mg  4 mg Oral Q6H PRN Leroy Sea, MD       Or   ondansetron [facility]) injection 4 mg  4 mg Intravenous Q6H PRN Leroy Sea, MD         Discharge Medications: Please see discharge summary for a list of discharge medications.  Relevant Imaging Results:  Relevant Lab Results:   Additional Information SS#:  [id number]  Baldemar Lenis, LCSW

## 2020-12-02 NOTE — Progress Notes (Signed)
Physical Therapy Treatment Patient Details Name: Ann Johns MRN: [id number] DOB: March 25, 1933 Today's Date: 12/02/2020   History of Present Illness 87yo female who presented on 10/13, brought to hospital by her son from her ALF after weakness and multiple falls. 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 PMH ICH right frontal lobe 2019, seizures, dementia, HTN    PT Comments    Patient received in bed, very pleasant but quite confused; needed multiple leading VCs to determine that she is in a hospital. Able to mobilize with no more than MinA, however mobility quite limited due to HR elevation up to 136BPM just with bed mobility and transfers. Unable to progress to gait training unfortunately. Needed totalA for pericare. Left in bed with all needs met, bed alarm active and RN aware of patient status. Will continue efforts.     Recommendations for follow up therapy are one component of a multi-disciplinary discharge planning process, led by the attending physician.  Recommendations may be updated based on patient status, additional functional criteria and insurance authorization.  Follow Up Recommendations  SNF;Supervision/Assistance - 24 hour     Equipment Recommendations  Rolling walker with 5" wheels;3in1 (PT);Wheelchair (measurements PT);Wheelchair cushion (measurements PT)    Recommendations for Other Services       Precautions / Restrictions Precautions Precautions: Fall;Other (comment) Precaution Comments: acute PCA territory CVA, hx R frontal ICH, watch HR  Restrictions Weight Bearing Restrictions: No     Mobility  Bed Mobility Overal bed mobility: Needs Assistance Bed Mobility: Supine to Sit;Sit to Supine     Supine to sit: Min guard;HOB elevated Sit to supine: Min guard   General bed mobility comments: min guard and increased time with HOB elevated    Transfers Overall transfer level: Needs assistance Equipment used: Rolling walker (2  wheeled) Transfers: Sit to/from Stand Sit to Stand: Min assist         General transfer comment: Min A to steady and power up to standing; HR to as much as 136BPM (observed)  Ambulation/Gait             General Gait Details: deferred- tachy   Stairs             Wheelchair Mobility    Modified Rankin (Stroke Patients Only)       Balance Overall balance assessment: Needs assistance Sitting-balance support: Feet unsupported;No upper extremity supported Sitting balance-Leahy Scale: Good     Standing balance support: Bilateral upper extremity supported Standing balance-Leahy Scale: Poor Standing balance comment: reliant on BUE support and external assist                            Cognition Arousal/Alertness: Awake/alert Behavior During Therapy: WFL for tasks assessed/performed Overall Cognitive Status: History of cognitive impairments - at baseline Area of Impairment: Orientation;Attention;Memory;Following commands;Safety/judgement;Awareness;Problem solving                 Orientation Level: Disoriented to;Time;Situation Current Attention Level: Selective Memory: Decreased short-term memory;Decreased recall of precautions Following Commands: Follows one step commands with increased time;Follows one step commands consistently Safety/Judgement: Decreased awareness of safety;Decreased awareness of deficits Awareness: Intellectual Problem Solving: Slow processing;Decreased initiation;Difficulty sequencing;Requires verbal cues;Requires tactile cues General Comments: improved as compared to PT eval- but has significant impairment in STM and asked me repeatedly why she was not told she had covid and a CVA; poor insight into deficits and safety. In soiled bed and not  aware of it until PT pointed it out.      Exercises      General Comments General comments (skin integrity, edema, etc.): HR to 136BPM just with bed mobility and transfers; deferred  progression of mobility today      Pertinent Vitals/Pain Pain Assessment: Faces Faces Pain Scale: No hurt Pain Intervention(s): Limited activity within patient's tolerance;Monitored during session    Home Living                      Prior Function            PT Goals (current goals can now be found in the care plan section) Acute Rehab PT Goals Patient Stated Goal: go to rehab PT Goal Formulation: With family Time For Goal Achievement: 12/13/20 Potential to Achieve Goals: Good Progress towards PT goals: Progressing toward goals (slowly)    Frequency    Min 3X/week      PT Plan Current plan remains appropriate    Co-evaluation              AM-PAC PT "6 Clicks" Mobility   Outcome Measure  Help needed turning from your back to your side while in a flat bed without using bedrails?: A Little Help needed moving from lying on your back to sitting on the side of a flat bed without using bedrails?: A Little Help needed moving to and from a bed to a chair (including a wheelchair)?: A Little Help needed standing up from a chair using your arms (e.g., wheelchair or bedside chair)?: A Little Help needed to walk in hospital room?: A Lot Help needed climbing 3-5 steps with a railing? : Total 6 Click Score: 15    End of Session   Activity Tolerance: Treatment limited secondary to medical complications (Comment);Other (comment) (tachycardia) Patient left: in bed;with call bell/phone within reach;with bed alarm set Nurse Communication: Mobility status;Other (comment) (HR) PT Visit Diagnosis: Unsteadiness on feet (R26.81);Muscle weakness (generalized) (M62.81);Difficulty in walking, not elsewhere classified (R26.2);Other symptoms and signs involving the nervous system (R29.898);History of falling (Z91.81)     Time: 4742-5956 PT Time Calculation (min) (ACUTE ONLY): 18 min  Charges:  $Therapeutic Activity: 8-22 mins                    Windell Norfolk, DPT, PN2    Supplemental Physical Therapist [place]    Pager [phone] Acute Rehab Office [phone]

## 2020-12-02 NOTE — Consult Note (Signed)
   [facility] CM Inpatient Consult   12/02/2020  Ann Johns Dec 23, 1933 [id number]  [facility] [[facility]]  Accountable Care Organization [ACO] Patient: Medicare CMS DCE  Primary Care Provider:  Stann Mainland follow up on pcp]  Patient screened for hospitalization with noted to assess for potential [facility]  [[facility]] Care Management service needs for post hospital transition.  Review of patient's medical record reveals patient is currently in isloation, had CVA.  Plan:  Continue to follow progress and disposition to assess for post hospital care management needs.    For questions contact:   Charlesetta Shanks, RN BSN CCM [facility]  [phone] business mobile phone Toll free office [phone]  Fax number: [phone] [email or web address]

## 2020-12-03 ENCOUNTER — Inpatient Hospital Stay (HOSPITAL_COMMUNITY): Payer: Medicare Other

## 2020-12-03 DIAGNOSIS — U071 COVID-19: Secondary | ICD-10-CM | POA: Diagnosis not present

## 2020-12-03 DIAGNOSIS — I63432 Cerebral infarction due to embolism of left posterior cerebral artery: Secondary | ICD-10-CM

## 2020-12-03 DIAGNOSIS — Z8679 Personal history of other diseases of the circulatory system: Secondary | ICD-10-CM

## 2020-12-03 DIAGNOSIS — I639 Cerebral infarction, unspecified: Secondary | ICD-10-CM | POA: Diagnosis not present

## 2020-12-03 MED ORDER — METOPROLOL TARTRATE 25 MG PO TABS
25.0000 mg | ORAL_TABLET | Freq: Two times a day (BID) | ORAL | Status: DC
  Administered 2020-12-03 – 2020-12-09 (×13): 25 mg via ORAL
  Filled 2020-12-03 (×13): qty 1

## 2020-12-03 NOTE — Progress Notes (Signed)
Occupational Therapy Treatment Patient Details Name: Ann Johns MRN: [id number] DOB: Nov 07, 1933 Today's Date: 12/03/2020   History of present illness 87yo female who presented on 10/13, brought to hospital by her son from her ALF after weakness and multiple falls. 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 PMH ICH right frontal lobe 2019, seizures, dementia, HTN   OT comments  Pt progressing towards established OT goals. Pt performing toileting with Min guard-Min A. Pt performing hand hygiene and oral care at sink with Min Guard A for safety in standing and Min cues for sequencing. Pt continues to present with decreased cognition and activity tolerance. Noting non-productive cough throughout session; increasing with mobility. BP elevated; notified RN. Continue to recommend dc to SNF and will continue to follow acutely as admitted. Will continue to follow acutely as admitted.   Recommendations for follow up therapy are one component of a multi-disciplinary discharge planning process, led by the attending physician.  Recommendations may be updated based on patient status, additional functional criteria and insurance authorization.    Follow Up Recommendations  SNF    Equipment Recommendations  None recommended by OT    Recommendations for Other Services      Precautions / Restrictions Precautions Precautions: Fall;Other (comment) Precaution Comments: acute PCA territory CVA, hx R frontal ICH       Mobility Bed Mobility Overal bed mobility: Needs Assistance Bed Mobility: Supine to Sit;Sit to Supine     Supine to sit: Min guard;HOB elevated Sit to supine: Min guard   General bed mobility comments: min guard and increased time with HOB elevated    Transfers Overall transfer level: Needs assistance Equipment used: Rolling walker (2 wheeled);1 person hand held assist Transfers: Sit to/from Stand Sit to Stand: Min assist         General transfer  comment: Min A for gaining balancei nstanding    Balance Overall balance assessment: Needs assistance Sitting-balance support: Feet unsupported;No upper extremity supported Sitting balance-Leahy Scale: Good Sitting balance - Comments: unable to maintain balance during UE MMT in sitting- multidirectional balance loss   Standing balance support: Bilateral upper extremity supported Standing balance-Leahy Scale: Poor Standing balance comment: reliant on BUE support and external assist                           ADL either performed or assessed with clinical judgement   ADL Overall ADL's : Needs assistance/impaired     Grooming: Min guard;Standing;Oral care;Wash/dry hands Grooming Details (indicate cue type and reason): Min Guard A for safety in standing while at the sink. Cues for initation and sequencing - baseline dementia                 Toilet Transfer: Min guard;Minimal assistance;Ambulation;Regular Toilet;RW;Grab bars Toilet Transfer Details (indicate cue type and reason): Min Guard A for safety with descent. Min A for power up into standing from toilet seat Toileting- Clothing Manipulation and Hygiene: Sitting/lateral lean;Min guard       Functional mobility during ADLs: Min guard;Minimal assistance;Rolling walker General ADL Comments: Pt presenting with fatigue and decreased activity tolerance     Vision   Vision Assessment?: No apparent visual deficits   Perception     Praxis      Cognition Arousal/Alertness: Awake/alert Behavior During Therapy: WFL for tasks assessed/performed Overall Cognitive Status: History of cognitive impairments - at baseline Area of Impairment: Orientation;Attention;Memory;Following commands;Safety/judgement;Awareness;Problem solving  Orientation Level: Disoriented to;Time;Situation Current Attention Level: Selective Memory: Decreased short-term memory;Decreased recall of precautions Following  Commands: Follows one step commands with increased time;Follows one step commands consistently Safety/Judgement: Decreased awareness of safety;Decreased awareness of deficits Awareness: Intellectual Problem Solving: Slow processing;Decreased initiation;Difficulty sequencing;Requires verbal cues;Requires tactile cues General Comments: Requiring increased time and cues. Able to follow simple commands. agreeable to oob activity. poor safety awareness        Exercises     Shoulder Instructions       General Comments BP elevated. Notified RN    Pertinent Vitals/ Pain       Pain Assessment: Faces Faces Pain Scale: No hurt Pain Intervention(s): Monitored during session;Limited activity within patient's tolerance;Repositioned  Home Living                                          Prior Functioning/Environment              Frequency  Min 2X/week        Progress Toward Goals  OT Goals(current goals can now be found in the care plan section)  Progress towards OT goals: Progressing toward goals  Acute Rehab OT Goals Patient Stated Goal: go to rehab OT Goal Formulation: With patient Time For Goal Achievement: 12/14/20 Potential to Achieve Goals: Good ADL Goals Pt Will Perform Grooming: with modified independence;standing Pt Will Perform Lower Body Bathing: with supervision;sitting/lateral leans;sit to/from stand Pt Will Perform Lower Body Dressing: with supervision;sitting/lateral leans;sit to/from stand Pt Will Transfer to Toilet: with supervision;ambulating Additional ADL Goal #1: Pt will maintain dynamic standing balance at the sink using 1 UE for support to complete grooming tasks.  Plan Discharge plan remains appropriate    Co-evaluation                 AM-PAC OT "6 Clicks" Daily Activity     Outcome Measure   Help from another person eating meals?: A Little Help from another person taking care of personal grooming?: A Little Help from  another person toileting, which includes using toliet, bedpan, or urinal?: A Lot Help from another person bathing (including washing, rinsing, drying)?: A Lot Help from another person to put on and taking off regular upper body clothing?: A Little Help from another person to put on and taking off regular lower body clothing?: A Lot 6 Click Score: 15    End of Session Equipment Utilized During Treatment: Gait belt;Rolling walker  OT Visit Diagnosis: Unsteadiness on feet (R26.81);Other abnormalities of gait and mobility (R26.89);Muscle weakness (generalized) (M62.81);Repeated falls (R29.6)   Activity Tolerance Patient tolerated treatment well   Patient Left in bed;with call bell/phone within reach;with bed alarm set   Nurse Communication Mobility status        Time: 2993-7169 OT Time Calculation (min): 33 min  Charges: OT General Charges $OT Visit: 1 Visit OT Treatments $Self Care/Home Management : 23-37 mins  Raymie Trani MSOT, OTR/L Acute Rehab Pager: [phone] Office: [phone]  Theodoro Grist Younique Casad 12/03/2020, 8:50 AM

## 2020-12-03 NOTE — TOC Progression Note (Addendum)
Transition of Care [facility]) - Progression Note    Patient Details  Name: Ann Johns MRN: [id number] Date of Birth: 02-16-1934  [facility] CM/SW Contact  Baldemar Lenis, [place] Phone Number: 12/03/2020, 10:47 AM  Clinical Narrative:   CSW sent bed offers to patient's son, who will discuss with family. CSW to follow.  UPDATE 3:50 PM: Family has chosen [facility] and they can admit patient when no longer requiring isolation. CSW to follow.    Expected Discharge Plan: Skilled Nursing Facility Barriers to Discharge: Continued Medical Work up, SNF Covid  Expected Discharge Plan and Services Expected Discharge Plan: Skilled Nursing Facility     Post Acute Care Choice: Skilled Nursing Facility Living arrangements for the past 2 months: Independent Living Facility Expected Discharge Date: 12/02/20                                     Social Determinants of Health (SDOH) Interventions    Readmission Risk Interventions No flowsheet data found.

## 2020-12-03 NOTE — Progress Notes (Signed)
Modified Barium Swallow Progress Note  Patient Details  Name: Ann Johns MRN: [id number] Date of Birth: 05-01-1933  Today's Date: 12/03/2020  Modified Barium Swallow completed.  Full report located under Chart Review in the Imaging Section.  Brief recommendations include the following:  Clinical Impression  Pt demonstrated moderate oropharyngeal dysphagia with aspiration present. Oral delays, decreased cohesion and reduced manipulation describe her oral phase. Pt's epiglottis did not fully invert during all swallows and timing of laryngeal closure was late intermittently leading to penetration to vocal cords (PAS 5) and aspiration with straw (PAS 8). Material also penetrated and aspirated during multiple swallows. Reduced lingual retraction led to vallecular residue and pyriform residue present with mild increase in volume after thicker consistencies. Given pt's cognitive state, compensatory techniques were not attempted. Recommend continue soft texture and upgrade to honey thick liquids, multiple swallows, throat clear intermittently, crush pills and full supervision.   Swallow Evaluation Recommendations       SLP Diet Recommendations: Honey thick liquids;Other (Comment) (soft)   Liquid Administration via: Cup   Medication Administration: Crushed with puree   Supervision: Staff to assist with self feeding;Patient able to self feed;Full supervision/cueing for compensatory strategies   Compensations: Minimize environmental distractions;Slow rate;Small sips/bites;Multiple dry swallows after each bite/sip;Clear throat intermittently   Postural Changes: Seated upright at 90 degrees   Oral Care Recommendations: Oral care BID        Royce Macadamia 12/03/2020,4:58 PM

## 2020-12-03 NOTE — Plan of Care (Signed)
  Problem: Safety: Goal: Non-violent Restraint(s) Outcome: Progressing   Problem: Education: Goal: Knowledge of General Education information will improve Description: Including pain rating scale, medication(s)/side effects and non-pharmacologic comfort measures Outcome: Not Progressing

## 2020-12-03 NOTE — Progress Notes (Signed)
PROGRESS NOTE                                                                                                                                                                                                             Patient Demographics:    Ann Johns, is a 87 y.o. female, DOB - 09/29/1933, GNF:[id number]  Outpatient Primary MD for the patient is Lewis Moccasin, MD    LOS - 5  Admit date - 11/28/2020    Chief Complaint  Patient presents with   Fall   Altered Mental Status       Brief Narrative (HPI from H&P)   Ann Johns  is a 87 y.o. female, with history of intracerebral hemorrhage in 2019, seizures, early dementia, essential hypertension who lives at an assisted living facility was brought in by her son to the hospital for evaluation.  According to the son she was in today morning around 9 AM when she had a fall at assisted living facility, she subsequently did well however she was feeling weak the whole day, this morning she had another 4 at assisted living facility after which she was brought to the [facility] ER where she was found to have an acute stroke, UTI and a COVID-19 infection.  Neurology was consulted and I was requested to admit the patient to admit the patient   Subjective:   Patient in bed, appears comfortable, denies any headache, no fever, no chest pain or pressure, no shortness of breath , no abdominal pain. No new focal weakness.   Assessment  & Plan :     1. Left PCA - CVA in the Subcortical left parietooccipital lobes and callosal splenium - causing x 2 falls, starting over 36 hrs before admission - she thankfully has mild deficits, Neuro following, ASA - Statin for now, Plavix x 3 weeks, PT - OT - SLP, will need SNF. Await bed.   2. UTI - Rocephin x 3, negative cultures.   3.  COVID infection.  She has had 2 doses of vaccine so far, she did have a dry cough, moderate inflammatory markers  finished short course of Decadron IV and Remdesivir - resolved clinically.   4.  Hypertension.  Allow for permissive hypertension since > 3 days, low dose Lopressor added on 12/03/20   5.  Dementia.  At risk  for delirium.  Continue Namenda at home dose, minimize benzodiazepines and narcotics, son warned about possible worsening delirium while in the hospital.   SpO2: 97 %  Recent Labs  Lab 11/28/20 1014 11/28/20 1051 11/28/20 1701 11/29/20 0219 11/30/20 0453 12/01/20 0359  WBC  --  10.0 8.0 7.4  --  10.4  HGB  --  11.9* 12.1 12.7  --  12.3  HCT  --  35.4* 38.8 39.1  --  35.6*  PLT  --  250 259 269  --  320  CRP  --   --  11.5* 13.5*  --  5.2*  BNP  --   --   --  60.9 268.0* 88.1  DDIMER  --   --  1.45* 1.15* 0.71* 0.54*  AST  --   --   --  25  --  22  ALT  --   --   --  15  --  16  ALKPHOS  --   --   --  75  --  62  BILITOT  --   --   --  1.3*  --  0.7  ALBUMIN  --   --   --  3.6  --  3.2*  SARSCOV2NAA POSITIVE*  --   --   --   --   --            Condition - Extremely Guarded  Family Communication  :  son at the time of admission, [phone] message left 11/29/20 at 11.44 pm, 11/30/20 @11 .12 am - answering machine - message left  Baxter Kail [phone] 11/30/20, 12/01/20   Code Status :  Full  Consults  :  Neuro  PUD Prophylaxis :    Procedures  :     MRI - Moderate-sized acute/early subacute left PCA territory infarct affecting the cortical/subcortical left parietooccipital lobes and callosal splenium. Redemonstrated chronic encephalomalacia and associated chronic blood products in the anterior frontal lobes, and anterior right temporal lobe, presumably posttraumatic in etiology. Chronic small-vessel ischemic changes which are severe in the cerebral white matter, and mild in the pons. Mild-to-moderate generalized cerebral atrophy. Comparatively mild cerebellar atrophy. Paranasal sinus disease  MRA - 1. No emergent large vessel occlusion or high-grade stenosis. 2.  Mild narrowing of the right PCA P2 segment.    EEG - non acute  Carotid US - stable.  TTE - 1. Left ventricular ejection fraction, by estimation, is 70 to 75%. The left ventricle has hyperdynamic function. The left ventricle has no regional wall motion abnormalities. There is moderate asymmetric left ventricular hypertrophy of the basal-septal  segment. Indeterminate diastolic filling due to E-A fusion.  2. Right ventricular systolic function is normal. The right ventricular size is normal.  3. The mitral valve is grossly normal. Mild mitral valve regurgitation. No evidence of mitral stenosis.  4. The aortic valve was not well visualized. There is mild calcification of the aortic valve. Aortic valve regurgitation is mild to moderate. No aortic stenosis is present      Disposition Plan  :    Status is: Inpatient - Remains inpatient appropriate because: CVA , Covid, placement  DVT Prophylaxis  :    heparin injection 5,000 Units Start: 11/28/20 1715    Lab Results  Component Value Date   PLT 320 12/01/2020    Diet :  Diet Order             DIET SOFT Room service appropriate? Yes with Assist; Fluid consistency: Nectar Thick  Diet effective now                    Inpatient Medications  Scheduled Meds:  aspirin  81 mg Oral Daily   atorvastatin  40 mg Oral Daily   cholecalciferol  75 mcg Oral Daily   clopidogrel  75 mg Oral Daily   dextromethorphan-guaiFENesin  1 tablet Oral BID   heparin  5,000 Units Subcutaneous Q8H   mouth rinse  15 mL Mouth Rinse BID   memantine  10 mg Oral BID   metoprolol tartrate  25 mg Oral BID   multivitamin with minerals  1 tablet Oral Daily   Continuous Infusions:   PRN Meds:.acetaminophen **OR** acetaminophen, bisacodyl, haloperidol lactate, ondansetron **OR** ondansetron (ZOFRAN) IV  Antibiotics  :    Anti-infectives (From admission, onward)    Start     Dose/Rate Route Frequency Ordered Stop   11/29/20 1000  remdesivir 100 mg in  sodium chloride 0.9 % 100 mL IVPB  Status:  Discontinued       See Hyperspace for full Linked Orders Report.   100 mg 200 mL/hr over 30 Minutes Intravenous Daily 11/28/20 1743 11/30/20 1242   11/28/20 1900  remdesivir 200 mg in sodium chloride 0.9% 250 mL IVPB       See Hyperspace for full Linked Orders Report.   200 mg 580 mL/hr over 30 Minutes Intravenous Once 11/28/20 1743 11/28/20 2232   11/28/20 1715  cefTRIAXone (ROCEPHIN) 1 g in sodium chloride 0.9 % 100 mL IVPB        1 g 200 mL/hr over 30 Minutes Intravenous Every 24 hours 11/28/20 1700 11/30/20 1625   11/28/20 1330  cephALEXin (KEFLEX) capsule 500 mg  Status:  Discontinued        500 mg Oral Every 8 hours 11/28/20 1328 11/28/20 1700        Time Spent in minutes  30   Susa Raring M.D on 12/03/2020 at 9:58 AM  To page go to [email or web address]   [facility] -  Office  [phone]  See all Orders from today for further details    Objective:   Vitals:   12/03/20 0434 12/03/20 0800 12/03/20 0818 12/03/20 0900  BP: (!) 142/78 (!) 179/97 (!) 160/95   Pulse: (!) 116 (!) 121 (!) 123 (!) 110  Resp: 16     Temp: 97.6 F (36.4 C)     TempSrc: Axillary     SpO2: 97%     Weight:      Height:        Wt Readings from Last 3 Encounters:  11/29/20 51.2 kg  10/03/20 51.7 kg  04/02/20 54 kg     Intake/Output Summary (Last 24 hours) at 12/03/2020 0958 Last data filed at 12/03/2020 0446 Gross per 24 hour  Intake 240 ml  Output 450 ml  Net -210 ml     Physical Exam  Awake Alert x1, No new F.N deficits, Normal affect [place].AT,PERRAL Supple Neck, No JVD,   Symmetrical Chest wall movement, Good air movement bilaterally, CTAB RRR,No Gallops, Rubs or new Murmurs,  +ve B.Sounds, Abd Soft, No tenderness,   No Cyanosis, Clubbing or edema,       Data Review:    CBC Recent Labs  Lab 11/28/20 1051 11/28/20 1701 11/29/20 0219 12/01/20 0359  WBC 10.0 8.0 7.4 10.4  HGB 11.9* 12.1 12.7 12.3  HCT 35.4* 38.8  39.1 35.6*  PLT 250 259 269 320  MCV 91.5 99.0 93.3 88.1  MCH 30.7 30.9 30.3 30.4  MCHC 33.6 31.2 32.5 34.6  RDW 12.4 12.3 12.4 12.6  LYMPHSABS 1.3  --  1.0 1.6  MONOABS 1.1*  --  0.2 0.6  EOSABS 0.0  --  0.0 0.0  BASOSABS 0.0  --  0.0 0.0    Recent Labs  Lab 11/28/20 1051 11/28/20 1701 11/29/20 0219 11/30/20 0453 12/01/20 0359  NA 131*  --  135  --  135  K 3.7  --  3.9  --  3.7  CL 98  --  103  --  103  CO2 22  --  17*  --  22  GLUCOSE 116*  --  184*  --  166*  BUN 9  --  7*  --  13  CREATININE 0.45 0.46 0.62  --  0.50  CALCIUM 8.8*  --  9.0  --  8.8*  AST  --   --  25  --  22  ALT  --   --  15  --  16  ALKPHOS  --   --  75  --  62  BILITOT  --   --  1.3*  --  0.7  ALBUMIN  --   --  3.6  --  3.2*  MG  --   --  1.8  --  1.9  CRP  --  11.5* 13.5*  --  5.2*  DDIMER  --  1.45* 1.15* 0.71* 0.54*  HGBA1C  --   --  5.7*  --   --   BNP  --   --  60.9 268.0* 88.1    ------------------------------------------------------------------------------------------------------------------ No results for input(s): CHOL, HDL, LDLCALC, TRIG, CHOLHDL, LDLDIRECT in the last 72 hours.   Lab Results  Component Value Date   HGBA1C 5.7 (H) 11/29/2020   ------------------------------------------------------------------------------------------------------------------ No results for input(s): TSH, T4TOTAL, T3FREE, THYROIDAB in the last 72 hours.  Invalid input(s): FREET3  Cardiac Enzymes No results for input(s): CKMB, TROPONINI, MYOGLOBIN in the last 168 hours.  Invalid input(s): CK ------------------------------------------------------------------------------------------------------------------    Component Value Date/Time   BNP 88.1 12/01/2020 0359     Radiology Reports DG Chest 1 View  Result Date: 11/28/2020 CLINICAL DATA:  Stroke.  Pre MRI metal screening EXAM: CHEST  1 VIEW; PELVIS - 1-2 VIEW; ABDOMEN - 1 VIEW COMPARISON:  None. FINDINGS: The heart and mediastinal contours  are within normal limits. Aortic calcification. 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 No focal consolidation. No pulmonary edema. No pleural effusion. No pneumothorax. Nonobstructive bowel gas pattern. No acute osseous abnormality. Multilevel degenerative changes of the thoracolumbar spine. No retained radiopaque foreign body. IMPRESSION: 1. No retained radiopaque foreign body. 2. No acute cardiopulmonary disease. 3. Nonobstructive bowel gas pattern. 4.  Aortic Atherosclerosis (ICD10-I70.0). Electronically Signed   By: Tish Frederickson M.D.   On: 11/28/2020 15:19   DG Pelvis 1-2 Views  Result Date: 11/28/2020 CLINICAL DATA:  Stroke.  Pre MRI metal screening EXAM: CHEST  1 VIEW; PELVIS - 1-2 VIEW; ABDOMEN - 1 VIEW COMPARISON:  None. FINDINGS: The heart and mediastinal contours are within normal limits. Aortic calcification. 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 No focal consolidation. No pulmonary edema. No pleural effusion. No pneumothorax. Nonobstructive bowel gas pattern. No acute osseous abnormality. Multilevel degenerative changes of the thoracolumbar spine. No retained radiopaque foreign body. IMPRESSION: 1. No retained radiopaque foreign body. 2. No acute cardiopulmonary disease. 3. Nonobstructive bowel gas pattern. 4.  Aortic Atherosclerosis (ICD10-I70.0). Electronically Signed   By: Tish Frederickson  M.D.   On: 11/28/2020 15:19   DG Abd 1 View  Result Date: 11/28/2020 CLINICAL DATA:  Stroke.  Pre MRI metal screening EXAM: CHEST  1 VIEW; PELVIS - 1-2 VIEW; ABDOMEN - 1 VIEW COMPARISON:  None. FINDINGS: The heart and mediastinal contours are within normal limits. Aortic calcification. 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 No focal consolidation. No pulmonary edema. No pleural effusion. No pneumothorax. Nonobstructive bowel gas pattern. No acute osseous abnormality. Multilevel degenerative changes of the thoracolumbar spine. No retained radiopaque foreign body. IMPRESSION: 1. No  retained radiopaque foreign body. 2. No acute cardiopulmonary disease. 3. Nonobstructive bowel gas pattern. 4.  Aortic Atherosclerosis (ICD10-I70.0). Electronically Signed   By: Tish Frederickson M.D.   On: 11/28/2020 15:19   CT HEAD WO CONTRAST ( )  Result Date: 11/28/2020 CLINICAL DATA:  Head trauma, minor (Age >= 65y) hx of dementia with repeated falls recently, hematoma near right eye, evaluate for ICH EXAM: CT HEAD WITHOUT CONTRAST TECHNIQUE: Contiguous axial images were obtained from the base of the skull through the vertex without intravenous contrast. COMPARISON:  MRI 04/16/2020, CT 12/24/2018 FINDINGS: Brain: New area of low attenuation in the anteroinferior left frontal lobe with loss of gray-white differentiation and developing encephalomalacia is new from prior and likely represents a late subacute or chronic left ACA territory infarction. Chronic right anterior frontal lobe infarct is unchanged. No intracranial hemorrhage. No extra-axial collection. Stable size and configuration of the ventricles. No mass lesion or mass effect. Extensive low-density changes within the periventricular and subcortical white matter compatible with chronic microvascular ischemic change. Mild diffuse cerebral volume loss. Vascular: Atherosclerotic calcifications involving the large vessels of the skull base. No unexpected hyperdense vessel. Skull: Normal. Negative for fracture or focal lesion. Sinuses/Orbits: Mucosal thickening and partial opacification of the right maxillary sinus. There is extensive mucosal thickening within the bilateral ethmoid air cells. Orbital structures within normal limits. Other: Right periorbital soft tissue swelling. IMPRESSION: 1. Low-attenuation changes in the left frontal lobe is new from prior and likely represents a late subacute to chronic left ACA territory infarction. Further evaluation with MRI of the brain is recommended. 2. No intracranial hemorrhage identified. 3. Chronic right  anterior frontal lobe infarct is unchanged. 4. Right periorbital soft tissue swelling. 5. Paranasal sinus disease as described. Electronically Signed   By: Duanne Guess D.O.   On: 11/28/2020 12:58   MR ANGIO HEAD WO CONTRAST  Result Date: 11/28/2020 CLINICAL DATA:  Acute neurologic deficit EXAM: MRA HEAD WITHOUT CONTRAST TECHNIQUE: Angiographic images of the Circle of Willis were acquired using MRA technique without intravenous contrast. COMPARISON:  No pertinent prior exam. FINDINGS: POSTERIOR CIRCULATION: --Vertebral arteries: Normal --Inferior cerebellar arteries: Normal. --Basilar artery: Normal. --Superior cerebellar arteries: Normal. --Posterior cerebral arteries: Mild narrowing of the right PCA P2 segment. Normal left. ANTERIOR CIRCULATION: --Intracranial internal carotid arteries: Normal. --Anterior cerebral arteries (ACA): Normal. --Middle cerebral arteries (MCA): Normal. ANATOMIC VARIANTS: None IMPRESSION: 1. No emergent large vessel occlusion or high-grade stenosis. 2. Mild narrowing of the right PCA P2 segment. Electronically Signed   By: Deatra Robinson M.D.   On: 11/28/2020 23:20   MR BRAIN WO CONTRAST  Result Date: 11/28/2020 CLINICAL DATA:  Stroke, follow-up; CT head with question of evolving or new subacute ACA infarct, patient with history of dementia and prior CVA, follow-up imaging. EXAM: MRI HEAD WITHOUT CONTRAST TECHNIQUE: Multiplanar, multiecho pulse sequences of the brain and surrounding structures were obtained without intravenous contrast. COMPARISON:  Prior head CT 11/28/2020.  Brain MRI 04/16/2020.  FINDINGS: Brain: Mild to moderate generalized cerebral atrophy. Comparatively mild cerebellar atrophy. Moderate-sized acute/early subacute infarct within the cortical/subcortical left parietooccipital lobes and within the callosal splenium (PCA vascular territory). Redemonstrated chronic foci of encephalomalacia within the anterior frontal lobes and anterior right temporal lobe,  with associated chronic blood products. Background advanced patchy and confluent T2 FLAIR hyperintense signal abnormality within the cerebral white matter, nonspecific but compatible with chronic small vessel ischemic disease. Mild chronic small vessel ischemic changes are also present within the pons. No evidence of an intracranial mass. No extra-axial fluid collection. No midline shift. Partially empty sella turcica. Vascular: No definite loss of expected proximal arterial flow voids. Skull and upper cervical spine: No focal suspicious marrow lesion. Incompletely assessed cervical spondylosis. Sinuses/Orbits: Visualized orbits show no acute finding. Mild-to-moderate mucosal thickening within the bilateral ethmoid and right maxillary sinuses. Superimposed small volume frothy secretions within the right maxillary sinus. Mild mucosal thickening also present within the bilateral sphenoid and left maxillary sinuses. Other: 13 mm left parietal scalp lesion, nonspecific but possibly reflecting a sebaceous cyst. IMPRESSION: Moderate-sized acute/early subacute left PCA territory infarct affecting the cortical/subcortical left parietooccipital lobes and callosal splenium. Redemonstrated chronic encephalomalacia and associated chronic blood products in the anterior frontal lobes, and anterior right temporal lobe, presumably posttraumatic in etiology. Chronic small-vessel ischemic changes which are severe in the cerebral white matter, and mild in the pons. Mild-to-moderate generalized cerebral atrophy. Comparatively mild cerebellar atrophy. Paranasal sinus disease, as described. Electronically Signed   By: Jackey Loge D.O.   On: 11/28/2020 16:02   DG Chest Port 1 View  Result Date: 11/29/2020 CLINICAL DATA:  Shortness of breath.  Altered mental status. EXAM: PORTABLE CHEST 1 VIEW COMPARISON:  11/28/2020 FINDINGS: The patient is rotated to the right with grossly unchanged cardiomediastinal silhouette. Eventration of the  right hemidiaphragm is again noted. No confluent airspace opacity, edema, sizable pleural effusion, or pneumothorax is identified. Thoracic levoscoliosis is noted. IMPRESSION: No active disease. Electronically Signed   By: Sebastian Ache M.D.   On: 11/29/2020 05:31   EEG adult  Result Date: 11/29/2020 Charlsie Quest, MD     11/29/2020 12:33 PM Patient Name: Jaana Brodt MRN: [id number] Epilepsy Attending: Charlsie Quest Referring Physician/Provider: Janey Genta, NP Date: 11/29/2020 Duration: 25.17 mins Patient history: 87yo F with ams. EEG to evaluate for seizure Level of alertness: Awake, asleep AEDs during EEG study: None Technical aspects: This EEG study was done with scalp electrodes positioned according to the 10-20 International system of electrode placement. Electrical activity was acquired at a sampling rate of  and reviewed with a high frequency filter of  and a low frequency filter of . EEG data were recorded continuously and digitally stored. Description: The posterior dominant rhythm consists of 7.5 Hz activity of moderate voltage (25-35 uV) seen predominantly in posterior head regions, symmetric and reactive to eye opening and eye closing. Sleep was characterized by vertex waves, sleep spindles (12 to 14 Hz), maximal frontocentral region. EEG showed intermittent generalized 3 to 6 Hz theta-delta slowing. Hyperventilation and photic stimulation were not performed.   ABNORMALITY - Intermittent slow, generalized IMPRESSION: This study is suggestive of mild diffuse encephalopathy, nonspecific etiology. No seizures or epileptiform discharges were seen throughout the recording. Charlsie Quest   ECHOCARDIOGRAM COMPLETE  Result Date: 11/29/2020    ECHOCARDIOGRAM REPORT   Patient Name:   BROOKELIN FELBER Date of Exam: 11/29/2020 Medical Rec #:  [id number]     Height:  62.0 in Accession #:    [id number]    Weight:       114.0 lb Date of Birth:  01-Mar-1933     BSA:           1.505 m Patient Age:    87 years      BP:           157/82 mmHg Patient Gender: F             HR:           98 bpm. Exam Location:  Inpatient Procedure: 2D Echo, Cardiac Doppler, Color Doppler and Intracardiac            Opacification Agent Indications:    Stroke I63.9  History:        Patient has prior history of Echocardiogram examinations, most                 recent 11/12/2017.  Sonographer:    Leta Jungling RDCS Referring Phys: Heide Scales [facility]  Sonographer Comments: Technically difficult study due to poor echo windows. IMPRESSIONS  1. Left ventricular ejection fraction, by estimation, is 70 to 75%. The left ventricle has hyperdynamic function. The left ventricle has no regional wall motion abnormalities. There is moderate asymmetric left ventricular hypertrophy of the basal-septal  segment. Indeterminate diastolic filling due to E-A fusion.  2. Right ventricular systolic function is normal. The right ventricular size is normal.  3. The mitral valve is grossly normal. Mild mitral valve regurgitation. No evidence of mitral stenosis.  4. The aortic valve was not well visualized. There is mild calcification of the aortic valve. Aortic valve regurgitation is mild to moderate. No aortic stenosis is present. Conclusion(s)/Recommendation(s): No intracardiac source of embolism detected on this transthoracic study. A transesophageal echocardiogram is recommended to exclude cardiac source of embolism if clinically indicated. FINDINGS  Left Ventricle: Left ventricular ejection fraction, by estimation, is 70 to 75%. The left ventricle has hyperdynamic function. The left ventricle has no regional wall motion abnormalities. Definity contrast agent was given IV to delineate the left ventricular endocardial borders. The left ventricular internal cavity size was normal in size. There is moderate asymmetric left ventricular hypertrophy of the basal-septal segment. Indeterminate diastolic filling due to E-A fusion. Right  Ventricle: The right ventricular size is normal. No increase in right ventricular wall thickness. Right ventricular systolic function is normal. Left Atrium: Left atrial size was normal in size. Right Atrium: Right atrial size was normal in size. Pericardium: There is no evidence of pericardial effusion. Presence of pericardial fat pad. Mitral Valve: The mitral valve is grossly normal. Mild mitral valve regurgitation. No evidence of mitral valve stenosis. Tricuspid Valve: The tricuspid valve is normal in structure. Tricuspid valve regurgitation is mild . No evidence of tricuspid stenosis. Aortic Valve: The aortic valve was not well visualized. There is mild calcification of the aortic valve. Aortic valve regurgitation is mild to moderate. No aortic stenosis is present. Pulmonic Valve: The pulmonic valve was not well visualized. Pulmonic valve regurgitation is mild to moderate. No evidence of pulmonic stenosis. Aorta: The aortic root is normal in size and structure. Venous: The inferior vena cava was not well visualized. IAS/Shunts: The interatrial septum was not well visualized.  LEFT VENTRICLE PLAX 2D LVIDd:         3.40 cm   Diastology LVIDs:         2.90 cm   LV e' medial:    5.00 cm/s LV PW:  0.90 cm   LV E/e' medial:  12.0 LV IVS:        1.30 cm   LV e' lateral:   5.66 cm/s LVOT diam:     2.10 cm   LV E/e' lateral: 10.6 LV SV:         38 LV SV Index:   25 LVOT Area:     3.46 cm  RIGHT VENTRICLE RV S prime:     16.80 cm/s LEFT ATRIUM           Index LA diam:      2.70 cm 1.79 cm/m LA Vol (A4C): 14.5 ml 9.63 ml/m  AORTIC VALVE LVOT Vmax:   72.70 cm/s LVOT Vmean:  49.000 cm/s LVOT VTI:    0.109 m  AORTA Ao Root diam: 3.30 cm Ao Asc diam:  3.20 cm MITRAL VALVE                TRICUSPID VALVE MV Area (PHT): 5.46 cm     TR Peak grad:   23.2 mmHg MV Decel Time: 139 msec     TR Vmax:        241.00 cm/s MV E velocity: 59.75 cm/s MV A velocity: 104.00 cm/s  SHUNTS MV E/A ratio:  0.57         Systemic VTI:  0.11  m                             Systemic Diam: 2.10 cm Weston Brass MD Electronically signed by Weston Brass MD Signature Date/Time: 11/29/2020/6:57:24 PM    Final    VAS US CAROTID (at [facility] and [facility] only)  Result Date: 11/29/2020 Carotid Arterial Duplex Study Patient Name:  GARNETTE GREB  Date of Exam:   11/29/2020 Medical Rec #: [id number]      Accession #:    [id number] Date of Birth: [birth year]      Patient Gender: F Patient Age:   53 years Exam Location:  [facility] Procedure:      VAS US CAROTID Referring Phys: Terrilee Files [facility] --------------------------------------------------------------------------------  Indications:       CVA and recent falls, weakness, COVID+. Risk Factors:      Hypertension. Other Factors:     HX of ICH (2019). Comparison Study:  Previous exam 12/07/2017 1-39% BIL Performing Technologist: Ernestene Mention RVT, RDMS  Examination Guidelines: A complete evaluation includes B-mode imaging, spectral Doppler, color Doppler, and power Doppler as needed of all accessible portions of each vessel. Bilateral testing is considered an integral part of a complete examination. Limited examinations for reoccurring indications may be performed as noted.  Right Carotid Findings: +----------+--------+--------+--------+------------------+------------------+           PSV cm/sEDV cm/sStenosisPlaque DescriptionComments           +----------+--------+--------+--------+------------------+------------------+ CCA Prox  61      9                                 intimal thickening +----------+--------+--------+--------+------------------+------------------+ CCA Distal62      14                                intimal thickening +----------+--------+--------+--------+------------------+------------------+ ICA Prox  35      10              calcific and focaltortuous           +----------+--------+--------+--------+------------------+------------------+  ICA Distal74      17                                 tortuous           +----------+--------+--------+--------+------------------+------------------+ ECA       65      0               calcific                             +----------+--------+--------+--------+------------------+------------------+ +----------+--------+-------+----------------+-------------------+           PSV cm/sEDV cmsDescribe        Arm Pressure (mmHG) +----------+--------+-------+----------------+-------------------+ [id number]             Multiphasic, WNL                    +----------+--------+-------+----------------+-------------------+ +---------+--------+--+--------+--+---------+ VertebralPSV cm/s58EDV cm/s11Antegrade +---------+--------+--+--------+--+---------+  Left Carotid Findings: +----------+--------+--------+--------+------------------+---------------------+           PSV cm/sEDV cm/sStenosisPlaque DescriptionComments              +----------+--------+--------+--------+------------------+---------------------+ CCA Prox  53      11                                                      +----------+--------+--------+--------+------------------+---------------------+ CCA Distal49      14                                intimal thickening    +----------+--------+--------+--------+------------------+---------------------+ ICA Prox  41      14              calcific          intimal thickening &                                                      tortuous              +----------+--------+--------+--------+------------------+---------------------+ ICA Distal101     27                                                      +----------+--------+--------+--------+------------------+---------------------+ ECA       40      0               calcific                                +----------+--------+--------+--------+------------------+---------------------+  +----------+--------+--------+----------------+-------------------+           PSV cm/sEDV cm/sDescribe        Arm Pressure (mmHG) +----------+--------+--------+----------------+-------------------+ Subclavian102             Multiphasic, WNL                    +----------+--------+--------+----------------+-------------------+ +---------+--------+--+--------+--+---------+  VertebralPSV cm/s52EDV cm/s11Antegrade +---------+--------+--+--------+--+---------+   Summary: Right Carotid: Velocities in the right ICA are consistent with a 1-39% stenosis.                The extracranial vessels were near-normal with only minimal wall                thickening or plaque. Left Carotid: Velocities in the left ICA are consistent with a 1-39% stenosis.               The extracranial vessels were near-normal with only minimal wall               thickening or plaque. Vertebrals:  Bilateral vertebral arteries demonstrate antegrade flow. Subclavians: Normal flow hemodynamics were seen in bilateral subclavian              arteries. *See table(s) above for measurements and observations.  Electronically signed by Sherald Hess MD on 11/29/2020 at 4:10:26 PM.    Final

## 2020-12-04 MED ORDER — ENSURE ENLIVE PO LIQD
237.0000 mL | Freq: Three times a day (TID) | ORAL | Status: DC
  Administered 2020-12-04 – 2020-12-09 (×12): 237 mL via ORAL

## 2020-12-04 NOTE — Progress Notes (Signed)
Physical Therapy Treatment Patient Details Name: Ann Johns MRN: [id number] DOB: 01/30/1934 Today's Date: 12/04/2020   History of Present Illness 87yo female who presented on 10/13, brought to hospital by her son from her ALF after weakness and multiple falls. 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 PMH ICH right frontal lobe 2019, seizures, dementia, HTN    PT Comments    Patient received in bed, pleasantly confused and cooperative. Continues to have impaired safety awareness/insight into deficits and memory- very surprised to learn she had had a stroke! Able to progress mobility today with HR up to 123BPM max observed, asymptomatic, VSS otherwise. Left up in recliner with all needs met, chair alarm active and RN aware of patient status. Will continue efforts.   Recommendations for follow up therapy are one component of a multi-disciplinary discharge planning process, led by the attending physician.  Recommendations may be updated based on patient status, additional functional criteria and insurance authorization.  Follow Up Recommendations  SNF;Supervision/Assistance - 24 hour     Equipment Recommendations  Rolling walker with 5" wheels;3in1 (PT);Wheelchair (measurements PT);Wheelchair cushion (measurements PT)    Recommendations for Other Services       Precautions / Restrictions Precautions Precautions: Fall;Other (comment) Precaution Comments: acute PCA territory CVA, hx R frontal ICH, watch HR Restrictions Weight Bearing Restrictions: No     Mobility  Bed Mobility Overal bed mobility: Needs Assistance Bed Mobility: Supine to Sit     Supine to sit: Min guard;HOB elevated     General bed mobility comments: min guard and increased time with HOB elevated    Transfers Overall transfer level: Needs assistance Equipment used: Rolling walker (2 wheeled) Transfers: Sit to/from Stand Sit to Stand: Min guard         General transfer comment:  min guard for boosting to standing, extra time/effort and cues for hand placement  Ambulation/Gait Ambulation/Gait assistance: Min assist Gait Distance (Feet): 30 Feet Assistive device: Rolling walker (2 wheeled) Gait Pattern/deviations: Step-through pattern;Trunk flexed;Decreased step length - right;Decreased step length - left Gait velocity: decreased   General Gait Details: slow and steady with RW, did need MinA for obstacle avoidance and management especially on L side. HR 123BPM max observed, recovered well with rest   Stairs             Wheelchair Mobility    Modified Rankin (Stroke Patients Only)       Balance Overall balance assessment: Needs assistance Sitting-balance support: Feet unsupported;No upper extremity supported Sitting balance-Leahy Scale: Good Sitting balance - Comments: S statically   Standing balance support: Bilateral upper extremity supported Standing balance-Leahy Scale: Poor Standing balance comment: reliant on BUE support and external assist                            Cognition Arousal/Alertness: Awake/alert Behavior During Therapy: WFL for tasks assessed/performed Overall Cognitive Status: History of cognitive impairments - at baseline                   Orientation Level: Disoriented to;Time;Situation Current Attention Level: Selective Memory: Decreased short-term memory;Decreased recall of precautions Following Commands: Follows one step commands with increased time;Follows one step commands consistently Safety/Judgement: Decreased awareness of safety;Decreased awareness of deficits Awareness: Intellectual Problem Solving: Slow processing;Decreased initiation;Difficulty sequencing;Requires verbal cues;Requires tactile cues General Comments: more alert and aware as compared to last session, able to follow simple commands but a bit tangential.  Often makes comments unrelated to topic at hand, but pleasant. Very poor  insight into deficits and safety. Unable to remember she had a stroke even after being reoriented      Exercises      General Comments        Pertinent Vitals/Pain Pain Assessment: No/denies pain Pain Score: 0-No pain Faces Pain Scale: No hurt Pain Intervention(s): Limited activity within patient's tolerance;Monitored during session    Home Living                      Prior Function            PT Goals (current goals can now be found in the care plan section) Acute Rehab PT Goals Patient Stated Goal: go to rehab PT Goal Formulation: With family Time For Goal Achievement: 12/13/20 Potential to Achieve Goals: Good Progress towards PT goals: Progressing toward goals    Frequency    Min 3X/week      PT Plan Current plan remains appropriate    Co-evaluation              AM-PAC PT "6 Clicks" Mobility   Outcome Measure  Help needed turning from your back to your side while in a flat bed without using bedrails?: A Little Help needed moving from lying on your back to sitting on the side of a flat bed without using bedrails?: A Little Help needed moving to and from a bed to a chair (including a wheelchair)?: A Little Help needed standing up from a chair using your arms (e.g., wheelchair or bedside chair)?: A Little Help needed to walk in hospital room?: A Little Help needed climbing 3-5 steps with a railing? : A Lot 6 Click Score: 17    End of Session   Activity Tolerance: Patient tolerated treatment well Patient left: in chair;with call bell/phone within reach;with chair alarm set Nurse Communication: Mobility status PT Visit Diagnosis: Unsteadiness on feet (R26.81);Muscle weakness (generalized) (M62.81);Difficulty in walking, not elsewhere classified (R26.2);Other symptoms and signs involving the nervous system (R29.898);History of falling (Z91.81)     Time: 0623-7628 PT Time Calculation (min) (ACUTE ONLY): 26 min  Charges:  $Gait Training: 8-22  mins $Therapeutic Activity: 8-22 mins                    Windell Norfolk, DPT, PN2   Supplemental Physical Therapist [place]    Pager [phone] Acute Rehab Office [phone]

## 2020-12-04 NOTE — Progress Notes (Signed)
PROGRESS NOTE                                                                                                                                                                                                             Patient Demographics:    Ann Johns, is a 87 y.o. female, DOB - 06-23-1933, ZOX:[id number]  Outpatient Primary MD for the patient is Lewis Moccasin, MD    LOS - 6  Admit date - 11/28/2020    Chief Complaint  Patient presents with   Fall   Altered Mental Status       Brief Narrative (HPI from H&P)   Ann Johns  is a 87 y.o. female, with history of intracerebral hemorrhage in 2019, seizures, early dementia, essential hypertension who lives at an assisted living facility was brought in by her son to the hospital for evaluation.  According to the son she was in today morning around 9 AM when she had a fall at assisted living facility, she subsequently did well however she was feeling weak the whole day, this morning she had another 4 at assisted living facility after which she was brought to the [facility] ER where she was found to have an acute stroke, UTI and a COVID-19 infection.  Neurology was consulted and I was requested to admit the patient to admit the patient   Subjective:   Patient denies any complaints today, she denies any new focal deficits, she was encouraged to use incentive spirometer, to get out of bed to chair .    Assessment  & Plan :     Left PCA  - CVA in the Subcortical left parietooccipital lobes and callosal splenium - causing x 2 falls, starting over 36 hrs before admission - she thankfully has mild deficits, Neuro following, ASA - Statin for now, Plavix x 3 weeks, PT - OT - SLP, will need SNF. Await bed.   UTI  - Rocephin x 3, negative cultures.   COVID infection.   -She has had 2 doses of vaccine so far, she did have a dry cough, moderate inflammatory markers finished short  course of Decadron IV and Remdesivir - resolved clinically.   Hypertension.  - Allow for permissive hypertension since > 3 days, low dose Lopressor added on 12/03/20   Dementia. -   At risk for delirium.  Continue Namenda at home dose, minimize benzodiazepines and narcotics, son warned about possible worsening delirium while in the hospital. -He was encouraged to drink Ensure today, as well she was encouraged to get out of bed to chair with staff assistance.   SpO2: 97 %  Recent Labs  Lab 11/28/20 1014 11/28/20 1051 11/28/20 1701 11/29/20 0219 11/30/20 0453 12/01/20 0359  WBC  --  10.0 8.0 7.4  --  10.4  HGB  --  11.9* 12.1 12.7  --  12.3  HCT  --  35.4* 38.8 39.1  --  35.6*  PLT  --  250 259 269  --  320  CRP  --   --  11.5* 13.5*  --  5.2*  BNP  --   --   --  60.9 268.0* 88.1  DDIMER  --   --  1.45* 1.15* 0.71* 0.54*  AST  --   --   --  25  --  22  ALT  --   --   --  15  --  16  ALKPHOS  --   --   --  75  --  62  BILITOT  --   --   --  1.3*  --  0.7  ALBUMIN  --   --   --  3.6  --  3.2*  SARSCOV2NAA POSITIVE*  --   --   --   --   --            Condition - Extremely Guarded  Family Communication  :  none at [place]   Code Status :  Full  Consults  :  Neuro  PUD Prophylaxis :    Procedures  :     MRI - Moderate-sized acute/early subacute left PCA territory infarct affecting the cortical/subcortical left parietooccipital lobes and callosal splenium. Redemonstrated chronic encephalomalacia and associated chronic blood products in the anterior frontal lobes, and anterior right temporal lobe, presumably posttraumatic in etiology. Chronic small-vessel ischemic changes which are severe in the cerebral white matter, and mild in the pons. Mild-to-moderate generalized cerebral atrophy. Comparatively mild cerebellar atrophy. Paranasal sinus disease  MRA - 1. No emergent large vessel occlusion or high-grade stenosis. 2. Mild narrowing of the right PCA P2 segment.    EEG -  non acute  Carotid US - stable.  TTE - 1. Left ventricular ejection fraction, by estimation, is 70 to 75%. The left ventricle has hyperdynamic function. The left ventricle has no regional wall motion abnormalities. There is moderate asymmetric left ventricular hypertrophy of the basal-septal  segment. Indeterminate diastolic filling due to E-A fusion.  2. Right ventricular systolic function is normal. The right ventricular size is normal.  3. The mitral valve is grossly normal. Mild mitral valve regurgitation. No evidence of mitral stenosis.  4. The aortic valve was not well visualized. There is mild calcification of the aortic valve. Aortic valve regurgitation is mild to moderate. No aortic stenosis is present      Disposition Plan  :    Status is: Inpatient - Remains inpatient appropriate because: CVA , Covid, placement  DVT Prophylaxis  :    heparin injection 5,000 Units Start: 11/28/20 1715    Lab Results  Component Value Date   PLT 320 12/01/2020    Diet :  Diet Order             DIET SOFT Room service appropriate? No; Fluid consistency: Honey Thick  Diet effective now  Inpatient Medications  Scheduled Meds:  aspirin  81 mg Oral Daily   atorvastatin  40 mg Oral Daily   cholecalciferol  75 mcg Oral Daily   clopidogrel  75 mg Oral Daily   dextromethorphan-guaiFENesin  1 tablet Oral BID   heparin  5,000 Units Subcutaneous Q8H   mouth rinse  15 mL Mouth Rinse BID   memantine  10 mg Oral BID   metoprolol tartrate  25 mg Oral BID   multivitamin with minerals  1 tablet Oral Daily   Continuous Infusions:   PRN Meds:.acetaminophen **OR** acetaminophen, bisacodyl, haloperidol lactate, ondansetron **OR** ondansetron (ZOFRAN) IV  Antibiotics  :    Anti-infectives (From admission, onward)    Start     Dose/Rate Route Frequency Ordered Stop   11/29/20 1000  remdesivir 100 mg in sodium chloride 0.9 % 100 mL IVPB  Status:  Discontinued       See  Hyperspace for full Linked Orders Report.   100 mg 200 mL/hr over 30 Minutes Intravenous Daily 11/28/20 1743 11/30/20 1242   11/28/20 1900  remdesivir 200 mg in sodium chloride 0.9% 250 mL IVPB       See Hyperspace for full Linked Orders Report.   200 mg 580 mL/hr over 30 Minutes Intravenous Once 11/28/20 1743 11/28/20 2232   11/28/20 1715  cefTRIAXone (ROCEPHIN) 1 g in sodium chloride 0.9 % 100 mL IVPB        1 g 200 mL/hr over 30 Minutes Intravenous Every 24 hours 11/28/20 1700 11/30/20 1625   11/28/20 1330  cephALEXin (KEFLEX) capsule 500 mg  Status:  Discontinued        500 mg Oral Every 8 hours 11/28/20 1328 11/28/20 1700         Daeton Kluth M.D on 12/04/2020 at 1:52 PM  To page go to [email or web address]   [facility] -  Office  [phone]  See all Orders from today for further details    Objective:   Vitals:   12/03/20 2350 12/04/20 0358 12/04/20 0827 12/04/20 1140  BP: 127/73 108/63 121/83 123/65  Pulse: 80 80 94 74  Resp: (!) 22 19 18 18   Temp: 98 F (36.7 C) 99.1 F (37.3 C) 98.5 F (36.9 C) 97.8 F (36.6 C)  TempSrc: Oral Oral Oral Oral  SpO2: 94% 96% 95% 97%  Weight:      Height:        Wt Readings from Last 3 Encounters:  11/29/20 51.2 kg  10/03/20 51.7 kg  04/02/20 54 kg     Intake/Output Summary (Last 24 hours) at 12/04/2020 1352 Last data filed at 12/03/2020 1752 Gross per 24 hour  Intake 436 ml  Output 300 ml  Net 136 ml     Physical Exam  Awake Alert, Oriented X 1, frail. Symmetrical Chest wall movement, Good air movement bilaterally, CTAB RRR,No Gallops,Rubs or new Murmurs, No Parasternal Heave +ve B.Sounds, Abd Soft, No tenderness, No rebound - guarding or rigidity. No Cyanosis, Clubbing or edema, No new Rash or bruise        Data Review:    CBC Recent Labs  Lab 11/28/20 1051 11/28/20 1701 11/29/20 0219 12/01/20 0359  WBC 10.0 8.0 7.4 10.4  HGB 11.9* 12.1 12.7 12.3  HCT 35.4* 38.8 39.1 35.6*  PLT 250 259  269 320  MCV 91.5 99.0 93.3 88.1  MCH 30.7 30.9 30.3 30.4  MCHC 33.6 31.2 32.5 34.6  RDW 12.4 12.3 12.4 12.6  LYMPHSABS 1.3  --  1.0 1.6  MONOABS 1.1*  --  0.2 0.6  EOSABS 0.0  --  0.0 0.0  BASOSABS 0.0  --  0.0 0.0    Recent Labs  Lab 11/28/20 1051 11/28/20 1701 11/29/20 0219 11/30/20 0453 12/01/20 0359  NA 131*  --  135  --  135  K 3.7  --  3.9  --  3.7  CL 98  --  103  --  103  CO2 22  --  17*  --  22  GLUCOSE 116*  --  184*  --  166*  BUN 9  --  7*  --  13  CREATININE 0.45 0.46 0.62  --  0.50  CALCIUM 8.8*  --  9.0  --  8.8*  AST  --   --  25  --  22  ALT  --   --  15  --  16  ALKPHOS  --   --  75  --  62  BILITOT  --   --  1.3*  --  0.7  ALBUMIN  --   --  3.6  --  3.2*  MG  --   --  1.8  --  1.9  CRP  --  11.5* 13.5*  --  5.2*  DDIMER  --  1.45* 1.15* 0.71* 0.54*  HGBA1C  --   --  5.7*  --   --   BNP  --   --  60.9 268.0* 88.1    ------------------------------------------------------------------------------------------------------------------ No results for input(s): CHOL, HDL, LDLCALC, TRIG, CHOLHDL, LDLDIRECT in the last 72 hours.   Lab Results  Component Value Date   HGBA1C 5.7 (H) 11/29/2020   ------------------------------------------------------------------------------------------------------------------ No results for input(s): TSH, T4TOTAL, T3FREE, THYROIDAB in the last 72 hours.  Invalid input(s): FREET3  Cardiac Enzymes No results for input(s): CKMB, TROPONINI, MYOGLOBIN in the last 168 hours.  Invalid input(s): CK ------------------------------------------------------------------------------------------------------------------    Component Value Date/Time   BNP 88.1 12/01/2020 0359     Radiology Reports DG Chest 1 View  Result Date: 11/28/2020 CLINICAL DATA:  Stroke.  Pre MRI metal screening EXAM: CHEST  1 VIEW; PELVIS - 1-2 VIEW; ABDOMEN - 1 VIEW COMPARISON:  None. FINDINGS: The heart and mediastinal contours are within normal limits.  Aortic calcification. 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 No focal consolidation. No pulmonary edema. No pleural effusion. No pneumothorax. Nonobstructive bowel gas pattern. No acute osseous abnormality. Multilevel degenerative changes of the thoracolumbar spine. No retained radiopaque foreign body. IMPRESSION: 1. No retained radiopaque foreign body. 2. No acute cardiopulmonary disease. 3. Nonobstructive bowel gas pattern. 4.  Aortic Atherosclerosis (ICD10-I70.0). Electronically Signed   By: Tish Frederickson M.D.   On: 11/28/2020 15:19   DG Pelvis 1-2 Views  Result Date: 11/28/2020 CLINICAL DATA:  Stroke.  Pre MRI metal screening EXAM: CHEST  1 VIEW; PELVIS - 1-2 VIEW; ABDOMEN - 1 VIEW COMPARISON:  None. FINDINGS: The heart and mediastinal contours are within normal limits. Aortic calcification. 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 No focal consolidation. No pulmonary edema. No pleural effusion. No pneumothorax. Nonobstructive bowel gas pattern. No acute osseous abnormality. Multilevel degenerative changes of the thoracolumbar spine. No retained radiopaque foreign body. IMPRESSION: 1. No retained radiopaque foreign body. 2. No acute cardiopulmonary disease. 3. Nonobstructive bowel gas pattern. 4.  Aortic Atherosclerosis (ICD10-I70.0). Electronically Signed   By: Tish Frederickson M.D.   On: 11/28/2020 15:19   DG Abd 1 View  Result Date: 11/28/2020 CLINICAL DATA:  Stroke.  Pre MRI metal screening EXAM:  CHEST  1 VIEW; PELVIS - 1-2 VIEW; ABDOMEN - 1 VIEW COMPARISON:  None. FINDINGS: The heart and mediastinal contours are within normal limits. Aortic calcification. 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 No focal consolidation. No pulmonary edema. No pleural effusion. No pneumothorax. Nonobstructive bowel gas pattern. No acute osseous abnormality. Multilevel degenerative changes of the thoracolumbar spine. No retained radiopaque foreign body. IMPRESSION: 1. No retained radiopaque  foreign body. 2. No acute cardiopulmonary disease. 3. Nonobstructive bowel gas pattern. 4.  Aortic Atherosclerosis (ICD10-I70.0). Electronically Signed   By: Tish Frederickson M.D.   On: 11/28/2020 15:19   CT HEAD WO CONTRAST ( )  Result Date: 11/28/2020 CLINICAL DATA:  Head trauma, minor (Age >= 65y) hx of dementia with repeated falls recently, hematoma near right eye, evaluate for ICH EXAM: CT HEAD WITHOUT CONTRAST TECHNIQUE: Contiguous axial images were obtained from the base of the skull through the vertex without intravenous contrast. COMPARISON:  MRI 04/16/2020, CT 12/24/2018 FINDINGS: Brain: New area of low attenuation in the anteroinferior left frontal lobe with loss of gray-white differentiation and developing encephalomalacia is new from prior and likely represents a late subacute or chronic left ACA territory infarction. Chronic right anterior frontal lobe infarct is unchanged. No intracranial hemorrhage. No extra-axial collection. Stable size and configuration of the ventricles. No mass lesion or mass effect. Extensive low-density changes within the periventricular and subcortical white matter compatible with chronic microvascular ischemic change. Mild diffuse cerebral volume loss. Vascular: Atherosclerotic calcifications involving the large vessels of the skull base. No unexpected hyperdense vessel. Skull: Normal. Negative for fracture or focal lesion. Sinuses/Orbits: Mucosal thickening and partial opacification of the right maxillary sinus. There is extensive mucosal thickening within the bilateral ethmoid air cells. Orbital structures within normal limits. Other: Right periorbital soft tissue swelling. IMPRESSION: 1. Low-attenuation changes in the left frontal lobe is new from prior and likely represents a late subacute to chronic left ACA territory infarction. Further evaluation with MRI of the brain is recommended. 2. No intracranial hemorrhage identified. 3. Chronic right anterior frontal lobe  infarct is unchanged. 4. Right periorbital soft tissue swelling. 5. Paranasal sinus disease as described. Electronically Signed   By: Duanne Guess D.O.   On: 11/28/2020 12:58   MR ANGIO HEAD WO CONTRAST  Result Date: 11/28/2020 CLINICAL DATA:  Acute neurologic deficit EXAM: MRA HEAD WITHOUT CONTRAST TECHNIQUE: Angiographic images of the Circle of Willis were acquired using MRA technique without intravenous contrast. COMPARISON:  No pertinent prior exam. FINDINGS: POSTERIOR CIRCULATION: --Vertebral arteries: Normal --Inferior cerebellar arteries: Normal. --Basilar artery: Normal. --Superior cerebellar arteries: Normal. --Posterior cerebral arteries: Mild narrowing of the right PCA P2 segment. Normal left. ANTERIOR CIRCULATION: --Intracranial internal carotid arteries: Normal. --Anterior cerebral arteries (ACA): Normal. --Middle cerebral arteries (MCA): Normal. ANATOMIC VARIANTS: None IMPRESSION: 1. No emergent large vessel occlusion or high-grade stenosis. 2. Mild narrowing of the right PCA P2 segment. Electronically Signed   By: Deatra Robinson M.D.   On: 11/28/2020 23:20   MR BRAIN WO CONTRAST  Result Date: 11/28/2020 CLINICAL DATA:  Stroke, follow-up; CT head with question of evolving or new subacute ACA infarct, patient with history of dementia and prior CVA, follow-up imaging. EXAM: MRI HEAD WITHOUT CONTRAST TECHNIQUE: Multiplanar, multiecho pulse sequences of the brain and surrounding structures were obtained without intravenous contrast. COMPARISON:  Prior head CT 11/28/2020.  Brain MRI 04/16/2020. FINDINGS: Brain: Mild to moderate generalized cerebral atrophy. Comparatively mild cerebellar atrophy. Moderate-sized acute/early subacute infarct within the cortical/subcortical left parietooccipital lobes and within the callosal  splenium (PCA vascular territory). Redemonstrated chronic foci of encephalomalacia within the anterior frontal lobes and anterior right temporal lobe, with associated chronic  blood products. Background advanced patchy and confluent T2 FLAIR hyperintense signal abnormality within the cerebral white matter, nonspecific but compatible with chronic small vessel ischemic disease. Mild chronic small vessel ischemic changes are also present within the pons. No evidence of an intracranial mass. No extra-axial fluid collection. No midline shift. Partially empty sella turcica. Vascular: No definite loss of expected proximal arterial flow voids. Skull and upper cervical spine: No focal suspicious marrow lesion. Incompletely assessed cervical spondylosis. Sinuses/Orbits: Visualized orbits show no acute finding. Mild-to-moderate mucosal thickening within the bilateral ethmoid and right maxillary sinuses. Superimposed small volume frothy secretions within the right maxillary sinus. Mild mucosal thickening also present within the bilateral sphenoid and left maxillary sinuses. Other: 13 mm left parietal scalp lesion, nonspecific but possibly reflecting a sebaceous cyst. IMPRESSION: Moderate-sized acute/early subacute left PCA territory infarct affecting the cortical/subcortical left parietooccipital lobes and callosal splenium. Redemonstrated chronic encephalomalacia and associated chronic blood products in the anterior frontal lobes, and anterior right temporal lobe, presumably posttraumatic in etiology. Chronic small-vessel ischemic changes which are severe in the cerebral white matter, and mild in the pons. Mild-to-moderate generalized cerebral atrophy. Comparatively mild cerebellar atrophy. Paranasal sinus disease, as described. Electronically Signed   By: Jackey Loge D.O.   On: 11/28/2020 16:02   DG Chest Port 1 View  Result Date: 11/29/2020 CLINICAL DATA:  Shortness of breath.  Altered mental status. EXAM: PORTABLE CHEST 1 VIEW COMPARISON:  11/28/2020 FINDINGS: The patient is rotated to the right with grossly unchanged cardiomediastinal silhouette. Eventration of the right hemidiaphragm is  again noted. No confluent airspace opacity, edema, sizable pleural effusion, or pneumothorax is identified. Thoracic levoscoliosis is noted. IMPRESSION: No active disease. Electronically Signed   By: Sebastian Ache M.D.   On: 11/29/2020 05:31   DG Swallowing Func-Speech Pathology  Result Date: 12/03/2020 Table formatting from the original result was not included. Objective Swallowing Evaluation: Type of Study: MBS-Modified Barium Swallow Study  Patient Details Name: Nasia Cannan MRN: [id number] Date of Birth: 10-31-1933 Today's Date: 12/03/2020 Time: SLP Start Time (ACUTE ONLY): 1516 -SLP Stop Time (ACUTE ONLY): 1540 SLP Time Calculation (min) (ACUTE ONLY): 24 min Past Medical History: Past Medical History: Diagnosis Date  Cerebrovascular disease 10/03/2020  ICH (intracerebral hemorrhage) (HCC) 11/01/2017  Right frontal  Memory change 10/03/2020  Seizures (HCC) 11/01/2017 Past Surgical History: Past Surgical History: Procedure Laterality Date  VAGINAL HYSTERECTOMY   HPI: 87 yo female who presented on 10/13, brought to hospital by her son from her ALF after weakness and multiple falls. 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 PMH ICH right frontal lobe 2019, seizures, dementia, HTN.  Subjective: Pt was pleasant and cooperative with son at bedside Assessment / Plan / Recommendation CHL IP CLINICAL IMPRESSIONS 12/03/2020 Clinical Impression Pt demonstrated moderate oropharyngeal dysphagia with aspiration present. Oral delays, decreased cohesion and reduced manipulation describe her oral phase. Pt's epiglottis did not fully invert during all swallows and timing of laryngeal closure was late intermittently leading to penetration to vocal cords (PAS 5) and aspiration with straw (PAS 8). Material also penetrated and aspirated during multiple swallows. Reduced lingual retraction led to vallecular residue and pyriform residue present with mild increase in volume after thicker consistencies. Given pt's  cognitive state, compensatory techniques were not attempted. Recommend continue soft texture and upgrade to honey thick liquids, multiple swallows, throat  clear intermittently, crush pills and full supervision. SLP Visit Diagnosis Dysphagia, oropharyngeal phase (R13.12) Attention and concentration deficit following -- Frontal lobe and executive function deficit following -- Impact on safety and function Moderate aspiration risk;Severe aspiration risk   CHL IP TREATMENT RECOMMENDATION 12/03/2020 Treatment Recommendations Therapy as outlined in treatment plan below   Prognosis 12/03/2020 Prognosis for Safe Diet Advancement Fair Barriers to Reach Goals Cognitive deficits Barriers/Prognosis Comment -- CHL IP DIET RECOMMENDATION 12/03/2020 SLP Diet Recommendations Honey thick liquids;Other (Comment) Liquid Administration via Cup Medication Administration Crushed with puree Compensations Minimize environmental distractions;Slow rate;Small sips/bites;Multiple dry swallows after each bite/sip;Clear throat intermittently Postural Changes Seated upright at 90 degrees   CHL IP OTHER RECOMMENDATIONS 12/03/2020 Recommended Consults -- Oral Care Recommendations Oral care BID Other Recommendations --   CHL IP FOLLOW UP RECOMMENDATIONS 12/03/2020 Follow up Recommendations Skilled Nursing facility   [facility] IP FREQUENCY AND DURATION 12/03/2020 Speech Therapy Frequency (ACUTE ONLY) min 2x/week Treatment Duration 2 weeks      CHL IP ORAL PHASE 12/03/2020 Oral Phase Impaired Oral - Pudding Teaspoon -- Oral - Pudding Cup -- Oral - Honey Teaspoon -- Oral - Honey Cup Delayed oral transit Oral - Nectar Teaspoon -- Oral - Nectar Cup Delayed oral transit Oral - Nectar Straw -- Oral - Thin Teaspoon -- Oral - Thin Cup Decreased bolus cohesion;Lingual/palatal residue Oral - Thin Straw Decreased bolus cohesion Oral - Puree Delayed oral transit Oral - Mech Soft Weak lingual manipulation Oral - Regular Impaired mastication Oral - Multi-Consistency --  Oral - Pill -- Oral Phase - Comment --  CHL IP PHARYNGEAL PHASE 12/03/2020 Pharyngeal Phase Impaired Pharyngeal- Pudding Teaspoon -- Pharyngeal -- Pharyngeal- Pudding Cup -- Pharyngeal -- Pharyngeal- Honey Teaspoon -- Pharyngeal -- Pharyngeal- Honey Cup -- Pharyngeal -- Pharyngeal- Nectar Teaspoon -- Pharyngeal -- Pharyngeal- Nectar Cup -- Pharyngeal -- Pharyngeal- Nectar Straw -- Pharyngeal -- Pharyngeal- Thin Teaspoon -- Pharyngeal -- Pharyngeal- Thin Cup Reduced epiglottic inversion;Pharyngeal residue - valleculae;Pharyngeal residue - pyriform;Penetration/Aspiration during swallow;Reduced airway/laryngeal closure Pharyngeal Material enters airway, remains ABOVE vocal cords then ejected out;Material enters airway, remains ABOVE vocal cords and not ejected out Pharyngeal- Thin Straw Penetration/Aspiration during swallow;Pharyngeal residue - valleculae;Pharyngeal residue - pyriform Pharyngeal Material enters airway, CONTACTS cords and not ejected out;Material enters airway, passes BELOW cords without attempt by patient to eject out (silent aspiration) Pharyngeal- Puree Pharyngeal residue - valleculae Pharyngeal -- Pharyngeal- Mechanical Soft Pharyngeal residue - valleculae Pharyngeal -- Pharyngeal- Regular Pharyngeal residue - valleculae Pharyngeal -- Pharyngeal- Multi-consistency -- Pharyngeal -- Pharyngeal- Pill -- Pharyngeal -- Pharyngeal Comment --  CHL IP CERVICAL ESOPHAGEAL PHASE 12/03/2020 Cervical Esophageal Phase WFL Pudding Teaspoon -- Pudding Cup -- Honey Teaspoon -- Honey Cup -- Nectar Teaspoon -- Nectar Cup -- Nectar Straw -- Thin Teaspoon -- Thin Cup -- Thin Straw -- Puree -- Mechanical Soft -- Regular -- Multi-consistency -- Pill -- Cervical Esophageal Comment -- Royce Macadamia 12/03/2020, 4:57 PM              EEG adult  Result Date: 11/29/2020 Charlsie Quest, MD     11/29/2020 12:33 PM Patient Name: Kynadi Dragos MRN: [id number] Epilepsy Attending: Charlsie Quest Referring  Physician/Provider: Janey Genta, NP Date: 11/29/2020 Duration: 25.17 mins Patient history: 87yo F with ams. EEG to evaluate for seizure Level of alertness: Awake, asleep AEDs during EEG study: None Technical aspects: This EEG study was done with scalp electrodes positioned according to the 10-20 International system of electrode placement. Electrical activity was acquired at a  sampling rate of 500Hz  and reviewed with a high frequency filter of 70Hz  and a low frequency filter of 1Hz . EEG data were recorded continuously and digitally stored. Description: The posterior dominant rhythm consists of 7.5 Hz activity of moderate voltage (25-35 uV) seen predominantly in posterior head regions, symmetric and reactive to eye opening and eye closing. Sleep was characterized by vertex waves, sleep spindles (12 to 14 Hz), maximal frontocentral region. EEG showed intermittent generalized 3 to 6 Hz theta-delta slowing. Hyperventilation and photic stimulation were not performed.   ABNORMALITY - Intermittent slow, generalized IMPRESSION: This study is suggestive of mild diffuse encephalopathy, nonspecific etiology. No seizures or epileptiform discharges were seen throughout the recording.   ECHOCARDIOGRAM COMPLETE  Result Date: 11/29/2020    ECHOCARDIOGRAM REPORT   Patient Name:   CHRISTINE MORTON Date of Exam: 11/29/2020 Medical Rec #:  12/01/2020     Height:       62.0 in Accession #:    [id number]    Weight:       114.0 lb Date of Birth:  07-Jul-1933     BSA:          1.505 m Patient Age:    87 years      BP:           157/82 mmHg Patient Gender: F             HR:           98 bpm. Exam Location:  Inpatient Procedure: 2D Echo, Cardiac Doppler, Color Doppler and Intracardiac            Opacification Agent Indications:    Stroke I63.9  History:        Patient has prior history of Echocardiogram examinations, most                 recent 11/12/2017.  Sonographer:    [id number] RDCS Referring Phys: 06/18/1933 [facility]  Sonographer Comments: Technically difficult study due to poor echo windows. IMPRESSIONS  1. Left ventricular ejection fraction, by estimation, is 70 to 75%. The left ventricle has hyperdynamic function. The left ventricle has no regional wall motion abnormalities. There is moderate asymmetric left ventricular hypertrophy of the basal-septal  segment. Indeterminate diastolic filling due to E-A fusion.  2. Right ventricular systolic function is normal. The right ventricular size is normal.  3. The mitral valve is grossly normal. Mild mitral valve regurgitation. No evidence of mitral stenosis.  4. The aortic valve was not well visualized. There is mild calcification of the aortic valve. Aortic valve regurgitation is mild to moderate. No aortic stenosis is present. Conclusion(s)/Recommendation(s): No intracardiac source of embolism detected on this transthoracic study. A transesophageal echocardiogram is recommended to exclude cardiac source of embolism if clinically indicated. FINDINGS  Left Ventricle: Left ventricular ejection fraction, by estimation, is 70 to 75%. The left ventricle has hyperdynamic function. The left ventricle has no regional wall motion abnormalities. Definity contrast agent was given IV to delineate the left ventricular endocardial borders. The left ventricular internal cavity size was normal in size. There is moderate asymmetric left ventricular hypertrophy of the basal-septal segment. Indeterminate diastolic filling due to E-A fusion. Right Ventricle: The right ventricular size is normal. No increase in right ventricular wall thickness. Right ventricular systolic function is normal. Left Atrium: Left atrial size was normal in size. Right Atrium: Right atrial size was normal in size. Pericardium: There is no evidence of pericardial effusion. Presence of  pericardial fat pad. Mitral Valve: The mitral valve is grossly normal. Mild mitral valve regurgitation. No evidence of mitral  valve stenosis. Tricuspid Valve: The tricuspid valve is normal in structure. Tricuspid valve regurgitation is mild . No evidence of tricuspid stenosis. Aortic Valve: The aortic valve was not well visualized. There is mild calcification of the aortic valve. Aortic valve regurgitation is mild to moderate. No aortic stenosis is present. Pulmonic Valve: The pulmonic valve was not well visualized. Pulmonic valve regurgitation is mild to moderate. No evidence of pulmonic stenosis. Aorta: The aortic root is normal in size and structure. Venous: The inferior vena cava was not well visualized. IAS/Shunts: The interatrial septum was not well visualized.  LEFT VENTRICLE PLAX 2D LVIDd:         3.40 cm   Diastology LVIDs:         2.90 cm   LV e' medial:    5.00 cm/s LV PW:         0.90 cm   LV E/e' medial:  12.0 LV IVS:        1.30 cm   LV e' lateral:   5.66 cm/s LVOT diam:     2.10 cm   LV E/e' lateral: 10.6 LV SV:         38 LV SV Index:   25 LVOT Area:     3.46 cm  RIGHT VENTRICLE RV S prime:     16.80 cm/s LEFT ATRIUM           Index LA diam:      2.70 cm 1.79 cm/m LA Vol (A4C): 14.5 ml 9.63 ml/m  AORTIC VALVE LVOT Vmax:   72.70 cm/s LVOT Vmean:  49.000 cm/s LVOT VTI:    0.109 m  AORTA Ao Root diam: 3.30 cm Ao Asc diam:  3.20 cm MITRAL VALVE                TRICUSPID VALVE MV Area (PHT): 5.46 cm     TR Peak grad:   23.2 mmHg MV Decel Time: 139 msec     TR Vmax:        241.00 cm/s MV E velocity: 59.75 cm/s MV A velocity: 104.00 cm/s  SHUNTS MV E/A ratio:  0.57         Systemic VTI:  0.11 m                             Systemic Diam: 2.10 cm Weston Brass MD Electronically signed by Weston Brass MD Signature Date/Time: 11/29/2020/6:57:24 PM    Final    VAS US CAROTID (at [facility] and [facility] only)  Result Date: 11/29/2020 Carotid Arterial Duplex Study Patient Name:  CYNTHEA ZACHMAN  Date of Exam:   11/29/2020 Medical Rec #: [id number]      Accession #:    [id number] Date of Birth: November 03, 1933      Patient Gender: F Patient Age:    4 years Exam Location:  [facility] [place] Procedure:      VAS US CAROTID Referring Phys: Terrilee Files [facility] --------------------------------------------------------------------------------  Indications:       CVA and recent falls, weakness, COVID+. Risk Factors:      Hypertension. Other Factors:     HX of ICH (2019). Comparison Study:  Previous exam 12/07/2017 1-39% BIL Performing Technologist: Ernestene Mention RVT, RDMS  Examination Guidelines: A complete evaluation includes B-mode imaging, spectral Doppler, color Doppler, and power Doppler as needed of all accessible  portions of each vessel. Bilateral testing is considered an integral part of a complete examination. Limited examinations for reoccurring indications may be performed as noted.  Right Carotid Findings: +----------+--------+--------+--------+------------------+------------------+           PSV cm/sEDV cm/sStenosisPlaque DescriptionComments           +----------+--------+--------+--------+------------------+------------------+ CCA Prox  61      9                                 intimal thickening +----------+--------+--------+--------+------------------+------------------+ CCA Distal62      14                                intimal thickening +----------+--------+--------+--------+------------------+------------------+ ICA Prox  35      10              calcific and focaltortuous           +----------+--------+--------+--------+------------------+------------------+ ICA Distal74      17                                tortuous           +----------+--------+--------+--------+------------------+------------------+ ECA       65      0               calcific                             +----------+--------+--------+--------+------------------+------------------+ +----------+--------+-------+----------------+-------------------+           PSV cm/sEDV cmsDescribe        Arm Pressure (mmHG)  +----------+--------+-------+----------------+-------------------+ [id number]             Multiphasic, WNL                    +----------+--------+-------+----------------+-------------------+ +---------+--------+--+--------+--+---------+ VertebralPSV cm/s58EDV cm/s11Antegrade +---------+--------+--+--------+--+---------+  Left Carotid Findings: +----------+--------+--------+--------+------------------+---------------------+           PSV cm/sEDV cm/sStenosisPlaque DescriptionComments              +----------+--------+--------+--------+------------------+---------------------+ CCA Prox  53      11                                                      +----------+--------+--------+--------+------------------+---------------------+ CCA Distal49      14                                intimal thickening    +----------+--------+--------+--------+------------------+---------------------+ ICA Prox  41      14              calcific          intimal thickening &                                                      tortuous              +----------+--------+--------+--------+------------------+---------------------+  ICA Distal101     27                                                      +----------+--------+--------+--------+------------------+---------------------+ ECA       40      0               calcific                                +----------+--------+--------+--------+------------------+---------------------+ +----------+--------+--------+----------------+-------------------+           PSV cm/sEDV cm/sDescribe        Arm Pressure (mmHG) +----------+--------+--------+----------------+-------------------+ Subclavian102             Multiphasic, WNL                    +----------+--------+--------+----------------+-------------------+ +---------+--------+--+--------+--+---------+ VertebralPSV cm/s52EDV cm/s11Antegrade  +---------+--------+--+--------+--+---------+   Summary: Right Carotid: Velocities in the right ICA are consistent with a 1-39% stenosis.                The extracranial vessels were near-normal with only minimal wall                thickening or plaque. Left Carotid: Velocities in the left ICA are consistent with a 1-39% stenosis.               The extracranial vessels were near-normal with only minimal wall               thickening or plaque. Vertebrals:  Bilateral vertebral arteries demonstrate antegrade flow. Subclavians: Normal flow hemodynamics were seen in bilateral subclavian              arteries. *See table(s) above for measurements and observations.  Electronically signed by Sherald Hess MD on 11/29/2020 at 4:10:26 PM.    Final

## 2020-12-05 NOTE — Progress Notes (Signed)
PROGRESS NOTE                                                                                                                                                                                                             Patient Demographics:    Ann Johns, is a 87 y.o. female, DOB - [birth year], YKD:[id number]  Outpatient Primary MD for the patient is Lewis Moccasin, MD    LOS - 7  Admit date - 11/28/2020    Chief Complaint  Patient presents with   Fall   Altered Mental Status       Brief Narrative (HPI from H&P)    - Ann Johns  is a 87 y.o. female, with history of intracerebral hemorrhage in 2019, seizures, early dementia, essential hypertension who lives at an assisted living facility was brought in by her son to the hospital for evaluation.  According to the son she was in today morning around 9 AM when she had a fall at assisted living facility, she subsequently did well however she was feeling weak the whole day, this morning she had another 4 at assisted living facility after which she was brought to the [facility] ER where she was found to have an acute stroke, UTI and a COVID-19 infection.  Neurology was consulted .   Subjective:   There is no significant events overnight, patient herself denies any complaints.   Assessment  & Plan :     Left PCA  - CVA in the Subcortical left parietooccipital lobes and callosal splenium - causing x 2 falls, starting over 36 hrs before admission - she thankfully has mild deficits, Neuro following, ASA - Statin for now, Plavix x 3 weeks, PT - OT - SLP, will need SNF. Await bed.   UTI  - Rocephin x 3, negative cultures.   COVID infection.   -She has had 2 doses of vaccine so far, she did have a dry cough, moderate inflammatory markers finished short course of Decadron IV and Remdesivir - resolved clinically.   Hypertension.  - Allow for permissive hypertension since > 3  days, low dose Lopressor added on 12/03/20   Dementia. -   At risk for delirium.  Continue Namenda at home dose, minimize benzodiazepines and narcotics, son warned about possible worsening delirium while in the hospital. -He was encouraged to drink  Ensure today, as well she was encouraged to get out of bed to chair with staff assistance.   SpO2: 97 %  Recent Labs  Lab 11/28/20 1701 11/29/20 0219 11/30/20 0453 12/01/20 0359  WBC 8.0 7.4  --  10.4  HGB 12.1 12.7  --  12.3  HCT 38.8 39.1  --  35.6*  PLT 259 269  --  320  CRP 11.5* 13.5*  --  5.2*  BNP  --  60.9 268.0* 88.1  DDIMER 1.45* 1.15* 0.71* 0.54*  AST  --  25  --  22  ALT  --  15  --  16  ALKPHOS  --  75  --  62  BILITOT  --  1.3*  --  0.7  ALBUMIN  --  3.6  --  3.2*           Condition - Extremely Guarded  Family Communication  :  none at [place]   Code Status :  Full  Consults  :  Neuro  PUD Prophylaxis :    Procedures  :     MRI - Moderate-sized acute/early subacute left PCA territory infarct affecting the cortical/subcortical left parietooccipital lobes and callosal splenium. Redemonstrated chronic encephalomalacia and associated chronic blood products in the anterior frontal lobes, and anterior right temporal lobe, presumably posttraumatic in etiology. Chronic small-vessel ischemic changes which are severe in the cerebral white matter, and mild in the pons. Mild-to-moderate generalized cerebral atrophy. Comparatively mild cerebellar atrophy. Paranasal sinus disease  MRA - 1. No emergent large vessel occlusion or high-grade stenosis. 2. Mild narrowing of the right PCA P2 segment.    EEG - non acute  Carotid US - stable.  TTE - 1. Left ventricular ejection fraction, by estimation, is 70 to 75%. The left ventricle has hyperdynamic function. The left ventricle has no regional wall motion abnormalities. There is moderate asymmetric left ventricular hypertrophy of the basal-septal  segment. Indeterminate  diastolic filling due to E-A fusion.  2. Right ventricular systolic function is normal. The right ventricular size is normal.  3. The mitral valve is grossly normal. Mild mitral valve regurgitation. No evidence of mitral stenosis.  4. The aortic valve was not well visualized. There is mild calcification of the aortic valve. Aortic valve regurgitation is mild to moderate. No aortic stenosis is present      Disposition Plan  :    Status is: Inpatient -patient is stable for this discharge, awaiting till she finished her COVID quarantine before placement at facility.  DVT Prophylaxis  :    heparin injection 5,000 Units Start: 11/28/20 1715    Lab Results  Component Value Date   PLT 320 12/01/2020    Diet :  Diet Order             DIET SOFT Room service appropriate? No; Fluid consistency: Honey Thick  Diet effective now                    Inpatient Medications  Scheduled Meds:  aspirin  81 mg Oral Daily   atorvastatin  40 mg Oral Daily   cholecalciferol  75 mcg Oral Daily   clopidogrel  75 mg Oral Daily   dextromethorphan-guaiFENesin  1 tablet Oral BID   feeding supplement  237 mL Oral TID BM   heparin  5,000 Units Subcutaneous Q8H   mouth rinse  15 mL Mouth Rinse BID   memantine  10 mg Oral BID   metoprolol tartrate  25 mg  Oral BID   multivitamin with minerals  1 tablet Oral Daily   Continuous Infusions:   PRN Meds:.acetaminophen **OR** acetaminophen, bisacodyl, haloperidol lactate, ondansetron **OR** ondansetron (ZOFRAN) IV  Antibiotics  :    Anti-infectives (From admission, onward)    Start     Dose/Rate Route Frequency Ordered Stop   11/29/20 1000  remdesivir 100 mg in sodium chloride 0.9 % 100 mL IVPB  Status:  Discontinued       See Hyperspace for full Linked Orders Report.   100 mg 200 mL/hr over 30 Minutes Intravenous Daily 11/28/20 1743 11/30/20 1242   11/28/20 1900  remdesivir 200 mg in sodium chloride 0.9% 250 mL IVPB       See Hyperspace for full  Linked Orders Report.   200 mg 580 mL/hr over 30 Minutes Intravenous Once 11/28/20 1743 11/28/20 2232   11/28/20 1715  cefTRIAXone (ROCEPHIN) 1 g in sodium chloride 0.9 % 100 mL IVPB        1 g 200 mL/hr over 30 Minutes Intravenous Every 24 hours 11/28/20 1700 11/30/20 1625   11/28/20 1330  cephALEXin (KEFLEX) capsule 500 mg  Status:  Discontinued        500 mg Oral Every 8 hours 11/28/20 1328 11/28/20 1700         Loyce Klasen M.D on 12/05/2020 at 10:55 AM  To page go to [email or web address]   [facility] -  Office  [phone]  See all Orders from today for further details    Objective:   Vitals:   12/04/20 2033 12/05/20 0028 12/05/20 0352 12/05/20 0930  BP: 137/78 126/80 136/73 130/70  Pulse: 87 71 74 97  Resp: Temp: 97.7 F (36.5 C) 97.8 F (36.6 C) 98.3 F (36.8 C) 98.5 F (36.9 C)  TempSrc: Oral Oral  Oral  SpO2: 96% 96% 96% 97%  Weight:      Height:        Wt Readings from Last 3 Encounters:  11/29/20 51.2 kg  10/03/20 51.7 kg  04/02/20 54 kg     Intake/Output Summary (Last 24 hours) at 12/05/2020 1055 Last data filed at 12/05/2020 1003 Gross per 24 hour  Intake 234 ml  Output --  Net 234 ml     Physical Exam  Awake Alert, Oriented X 1, frail. Symmetrical Chest wall movement, Good air movement bilaterally, CTAB RRR,No Gallops,Rubs or new Murmurs, No Parasternal Heave +ve B.Sounds, Abd Soft, No tenderness, No rebound - guarding or rigidity. No Cyanosis, Clubbing or edema, No new Rash or bruise         Data Review:    CBC Recent Labs  Lab 11/28/20 1701 11/29/20 0219 12/01/20 0359  WBC 8.0 7.4 10.4  HGB 12.1 12.7 12.3  HCT 38.8 39.1 35.6*  PLT 259 269 320  MCV 99.0 93.3 88.1  MCH 30.9 30.3 30.4  MCHC 31.2 32.5 34.6  RDW 12.3 12.4 12.6  LYMPHSABS  --  1.0 1.6  MONOABS  --  0.2 0.6  EOSABS  --  0.0 0.0  BASOSABS  --  0.0 0.0    Recent Labs  Lab 11/28/20 1701 11/29/20 0219 11/30/20 0453 12/01/20 0359   NA  --  135  --  135  K  --  3.9  --  3.7  CL  --  103  --  103  CO2  --  17*  --  22  GLUCOSE  --  184*  --  166*  BUN  --  7*  --  13  CREATININE 0.46 0.62  --  0.50  CALCIUM  --  9.0  --  8.8*  AST  --  25  --  22  ALT  --  15  --  16  ALKPHOS  --  75  --  62  BILITOT  --  1.3*  --  0.7  ALBUMIN  --  3.6  --  3.2*  MG  --  1.8  --  1.9  CRP 11.5* 13.5*  --  5.2*  DDIMER 1.45* 1.15* 0.71* 0.54*  HGBA1C  --  5.7*  --   --   BNP  --  60.9 268.0* 88.1    ------------------------------------------------------------------------------------------------------------------ No results for input(s): CHOL, HDL, LDLCALC, TRIG, CHOLHDL, LDLDIRECT in the last 72 hours.   Lab Results  Component Value Date   HGBA1C 5.7 (H) 11/29/2020   ------------------------------------------------------------------------------------------------------------------ No results for input(s): TSH, T4TOTAL, T3FREE, THYROIDAB in the last 72 hours.  Invalid input(s): FREET3  Cardiac Enzymes No results for input(s): CKMB, TROPONINI, MYOGLOBIN in the last 168 hours.  Invalid input(s): CK ------------------------------------------------------------------------------------------------------------------    Component Value Date/Time   BNP 88.1 12/01/2020 0359     Radiology Reports DG Chest 1 View  Result Date: 11/28/2020 CLINICAL DATA:  Stroke.  Pre MRI metal screening EXAM: CHEST  1 VIEW; PELVIS - 1-2 VIEW; ABDOMEN - 1 VIEW COMPARISON:  None. FINDINGS: The heart and mediastinal contours are within normal limits. Aortic calcification. 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 No focal consolidation. No pulmonary edema. No pleural effusion. No pneumothorax. Nonobstructive bowel gas pattern. No acute osseous abnormality. Multilevel degenerative changes of the thoracolumbar spine. No retained radiopaque foreign body. IMPRESSION: 1. No retained radiopaque foreign body. 2. No acute cardiopulmonary disease. 3.  Nonobstructive bowel gas pattern. 4.  Aortic Atherosclerosis (ICD10-I70.0). Electronically Signed   By: Tish Frederickson M.D.   On: 11/28/2020 15:19   DG Pelvis 1-2 Views  Result Date: 11/28/2020 CLINICAL DATA:  Stroke.  Pre MRI metal screening EXAM: CHEST  1 VIEW; PELVIS - 1-2 VIEW; ABDOMEN - 1 VIEW COMPARISON:  None. FINDINGS: The heart and mediastinal contours are within normal limits. Aortic calcification. 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 No focal consolidation. No pulmonary edema. No pleural effusion. No pneumothorax. Nonobstructive bowel gas pattern. No acute osseous abnormality. Multilevel degenerative changes of the thoracolumbar spine. No retained radiopaque foreign body. IMPRESSION: 1. No retained radiopaque foreign body. 2. No acute cardiopulmonary disease. 3. Nonobstructive bowel gas pattern. 4.  Aortic Atherosclerosis (ICD10-I70.0). Electronically Signed   By: Tish Frederickson M.D.   On: 11/28/2020 15:19   DG Abd 1 View  Result Date: 11/28/2020 CLINICAL DATA:  Stroke.  Pre MRI metal screening EXAM: CHEST  1 VIEW; PELVIS - 1-2 VIEW; ABDOMEN - 1 VIEW COMPARISON:  None. FINDINGS: The heart and mediastinal contours are within normal limits. Aortic calcification. 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 No focal consolidation. No pulmonary edema. No pleural effusion. No pneumothorax. Nonobstructive bowel gas pattern. No acute osseous abnormality. Multilevel degenerative changes of the thoracolumbar spine. No retained radiopaque foreign body. IMPRESSION: 1. No retained radiopaque foreign body. 2. No acute cardiopulmonary disease. 3. Nonobstructive bowel gas pattern. 4.  Aortic Atherosclerosis (ICD10-I70.0). Electronically Signed   By: Tish Frederickson M.D.   On: 11/28/2020 15:19   CT HEAD WO CONTRAST ( )  Result Date: 11/28/2020 CLINICAL DATA:  Head trauma, minor (Age >= 65y) hx of dementia with repeated falls recently, hematoma near right eye, evaluate for  ICH EXAM: CT HEAD  WITHOUT CONTRAST TECHNIQUE: Contiguous axial images were obtained from the base of the skull through the vertex without intravenous contrast. COMPARISON:  MRI 04/16/2020, CT 12/24/2018 FINDINGS: Brain: New area of low attenuation in the anteroinferior left frontal lobe with loss of gray-white differentiation and developing encephalomalacia is new from prior and likely represents a late subacute or chronic left ACA territory infarction. Chronic right anterior frontal lobe infarct is unchanged. No intracranial hemorrhage. No extra-axial collection. Stable size and configuration of the ventricles. No mass lesion or mass effect. Extensive low-density changes within the periventricular and subcortical white matter compatible with chronic microvascular ischemic change. Mild diffuse cerebral volume loss. Vascular: Atherosclerotic calcifications involving the large vessels of the skull base. No unexpected hyperdense vessel. Skull: Normal. Negative for fracture or focal lesion. Sinuses/Orbits: Mucosal thickening and partial opacification of the right maxillary sinus. There is extensive mucosal thickening within the bilateral ethmoid air cells. Orbital structures within normal limits. Other: Right periorbital soft tissue swelling. IMPRESSION: 1. Low-attenuation changes in the left frontal lobe is new from prior and likely represents a late subacute to chronic left ACA territory infarction. Further evaluation with MRI of the brain is recommended. 2. No intracranial hemorrhage identified. 3. Chronic right anterior frontal lobe infarct is unchanged. 4. Right periorbital soft tissue swelling. 5. Paranasal sinus disease as described. Electronically Signed   By: Duanne Guess D.O.   On: 11/28/2020 12:58   MR ANGIO HEAD WO CONTRAST  Result Date: 11/28/2020 CLINICAL DATA:  Acute neurologic deficit EXAM: MRA HEAD WITHOUT CONTRAST TECHNIQUE: Angiographic images of the Circle of Willis were acquired using MRA technique without  intravenous contrast. COMPARISON:  No pertinent prior exam. FINDINGS: POSTERIOR CIRCULATION: --Vertebral arteries: Normal --Inferior cerebellar arteries: Normal. --Basilar artery: Normal. --Superior cerebellar arteries: Normal. --Posterior cerebral arteries: Mild narrowing of the right PCA P2 segment. Normal left. ANTERIOR CIRCULATION: --Intracranial internal carotid arteries: Normal. --Anterior cerebral arteries (ACA): Normal. --Middle cerebral arteries (MCA): Normal. ANATOMIC VARIANTS: None IMPRESSION: 1. No emergent large vessel occlusion or high-grade stenosis. 2. Mild narrowing of the right PCA P2 segment. Electronically Signed   By: Deatra Robinson M.D.   On: 11/28/2020 23:20   MR BRAIN WO CONTRAST  Result Date: 11/28/2020 CLINICAL DATA:  Stroke, follow-up; CT head with question of evolving or new subacute ACA infarct, patient with history of dementia and prior CVA, follow-up imaging. EXAM: MRI HEAD WITHOUT CONTRAST TECHNIQUE: Multiplanar, multiecho pulse sequences of the brain and surrounding structures were obtained without intravenous contrast. COMPARISON:  Prior head CT 11/28/2020.  Brain MRI 04/16/2020. FINDINGS: Brain: Mild to moderate generalized cerebral atrophy. Comparatively mild cerebellar atrophy. Moderate-sized acute/early subacute infarct within the cortical/subcortical left parietooccipital lobes and within the callosal splenium (PCA vascular territory). Redemonstrated chronic foci of encephalomalacia within the anterior frontal lobes and anterior right temporal lobe, with associated chronic blood products. Background advanced patchy and confluent T2 FLAIR hyperintense signal abnormality within the cerebral white matter, nonspecific but compatible with chronic small vessel ischemic disease. Mild chronic small vessel ischemic changes are also present within the pons. No evidence of an intracranial mass. No extra-axial fluid collection. No midline shift. Partially empty sella turcica. Vascular:  No definite loss of expected proximal arterial flow voids. Skull and upper cervical spine: No focal suspicious marrow lesion. Incompletely assessed cervical spondylosis. Sinuses/Orbits: Visualized orbits show no acute finding. Mild-to-moderate mucosal thickening within the bilateral ethmoid and right maxillary sinuses. Superimposed small volume frothy secretions within the right maxillary sinus. Mild mucosal thickening  also present within the bilateral sphenoid and left maxillary sinuses. Other: 13 mm left parietal scalp lesion, nonspecific but possibly reflecting a sebaceous cyst. IMPRESSION: Moderate-sized acute/early subacute left PCA territory infarct affecting the cortical/subcortical left parietooccipital lobes and callosal splenium. Redemonstrated chronic encephalomalacia and associated chronic blood products in the anterior frontal lobes, and anterior right temporal lobe, presumably posttraumatic in etiology. Chronic small-vessel ischemic changes which are severe in the cerebral white matter, and mild in the pons. Mild-to-moderate generalized cerebral atrophy. Comparatively mild cerebellar atrophy. Paranasal sinus disease, as described. Electronically Signed   By: Jackey Loge D.O.   On: 11/28/2020 16:02   DG Chest Port 1 View  Result Date: 11/29/2020 CLINICAL DATA:  Shortness of breath.  Altered mental status. EXAM: PORTABLE CHEST 1 VIEW COMPARISON:  11/28/2020 FINDINGS: The patient is rotated to the right with grossly unchanged cardiomediastinal silhouette. Eventration of the right hemidiaphragm is again noted. No confluent airspace opacity, edema, sizable pleural effusion, or pneumothorax is identified. Thoracic levoscoliosis is noted. IMPRESSION: No active disease. Electronically Signed   By: Sebastian Ache M.D.   On: 11/29/2020 05:31   DG Swallowing Func-Speech Pathology  Result Date: 12/03/2020 Table formatting from the original result was not included. Objective Swallowing Evaluation: Type of  Study: MBS-Modified Barium Swallow Study  Patient Details Name: Lavida Patch MRN: [id number] Date of Birth: 1933-09-06 Today's Date: 12/03/2020 Time: SLP Start Time (ACUTE ONLY): 1516 -SLP Stop Time (ACUTE ONLY): 1540 SLP Time Calculation (min) (ACUTE ONLY): 24 min Past Medical History: Past Medical History: Diagnosis Date  Cerebrovascular disease 10/03/2020  ICH (intracerebral hemorrhage) (HCC) 11/01/2017  Right frontal  Memory change 10/03/2020  Seizures (HCC) 11/01/2017 Past Surgical History: Past Surgical History: Procedure Laterality Date  VAGINAL HYSTERECTOMY   HPI: 87 yo female who presented on 10/13, brought to hospital by her son from her ALF after weakness and multiple falls. 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 PMH ICH right frontal lobe 2019, seizures, dementia, HTN.  Subjective: Pt was pleasant and cooperative with son at bedside Assessment / Plan / Recommendation CHL IP CLINICAL IMPRESSIONS 12/03/2020 Clinical Impression Pt demonstrated moderate oropharyngeal dysphagia with aspiration present. Oral delays, decreased cohesion and reduced manipulation describe her oral phase. Pt's epiglottis did not fully invert during all swallows and timing of laryngeal closure was late intermittently leading to penetration to vocal cords (PAS 5) and aspiration with straw (PAS 8). Material also penetrated and aspirated during multiple swallows. Reduced lingual retraction led to vallecular residue and pyriform residue present with mild increase in volume after thicker consistencies. Given pt's cognitive state, compensatory techniques were not attempted. Recommend continue soft texture and upgrade to honey thick liquids, multiple swallows, throat clear intermittently, crush pills and full supervision. SLP Visit Diagnosis Dysphagia, oropharyngeal phase (R13.12) Attention and concentration deficit following -- Frontal lobe and executive function deficit following -- Impact on safety and function  Moderate aspiration risk;Severe aspiration risk   CHL IP TREATMENT RECOMMENDATION 12/03/2020 Treatment Recommendations Therapy as outlined in treatment plan below   Prognosis 12/03/2020 Prognosis for Safe Diet Advancement Fair Barriers to Reach Goals Cognitive deficits Barriers/Prognosis Comment -- CHL IP DIET RECOMMENDATION 12/03/2020 SLP Diet Recommendations Honey thick liquids;Other (Comment) Liquid Administration via Cup Medication Administration Crushed with puree Compensations Minimize environmental distractions;Slow rate;Small sips/bites;Multiple dry swallows after each bite/sip;Clear throat intermittently Postural Changes Seated upright at 90 degrees   CHL IP OTHER RECOMMENDATIONS 12/03/2020 Recommended Consults -- Oral Care Recommendations Oral care BID Other Recommendations --  CHL IP FOLLOW UP RECOMMENDATIONS 12/03/2020 Follow up Recommendations Skilled Nursing facility   [facility] IP FREQUENCY AND DURATION 12/03/2020 Speech Therapy Frequency (ACUTE ONLY) min 2x/week Treatment Duration 2 weeks      CHL IP ORAL PHASE 12/03/2020 Oral Phase Impaired Oral - Pudding Teaspoon -- Oral - Pudding Cup -- Oral - Honey Teaspoon -- Oral - Honey Cup Delayed oral transit Oral - Nectar Teaspoon -- Oral - Nectar Cup Delayed oral transit Oral - Nectar Straw -- Oral - Thin Teaspoon -- Oral - Thin Cup Decreased bolus cohesion;Lingual/palatal residue Oral - Thin Straw Decreased bolus cohesion Oral - Puree Delayed oral transit Oral - Mech Soft Weak lingual manipulation Oral - Regular Impaired mastication Oral - Multi-Consistency -- Oral - Pill -- Oral Phase - Comment --  CHL IP PHARYNGEAL PHASE 12/03/2020 Pharyngeal Phase Impaired Pharyngeal- Pudding Teaspoon -- Pharyngeal -- Pharyngeal- Pudding Cup -- Pharyngeal -- Pharyngeal- Honey Teaspoon -- Pharyngeal -- Pharyngeal- Honey Cup -- Pharyngeal -- Pharyngeal- Nectar Teaspoon -- Pharyngeal -- Pharyngeal- Nectar Cup -- Pharyngeal -- Pharyngeal- Nectar Straw -- Pharyngeal --  Pharyngeal- Thin Teaspoon -- Pharyngeal -- Pharyngeal- Thin Cup Reduced epiglottic inversion;Pharyngeal residue - valleculae;Pharyngeal residue - pyriform;Penetration/Aspiration during swallow;Reduced airway/laryngeal closure Pharyngeal Material enters airway, remains ABOVE vocal cords then ejected out;Material enters airway, remains ABOVE vocal cords and not ejected out Pharyngeal- Thin Straw Penetration/Aspiration during swallow;Pharyngeal residue - valleculae;Pharyngeal residue - pyriform Pharyngeal Material enters airway, CONTACTS cords and not ejected out;Material enters airway, passes BELOW cords without attempt by patient to eject out (silent aspiration) Pharyngeal- Puree Pharyngeal residue - valleculae Pharyngeal -- Pharyngeal- Mechanical Soft Pharyngeal residue - valleculae Pharyngeal -- Pharyngeal- Regular Pharyngeal residue - valleculae Pharyngeal -- Pharyngeal- Multi-consistency -- Pharyngeal -- Pharyngeal- Pill -- Pharyngeal -- Pharyngeal Comment --  CHL IP CERVICAL ESOPHAGEAL PHASE 12/03/2020 Cervical Esophageal Phase WFL Pudding Teaspoon -- Pudding Cup -- Honey Teaspoon -- Honey Cup -- Nectar Teaspoon -- Nectar Cup -- Nectar Straw -- Thin Teaspoon -- Thin Cup -- Thin Straw -- Puree -- Mechanical Soft -- Regular -- Multi-consistency -- Pill -- Cervical Esophageal Comment -- Royce Macadamia 12/03/2020, 4:57 PM              EEG adult  Result Date: 11/29/2020 Charlsie Quest, MD     11/29/2020 12:33 PM Patient Name: Hilarie Sinha MRN: [id number] Epilepsy Attending: Charlsie Quest Referring Physician/Provider: Janey Genta, NP Date: 11/29/2020 Duration: 25.17 mins Patient history: 87yo F with ams. EEG to evaluate for seizure Level of alertness: Awake, asleep AEDs during EEG study: None Technical aspects: This EEG study was done with scalp electrodes positioned according to the 10-20 International system of electrode placement. Electrical activity was acquired at a sampling rate of  500Hz  and reviewed with a high frequency filter of 70Hz  and a low frequency filter of 1Hz . EEG data were recorded continuously and digitally stored. Description: The posterior dominant rhythm consists of 7.5 Hz activity of moderate voltage (25-35 uV) seen predominantly in posterior head regions, symmetric and reactive to eye opening and eye closing. Sleep was characterized by vertex waves, sleep spindles (12 to 14 Hz), maximal frontocentral region. EEG showed intermittent generalized 3 to 6 Hz theta-delta slowing. Hyperventilation and photic stimulation were not performed.   ABNORMALITY - Intermittent slow, generalized IMPRESSION: This study is suggestive of mild diffuse encephalopathy, nonspecific etiology. No seizures or epileptiform discharges were seen throughout the recording.   ECHOCARDIOGRAM COMPLETE  Result Date: 11/29/2020    ECHOCARDIOGRAM REPORT  Patient Name:   CHALICE PHILBERT Date of Exam: 11/29/2020 Medical Rec #:  [id number]     Height:       62.0 in Accession #:    [id number]    Weight:       114.0 lb Date of Birth:  1933/04/09     BSA:          1.505 m Patient Age:    87 years      BP:           157/82 mmHg Patient Gender: F             HR:           98 bpm. Exam Location:  Inpatient Procedure: 2D Echo, Cardiac Doppler, Color Doppler and Intracardiac            Opacification Agent Indications:    Stroke I63.9  History:        Patient has prior history of Echocardiogram examinations, most                 recent 11/12/2017.  Sonographer:    Leta Jungling RDCS Referring Phys: Heide Scales [facility]  Sonographer Comments: Technically difficult study due to poor echo windows. IMPRESSIONS  1. Left ventricular ejection fraction, by estimation, is 70 to 75%. The left ventricle has hyperdynamic function. The left ventricle has no regional wall motion abnormalities. There is moderate asymmetric left ventricular hypertrophy of the basal-septal  segment. Indeterminate diastolic filling due to  E-A fusion.  2. Right ventricular systolic function is normal. The right ventricular size is normal.  3. The mitral valve is grossly normal. Mild mitral valve regurgitation. No evidence of mitral stenosis.  4. The aortic valve was not well visualized. There is mild calcification of the aortic valve. Aortic valve regurgitation is mild to moderate. No aortic stenosis is present. Conclusion(s)/Recommendation(s): No intracardiac source of embolism detected on this transthoracic study. A transesophageal echocardiogram is recommended to exclude cardiac source of embolism if clinically indicated. FINDINGS  Left Ventricle: Left ventricular ejection fraction, by estimation, is 70 to 75%. The left ventricle has hyperdynamic function. The left ventricle has no regional wall motion abnormalities. Definity contrast agent was given IV to delineate the left ventricular endocardial borders. The left ventricular internal cavity size was normal in size. There is moderate asymmetric left ventricular hypertrophy of the basal-septal segment. Indeterminate diastolic filling due to E-A fusion. Right Ventricle: The right ventricular size is normal. No increase in right ventricular wall thickness. Right ventricular systolic function is normal. Left Atrium: Left atrial size was normal in size. Right Atrium: Right atrial size was normal in size. Pericardium: There is no evidence of pericardial effusion. Presence of pericardial fat pad. Mitral Valve: The mitral valve is grossly normal. Mild mitral valve regurgitation. No evidence of mitral valve stenosis. Tricuspid Valve: The tricuspid valve is normal in structure. Tricuspid valve regurgitation is mild . No evidence of tricuspid stenosis. Aortic Valve: The aortic valve was not well visualized. There is mild calcification of the aortic valve. Aortic valve regurgitation is mild to moderate. No aortic stenosis is present. Pulmonic Valve: The pulmonic valve was not well visualized. Pulmonic valve  regurgitation is mild to moderate. No evidence of pulmonic stenosis. Aorta: The aortic root is normal in size and structure. Venous: The inferior vena cava was not well visualized. IAS/Shunts: The interatrial septum was not well visualized.  LEFT VENTRICLE PLAX 2D LVIDd:         3.40 cm  Diastology LVIDs:         2.90 cm   LV e' medial:    5.00 cm/s LV PW:         0.90 cm   LV E/e' medial:  12.0 LV IVS:        1.30 cm   LV e' lateral:   5.66 cm/s LVOT diam:     2.10 cm   LV E/e' lateral: 10.6 LV SV:         38 LV SV Index:   25 LVOT Area:     3.46 cm  RIGHT VENTRICLE RV S prime:     16.80 cm/s LEFT ATRIUM           Index LA diam:      2.70 cm 1.79 cm/m LA Vol (A4C): 14.5 ml 9.63 ml/m  AORTIC VALVE LVOT Vmax:   72.70 cm/s LVOT Vmean:  49.000 cm/s LVOT VTI:    0.109 m  AORTA Ao Root diam: 3.30 cm Ao Asc diam:  3.20 cm MITRAL VALVE                TRICUSPID VALVE MV Area (PHT): 5.46 cm     TR Peak grad:   23.2 mmHg MV Decel Time: 139 msec     TR Vmax:        241.00 cm/s MV E velocity: 59.75 cm/s MV A velocity: 104.00 cm/s  SHUNTS MV E/A ratio:  0.57         Systemic VTI:  0.11 m                             Systemic Diam: 2.10 cm Weston Brass MD Electronically signed by Weston Brass MD Signature Date/Time: 11/29/2020/6:57:24 PM    Final    VAS US CAROTID (at [facility] and [facility] only)  Result Date: 11/29/2020 Carotid Arterial Duplex Study Patient Name:  DALYLAH RAMEY  Date of Exam:   11/29/2020 Medical Rec #: [id number]      Accession #:    [id number] Date of Birth: January 14, 1934      Patient Gender: F Patient Age:   52 years Exam Location:  [facility] Procedure:      VAS US CAROTID Referring Phys: Terrilee Files [facility] --------------------------------------------------------------------------------  Indications:       CVA and recent falls, weakness, COVID+. Risk Factors:      Hypertension. Other Factors:     HX of ICH (2019). Comparison Study:  Previous exam 12/07/2017 1-39% BIL Performing Technologist: Ernestene Mention  RVT, RDMS  Examination Guidelines: A complete evaluation includes B-mode imaging, spectral Doppler, color Doppler, and power Doppler as needed of all accessible portions of each vessel. Bilateral testing is considered an integral part of a complete examination. Limited examinations for reoccurring indications may be performed as noted.  Right Carotid Findings: +----------+--------+--------+--------+------------------+------------------+           PSV cm/sEDV cm/sStenosisPlaque DescriptionComments           +----------+--------+--------+--------+------------------+------------------+ CCA Prox  61      9                                 intimal thickening +----------+--------+--------+--------+------------------+------------------+ CCA Distal62      14  intimal thickening +----------+--------+--------+--------+------------------+------------------+ ICA Prox  35      10              calcific and focaltortuous           +----------+--------+--------+--------+------------------+------------------+ ICA Distal74      17                                tortuous           +----------+--------+--------+--------+------------------+------------------+ ECA       65      0               calcific                             +----------+--------+--------+--------+------------------+------------------+ +----------+--------+-------+----------------+-------------------+           PSV cm/sEDV cmsDescribe        Arm Pressure (mmHG) +----------+--------+-------+----------------+-------------------+ [id number]             Multiphasic, WNL                    +----------+--------+-------+----------------+-------------------+ +---------+--------+--+--------+--+---------+ VertebralPSV cm/s58EDV cm/s11Antegrade +---------+--------+--+--------+--+---------+  Left Carotid Findings:  +----------+--------+--------+--------+------------------+---------------------+           PSV cm/sEDV cm/sStenosisPlaque DescriptionComments              +----------+--------+--------+--------+------------------+---------------------+ CCA Prox  53      11                                                      +----------+--------+--------+--------+------------------+---------------------+ CCA Distal49      14                                intimal thickening    +----------+--------+--------+--------+------------------+---------------------+ ICA Prox  41      14              calcific          intimal thickening &                                                      tortuous              +----------+--------+--------+--------+------------------+---------------------+ ICA Distal101     27                                                      +----------+--------+--------+--------+------------------+---------------------+ ECA       40      0               calcific                                +----------+--------+--------+--------+------------------+---------------------+ +----------+--------+--------+----------------+-------------------+           PSV cm/sEDV cm/sDescribe  Arm Pressure (mmHG) +----------+--------+--------+----------------+-------------------+ Subclavian102             Multiphasic, WNL                    +----------+--------+--------+----------------+-------------------+ +---------+--------+--+--------+--+---------+ VertebralPSV cm/s52EDV cm/s11Antegrade +---------+--------+--+--------+--+---------+   Summary: Right Carotid: Velocities in the right ICA are consistent with a 1-39% stenosis.                The extracranial vessels were near-normal with only minimal wall                thickening or plaque. Left Carotid: Velocities in the left ICA are consistent with a 1-39% stenosis.               The extracranial vessels were  near-normal with only minimal wall               thickening or plaque. Vertebrals:  Bilateral vertebral arteries demonstrate antegrade flow. Subclavians: Normal flow hemodynamics were seen in bilateral subclavian              arteries. *See table(s) above for measurements and observations.  Electronically signed by Sherald Hess MD on 11/29/2020 at 4:10:26 PM.    Final

## 2020-12-05 NOTE — Progress Notes (Signed)
Occupational Therapy Treatment Patient Details Name: Ann Johns MRN: [id number] DOB: 1933-05-04 Today's Date: 12/05/2020   History of present illness 87yo female who presented on 10/13, brought to hospital by her son from her ALF after weakness and multiple falls. 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 PMH ICH right frontal lobe 2019, seizures, dementia, HTN   OT comments  Pt making progress with OT goals. This session she completed several basic ADL's, all at a min guard level for safety. Pt requiring verbal cuing for safety and sequencing due to inattention and mild confusion from baseline dementia. Pt very agreeable to ambulate and get cleaned up. OT will follow up acutely.    Recommendations for follow up therapy are one component of a multi-disciplinary discharge planning process, led by the attending physician.  Recommendations may be updated based on patient status, additional functional criteria and insurance authorization.    Follow Up Recommendations  SNF    Equipment Recommendations  None recommended by OT    Recommendations for Other Services      Precautions / Restrictions Precautions Precautions: Fall;Other (comment) Precaution Comments: acute PCA territory CVA, hx R frontal ICH, watch HR Restrictions Weight Bearing Restrictions: No       Mobility Bed Mobility               General bed mobility comments: up in recliner    Transfers Overall transfer level: Needs assistance Equipment used: Rolling walker (2 wheeled) Transfers: Sit to/from Stand Sit to Stand: Min guard         General transfer comment: min guard for boosting to standing, extra time/effort and cues for hand placement    Balance Overall balance assessment: Needs assistance Sitting-balance support: Feet unsupported;No upper extremity supported Sitting balance-Leahy Scale: Good     Standing balance support: Bilateral upper extremity supported Standing  balance-Leahy Scale: Poor Standing balance comment: reliant on BUE support and external assist                           ADL either performed or assessed with clinical judgement   ADL Overall ADL's : Needs assistance/impaired     Grooming: Min guard;Standing;Oral care;Wash/dry Johns;Wash/dry hands;Brushing hair Grooming Details (indicate cue type and reason): Min Guard A for safety in standing while at the sink. Cues for initation and sequencing - baseline dementia         Upper Body Dressing : Set up;Sitting Upper Body Dressing Details (indicate cue type and reason): doffed and donned hospital gown     Toilet Transfer: Min guard;Ambulation Toilet Transfer Details (indicate cue type and reason): completed in bathroom, min guard for safety Toileting- Clothing Manipulation and Hygiene: Sitting/lateral lean;Min guard Toileting - Clothing Manipulation Details (indicate cue type and reason): No physical assist needed, min guard for safey       General ADL Comments: Pt presenting with increased mobility/stability this session, able to ocpmlete basic ADL's with min guard for safeyt     Vision       Perception     Praxis      Cognition Arousal/Alertness: Awake/alert Behavior During Therapy: WFL for tasks assessed/performed Overall Cognitive Status: History of cognitive impairments - at baseline                                 General Comments: more alert and aware as compared to  last session, able to follow simple commands but a bit tangential.  Often makes comments unrelated to topic at hand, but pleasant. Very poor insight into deficits and safety. Unable to remember she had a stroke even after being reoriented        Exercises     Shoulder Instructions       General Comments VSS on RA, congested cough, RN aware    Pertinent Vitals/ Pain       Pain Assessment: No/denies pain  Home Living                                           Prior Functioning/Environment              Frequency  Min 2X/week        Progress Toward Goals  OT Goals(current goals can now be found in the care plan section)  Progress towards OT goals: Progressing toward goals  Acute Rehab OT Goals Patient Stated Goal: To go back home OT Goal Formulation: With patient Time For Goal Achievement: 12/14/20 Potential to Achieve Goals: Good ADL Goals Pt Will Perform Grooming: with modified independence;standing Pt Will Perform Lower Body Bathing: with supervision;sitting/lateral leans;sit to/from stand Pt Will Perform Lower Body Dressing: with supervision;sitting/lateral leans;sit to/from stand Pt Will Transfer to Toilet: with supervision;ambulating Additional ADL Goal #1: Pt will maintain dynamic standing balance at the sink using 1 UE for support to complete grooming tasks.  Plan Discharge plan remains appropriate    Co-evaluation                 AM-PAC OT "6 Clicks" Daily Activity     Outcome Measure   Help from another person eating meals?: A Little Help from another person taking care of personal grooming?: A Little Help from another person toileting, which includes using toliet, bedpan, or urinal?: A Little Help from another person bathing (including washing, rinsing, drying)?: A Lot Help from another person to put on and taking off regular upper body clothing?: A Little Help from another person to put on and taking off regular lower body clothing?: A Little 6 Click Score: 17    End of Session Equipment Utilized During Treatment: Gait belt;Rolling walker  OT Visit Diagnosis: Unsteadiness on feet (R26.81);Other abnormalities of gait and mobility (R26.89);Muscle weakness (generalized) (M62.81);Repeated falls (R29.6)   Activity Tolerance Patient tolerated treatment well   Patient Left in chair;with call bell/phone within reach;with chair alarm set   Nurse Communication Mobility status        Time:  9574-7340 OT Time Calculation (min): 28 min  Charges: OT General Charges $OT Visit: 1 Visit OT Treatments $Self Care/Home Management : 23-37 mins  Tae Robak H., OTR/L Acute Rehabilitation  Ann Johns Ann Johns Ann Johns 12/05/2020, 6:00 PM

## 2020-12-06 LAB — CBC
HCT: 36.9 % (ref 36.0–46.0)
Hemoglobin: 12.2 g/dL (ref 12.0–15.0)
MCH: 30.5 pg (ref 26.0–34.0)
MCHC: 33.1 g/dL (ref 30.0–36.0)
MCV: 92.3 fL (ref 80.0–100.0)
Platelets: 370 10*3/uL (ref 150–400)
RBC: 4 MIL/uL (ref 3.87–5.11)
RDW: 12.6 % (ref 11.5–15.5)
WBC: 15.5 10*3/uL — ABNORMAL HIGH (ref 4.0–10.5)
nRBC: 0 % (ref 0.0–0.2)

## 2020-12-06 LAB — BASIC METABOLIC PANEL
Anion gap: 9 (ref 5–15)
BUN: 16 mg/dL (ref 8–23)
CO2: 26 mmol/L (ref 22–32)
Calcium: 9 mg/dL (ref 8.9–10.3)
Chloride: 103 mmol/L (ref 98–111)
Creatinine, Ser: 0.58 mg/dL (ref 0.44–1.00)
GFR, Estimated: >60 mL/min (ref >60)
Glucose, Bld: 128 mg/dL — ABNORMAL HIGH (ref 70–99)
Potassium: 4.1 mmol/L (ref 3.5–5.1)
Sodium: 138 mmol/L (ref 135–145)

## 2020-12-06 NOTE — Progress Notes (Signed)
Physical Therapy Treatment Patient Details Name: Ann Johns MRN: [id number] DOB: 1933/08/16 Today's Date: 12/06/2020   History of Present Illness 87yo female who presented on 10/13, brought to hospital by her son from her ALF after weakness and multiple falls. 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 PMH ICH right frontal lobe 2019, seizures, dementia, HTN    PT Comments    Patient received in recliner, pleasant and cooperative with therapy. Able to progress gait distance today but did need one sitting rest break. VSS on RA this morning, HR WNL with activity. Very tangential and with poor awareness of safety/deficits, needed frequent cues for sequencing and to minimize fall risk. Left up in recliner with all needs met, chair alarm active.   Progressing physically but would still benefit from SNF to improve endurance and reduce fall risk. To be able to return home, would really need 24/7 physical assist/supervision to maintain safety/prevent falls, which would likely not be guaranteed in her ILF.      Recommendations for follow up therapy are one component of a multi-disciplinary discharge planning process, led by the attending physician.  Recommendations may be updated based on patient status, additional functional criteria and insurance authorization.  Follow Up Recommendations  SNF;Supervision/Assistance - 24 hour     Equipment Recommendations  Rolling walker with 5" wheels;3in1 (PT);Wheelchair (measurements PT);Wheelchair cushion (measurements PT)    Recommendations for Other Services       Precautions / Restrictions Precautions Precautions: Fall;Other (comment) Precaution Comments: acute PCA territory CVA, hx R frontal ICH, watch HR Restrictions Weight Bearing Restrictions: No     Mobility  Bed Mobility               General bed mobility comments: up in recliner upon entry    Transfers Overall transfer level: Needs assistance Equipment  used: Rolling walker (2 wheeled) Transfers: Sit to/from Stand Sit to Stand: Min guard         General transfer comment: min guard for boosting to standing, extra time/effort and cues for hand placement  Ambulation/Gait Ambulation/Gait assistance: Min guard Gait Distance (Feet): 80 Feet (89fx2) Assistive device: Rolling walker (2 wheeled) Gait Pattern/deviations: Step-through pattern;Trunk flexed;Decreased step length - right;Decreased step length - left Gait velocity: decreased   General Gait Details: slow and steady with RW, needed frequent cues for safe proximity to device but did better with obstacle avoidance today. HR WNL, just easily fatigued.   Stairs             Wheelchair Mobility    Modified Rankin (Stroke Patients Only)       Balance Overall balance assessment: Needs assistance Sitting-balance support: Feet unsupported;No upper extremity supported Sitting balance-Leahy Scale: Good Sitting balance - Comments: able to don/doff socks without difficulty sitting unsupported   Standing balance support: Bilateral upper extremity supported Standing balance-Leahy Scale: Fair Standing balance comment: benefits from BUE support dynamically                            Cognition Arousal/Alertness: Awake/alert Behavior During Therapy: WFL for tasks assessed/performed Overall Cognitive Status: History of cognitive impairments - at baseline                   Orientation Level: Disoriented to;Situation;Time Current Attention Level: Selective Memory: Decreased short-term memory;Decreased recall of precautions Following Commands: Follows one step commands with increased time;Follows one step commands consistently Safety/Judgement: Decreased awareness of safety;Decreased awareness  of deficits Awareness: Intellectual Problem Solving: Slow processing;Decreased initiation;Difficulty sequencing;Requires verbal cues;Requires tactile cues General Comments:  alert and aware, still pleasantly confused but a bit tangential. At one point put tissue paper in her mouth, when asked what she was doing she said "sopping up all my spit". Still with poor memory, also poor insights into safety and deficits.      Exercises      General Comments General comments (skin integrity, edema, etc.): VSS on RA, HR WNL with activity      Pertinent Vitals/Pain Pain Assessment: Faces Faces Pain Scale: No hurt Pain Intervention(s): Limited activity within patient's tolerance;Monitored during session    Home Living                      Prior Function            PT Goals (current goals can now be found in the care plan section) Acute Rehab PT Goals Patient Stated Goal: To go back home PT Goal Formulation: With family Time For Goal Achievement: 12/13/20 Potential to Achieve Goals: Good Progress towards PT goals: Progressing toward goals    Frequency    Min 3X/week      PT Plan Current plan remains appropriate    Co-evaluation              AM-PAC PT "6 Clicks" Mobility   Outcome Measure  Help needed turning from your back to your side while in a flat bed without using bedrails?: A Little Help needed moving from lying on your back to sitting on the side of a flat bed without using bedrails?: A Little Help needed moving to and from a bed to a chair (including a wheelchair)?: A Little Help needed standing up from a chair using your arms (e.g., wheelchair or bedside chair)?: A Little Help needed to walk in hospital room?: A Little Help needed climbing 3-5 steps with a railing? : A Little 6 Click Score: 18    End of Session   Activity Tolerance: Patient tolerated treatment well Patient left: in chair;with call bell/phone within reach;with chair alarm set Nurse Communication: Mobility status PT Visit Diagnosis: Unsteadiness on feet (R26.81);Muscle weakness (generalized) (M62.81);Difficulty in walking, not elsewhere classified  (R26.2);Other symptoms and signs involving the nervous system (R29.898);History of falling (Z91.81)     Time: 3267-1245 PT Time Calculation (min) (ACUTE ONLY): 28 min  Charges:  $Gait Training: 8-22 mins $Therapeutic Activity: 8-22 mins                    Windell Norfolk, DPT, PN2   Supplemental Physical Therapist [place]    Pager [phone] Acute Rehab Office [phone]

## 2020-12-06 NOTE — Progress Notes (Signed)
PROGRESS NOTE                                                                                                                                                                                                             Patient Demographics:    Ann Johns, is a 87 y.o. female, DOB - [birth year], UUE:[id number]  Outpatient Primary MD for the patient is Lewis Moccasin, MD    LOS - 8  Admit date - 11/28/2020    Chief Complaint  Patient presents with   Fall   Altered Mental Status       Brief Narrative (HPI from H&P)     - Ann Johns  is a 87 y.o. female, with history of intracerebral hemorrhage in 2019, seizures, early dementia, essential hypertension who lives at an assisted living facility was brought in by her son to the hospital for evaluation.  According to the son she was in today morning around 9 AM when she had a fall at assisted living facility, she subsequently did well however she was feeling weak the whole day, this morning she had another 4 at assisted living facility after which she was brought to the [facility] ER where she was found to have an acute stroke, UTI and a COVID-19 infection.  Neurology was consulted .   Subjective:   Patient herself denies any complaints, there is no significant events overnight as discussed with staff.   Assessment  & Plan :     Left PCA  - CVA in the Subcortical left parietooccipital lobes and callosal splenium - causing x 2 falls, starting over 36 hrs before admission - she thankfully has mild deficits, Neuro following, ASA - Statin for now, Plavix x 3 weeks, PT - OT - SLP, will need SNF. Await her to clear quarantine. - she remains on Honey thick , will re consult SLP to advance diet if possible.   UTI  - Rocephin x 3, negative cultures.   COVID infection.   -She has had 2 doses of vaccine so far, she did have a dry cough, moderate inflammatory markers finished short  course of Decadron IV and Remdesivir - resolved clinically.   Hypertension.  - Allow for permissive hypertension since > 3 days, low dose Lopressor added on 12/03/20   Dementia. -   At risk for delirium.  Continue Namenda at home dose, minimize benzodiazepines and narcotics, son warned about possible worsening delirium while in the hospital. -He was encouraged to drink Ensure today, as well she was encouraged to get out of bed to chair with staff assistance.   SpO2: 96 %  Recent Labs  Lab 11/30/20 0453 12/01/20 0359 12/06/20 0832  WBC  --  10.4 15.5*  HGB  --  12.3 12.2  HCT  --  35.6* 36.9  PLT  --  320 370  CRP  --  5.2*  --   BNP 268.0* 88.1  --   DDIMER 0.71* 0.54*  --   AST  --  22  --   ALT  --  16  --   ALKPHOS  --  62  --   BILITOT  --  0.7  --   ALBUMIN  --  3.2*  --            Condition - Extremely Guarded  Family Communication  :  none at [place]   Code Status :  Full  Consults  :  Neuro  PUD Prophylaxis :    Procedures  :     MRI - Moderate-sized acute/early subacute left PCA territory infarct affecting the cortical/subcortical left parietooccipital lobes and callosal splenium. Redemonstrated chronic encephalomalacia and associated chronic blood products in the anterior frontal lobes, and anterior right temporal lobe, presumably posttraumatic in etiology. Chronic small-vessel ischemic changes which are severe in the cerebral white matter, and mild in the pons. Mild-to-moderate generalized cerebral atrophy. Comparatively mild cerebellar atrophy. Paranasal sinus disease  MRA - 1. No emergent large vessel occlusion or high-grade stenosis. 2. Mild narrowing of the right PCA P2 segment.    EEG - non acute  Carotid US - stable.  TTE - 1. Left ventricular ejection fraction, by estimation, is 70 to 75%. The left ventricle has hyperdynamic function. The left ventricle has no regional wall motion abnormalities. There is moderate asymmetric left ventricular  hypertrophy of the basal-septal  segment. Indeterminate diastolic filling due to E-A fusion.  2. Right ventricular systolic function is normal. The right ventricular size is normal.  3. The mitral valve is grossly normal. Mild mitral valve regurgitation. No evidence of mitral stenosis.  4. The aortic valve was not well visualized. There is mild calcification of the aortic valve. Aortic valve regurgitation is mild to moderate. No aortic stenosis is present      Disposition Plan  :    Status is: Inpatient -patient is stable for this discharge, awaiting till she finished her COVID quarantine before placement at facility.  DVT Prophylaxis  :    heparin injection 5,000 Units Start: 11/28/20 1715    Lab Results  Component Value Date   PLT 370 12/06/2020    Diet :  Diet Order             DIET SOFT Room service appropriate? No; Fluid consistency: Honey Thick  Diet effective now                    Inpatient Medications  Scheduled Meds:  aspirin  81 mg Oral Daily   atorvastatin  40 mg Oral Daily   cholecalciferol  75 mcg Oral Daily   clopidogrel  75 mg Oral Daily   dextromethorphan-guaiFENesin  1 tablet Oral BID   feeding supplement  237 mL Oral TID BM   heparin  5,000 Units Subcutaneous Q8H   mouth rinse  15 mL Mouth Rinse BID  memantine  10 mg Oral BID   metoprolol tartrate  25 mg Oral BID   multivitamin with minerals  1 tablet Oral Daily   Continuous Infusions:   PRN Meds:.acetaminophen **OR** acetaminophen, bisacodyl, haloperidol lactate, ondansetron **OR** ondansetron (ZOFRAN) IV  Antibiotics  :    Anti-infectives (From admission, onward)    Start     Dose/Rate Route Frequency Ordered Stop   11/29/20 1000  remdesivir 100 mg in sodium chloride 0.9 % 100 mL IVPB  Status:  Discontinued       See Hyperspace for full Linked Orders Report.   100 mg 200 mL/hr over 30 Minutes Intravenous Daily 11/28/20 1743 11/30/20 1242   11/28/20 1900  remdesivir 200 mg in sodium  chloride 0.9% 250 mL IVPB       See Hyperspace for full Linked Orders Report.   200 mg 580 mL/hr over 30 Minutes Intravenous Once 11/28/20 1743 11/28/20 2232   11/28/20 1715  cefTRIAXone (ROCEPHIN) 1 g in sodium chloride 0.9 % 100 mL IVPB        1 g 200 mL/hr over 30 Minutes Intravenous Every 24 hours 11/28/20 1700 11/30/20 1625   11/28/20 1330  cephALEXin (KEFLEX) capsule 500 mg  Status:  Discontinued        500 mg Oral Every 8 hours 11/28/20 1328 11/28/20 1700         Delta Pichon M.D on 12/06/2020 at 12:04 PM  To page go to [email or web address]   [facility] -  Office  [phone]  See all Orders from today for further details    Objective:   Vitals:   12/06/20 0731 12/06/20 0900 12/06/20 1000 12/06/20 1100  BP: 104/70     Pulse: 83     Resp: Temp: 98.2 F (36.8 C)     TempSrc: Oral     SpO2: 96%     Weight:      Height:        Wt Readings from Last 3 Encounters:  11/29/20 51.2 kg  10/03/20 51.7 kg  04/02/20 54 kg     Intake/Output Summary (Last 24 hours) at 12/06/2020 1204 Last data filed at 12/06/2020 0900 Gross per 24 hour  Intake 120 ml  Output 150 ml  Net -30 ml     Physical Exam  Awake Alert, Oriented X 1, frail. Symmetrical Chest wall movement, Good air movement bilaterally, CTAB RRR,No Gallops,Rubs or new Murmurs, No Parasternal Heave +ve B.Sounds, Abd Soft, No tenderness, No rebound - guarding or rigidity. No Cyanosis, Clubbing or edema, No new Rash or bruise          Data Review:    CBC Recent Labs  Lab 12/01/20 0359 12/06/20 0832  WBC 10.4 15.5*  HGB 12.3 12.2  HCT 35.6* 36.9  PLT 320 370  MCV 88.1 92.3  MCH 30.4 30.5  MCHC 34.6 33.1  RDW 12.6 12.6  LYMPHSABS 1.6  --   MONOABS 0.6  --   EOSABS 0.0  --   BASOSABS 0.0  --     Recent Labs  Lab 11/30/20 0453 12/01/20 0359 12/06/20 0832  NA  --  135 138  K  --  3.7 4.1  CL  --  103 103  CO2  --  22 26  GLUCOSE  --  166* 128*  BUN  --  13 16   CREATININE  --  0.50 0.58  CALCIUM  --  8.8* 9.0  AST  --  22  --  ALT  --  16  --   ALKPHOS  --  62  --   BILITOT  --  0.7  --   ALBUMIN  --  3.2*  --   MG  --  1.9  --   CRP  --  5.2*  --   DDIMER 0.71* 0.54*  --   BNP 268.0* 88.1  --     ------------------------------------------------------------------------------------------------------------------ No results for input(s): CHOL, HDL, LDLCALC, TRIG, CHOLHDL, LDLDIRECT in the last 72 hours.   Lab Results  Component Value Date   HGBA1C 5.7 (H) 11/29/2020   ------------------------------------------------------------------------------------------------------------------ No results for input(s): TSH, T4TOTAL, T3FREE, THYROIDAB in the last 72 hours.  Invalid input(s): FREET3  Cardiac Enzymes No results for input(s): CKMB, TROPONINI, MYOGLOBIN in the last 168 hours.  Invalid input(s): CK ------------------------------------------------------------------------------------------------------------------    Component Value Date/Time   BNP 88.1 12/01/2020 0359     Radiology Reports DG Chest 1 View  Result Date: 11/28/2020 CLINICAL DATA:  Stroke.  Pre MRI metal screening EXAM: CHEST  1 VIEW; PELVIS - 1-2 VIEW; ABDOMEN - 1 VIEW COMPARISON:  None. FINDINGS: The heart and mediastinal contours are within normal limits. Aortic calcification. 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 No focal consolidation. No pulmonary edema. No pleural effusion. No pneumothorax. Nonobstructive bowel gas pattern. No acute osseous abnormality. Multilevel degenerative changes of the thoracolumbar spine. No retained radiopaque foreign body. IMPRESSION: 1. No retained radiopaque foreign body. 2. No acute cardiopulmonary disease. 3. Nonobstructive bowel gas pattern. 4.  Aortic Atherosclerosis (ICD10-I70.0). Electronically Signed   By: Tish Frederickson M.D.   On: 11/28/2020 15:19   DG Pelvis 1-2 Views  Result Date: 11/28/2020 CLINICAL DATA:  Stroke.   Pre MRI metal screening EXAM: CHEST  1 VIEW; PELVIS - 1-2 VIEW; ABDOMEN - 1 VIEW COMPARISON:  None. FINDINGS: The heart and mediastinal contours are within normal limits. Aortic calcification. 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 No focal consolidation. No pulmonary edema. No pleural effusion. No pneumothorax. Nonobstructive bowel gas pattern. No acute osseous abnormality. Multilevel degenerative changes of the thoracolumbar spine. No retained radiopaque foreign body. IMPRESSION: 1. No retained radiopaque foreign body. 2. No acute cardiopulmonary disease. 3. Nonobstructive bowel gas pattern. 4.  Aortic Atherosclerosis (ICD10-I70.0). Electronically Signed   By: Tish Frederickson M.D.   On: 11/28/2020 15:19   DG Abd 1 View  Result Date: 11/28/2020 CLINICAL DATA:  Stroke.  Pre MRI metal screening EXAM: CHEST  1 VIEW; PELVIS - 1-2 VIEW; ABDOMEN - 1 VIEW COMPARISON:  None. FINDINGS: The heart and mediastinal contours are within normal limits. Aortic calcification. 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 No focal consolidation. No pulmonary edema. No pleural effusion. No pneumothorax. Nonobstructive bowel gas pattern. No acute osseous abnormality. Multilevel degenerative changes of the thoracolumbar spine. No retained radiopaque foreign body. IMPRESSION: 1. No retained radiopaque foreign body. 2. No acute cardiopulmonary disease. 3. Nonobstructive bowel gas pattern. 4.  Aortic Atherosclerosis (ICD10-I70.0). Electronically Signed   By: Tish Frederickson M.D.   On: 11/28/2020 15:19   CT HEAD WO CONTRAST ( )  Result Date: 11/28/2020 CLINICAL DATA:  Head trauma, minor (Age >= 65y) hx of dementia with repeated falls recently, hematoma near right eye, evaluate for ICH EXAM: CT HEAD WITHOUT CONTRAST TECHNIQUE: Contiguous axial images were obtained from the base of the skull through the vertex without intravenous contrast. COMPARISON:  MRI 04/16/2020, CT 12/24/2018 FINDINGS: Brain: New area of low  attenuation in the anteroinferior left frontal lobe with loss of gray-white differentiation and  developing encephalomalacia is new from prior and likely represents a late subacute or chronic left ACA territory infarction. Chronic right anterior frontal lobe infarct is unchanged. No intracranial hemorrhage. No extra-axial collection. Stable size and configuration of the ventricles. No mass lesion or mass effect. Extensive low-density changes within the periventricular and subcortical white matter compatible with chronic microvascular ischemic change. Mild diffuse cerebral volume loss. Vascular: Atherosclerotic calcifications involving the large vessels of the skull base. No unexpected hyperdense vessel. Skull: Normal. Negative for fracture or focal lesion. Sinuses/Orbits: Mucosal thickening and partial opacification of the right maxillary sinus. There is extensive mucosal thickening within the bilateral ethmoid air cells. Orbital structures within normal limits. Other: Right periorbital soft tissue swelling. IMPRESSION: 1. Low-attenuation changes in the left frontal lobe is new from prior and likely represents a late subacute to chronic left ACA territory infarction. Further evaluation with MRI of the brain is recommended. 2. No intracranial hemorrhage identified. 3. Chronic right anterior frontal lobe infarct is unchanged. 4. Right periorbital soft tissue swelling. 5. Paranasal sinus disease as described. Electronically Signed   By: Duanne Guess D.O.   On: 11/28/2020 12:58   MR ANGIO HEAD WO CONTRAST  Result Date: 11/28/2020 CLINICAL DATA:  Acute neurologic deficit EXAM: MRA HEAD WITHOUT CONTRAST TECHNIQUE: Angiographic images of the Circle of Willis were acquired using MRA technique without intravenous contrast. COMPARISON:  No pertinent prior exam. FINDINGS: POSTERIOR CIRCULATION: --Vertebral arteries: Normal --Inferior cerebellar arteries: Normal. --Basilar artery: Normal. --Superior cerebellar arteries:  Normal. --Posterior cerebral arteries: Mild narrowing of the right PCA P2 segment. Normal left. ANTERIOR CIRCULATION: --Intracranial internal carotid arteries: Normal. --Anterior cerebral arteries (ACA): Normal. --Middle cerebral arteries (MCA): Normal. ANATOMIC VARIANTS: None IMPRESSION: 1. No emergent large vessel occlusion or high-grade stenosis. 2. Mild narrowing of the right PCA P2 segment. Electronically Signed   By: Deatra Robinson M.D.   On: 11/28/2020 23:20   MR BRAIN WO CONTRAST  Result Date: 11/28/2020 CLINICAL DATA:  Stroke, follow-up; CT head with question of evolving or new subacute ACA infarct, patient with history of dementia and prior CVA, follow-up imaging. EXAM: MRI HEAD WITHOUT CONTRAST TECHNIQUE: Multiplanar, multiecho pulse sequences of the brain and surrounding structures were obtained without intravenous contrast. COMPARISON:  Prior head CT 11/28/2020.  Brain MRI 04/16/2020. FINDINGS: Brain: Mild to moderate generalized cerebral atrophy. Comparatively mild cerebellar atrophy. Moderate-sized acute/early subacute infarct within the cortical/subcortical left parietooccipital lobes and within the callosal splenium (PCA vascular territory). Redemonstrated chronic foci of encephalomalacia within the anterior frontal lobes and anterior right temporal lobe, with associated chronic blood products. Background advanced patchy and confluent T2 FLAIR hyperintense signal abnormality within the cerebral white matter, nonspecific but compatible with chronic small vessel ischemic disease. Mild chronic small vessel ischemic changes are also present within the pons. No evidence of an intracranial mass. No extra-axial fluid collection. No midline shift. Partially empty sella turcica. Vascular: No definite loss of expected proximal arterial flow voids. Skull and upper cervical spine: No focal suspicious marrow lesion. Incompletely assessed cervical spondylosis. Sinuses/Orbits: Visualized orbits show no acute  finding. Mild-to-moderate mucosal thickening within the bilateral ethmoid and right maxillary sinuses. Superimposed small volume frothy secretions within the right maxillary sinus. Mild mucosal thickening also present within the bilateral sphenoid and left maxillary sinuses. Other: 13 mm left parietal scalp lesion, nonspecific but possibly reflecting a sebaceous cyst. IMPRESSION: Moderate-sized acute/early subacute left PCA territory infarct affecting the cortical/subcortical left parietooccipital lobes and callosal splenium. Redemonstrated chronic encephalomalacia and associated chronic blood products in  the anterior frontal lobes, and anterior right temporal lobe, presumably posttraumatic in etiology. Chronic small-vessel ischemic changes which are severe in the cerebral white matter, and mild in the pons. Mild-to-moderate generalized cerebral atrophy. Comparatively mild cerebellar atrophy. Paranasal sinus disease, as described. Electronically Signed   By: Jackey Loge D.O.   On: 11/28/2020 16:02   DG Chest Port 1 View  Result Date: 11/29/2020 CLINICAL DATA:  Shortness of breath.  Altered mental status. EXAM: PORTABLE CHEST 1 VIEW COMPARISON:  11/28/2020 FINDINGS: The patient is rotated to the right with grossly unchanged cardiomediastinal silhouette. Eventration of the right hemidiaphragm is again noted. No confluent airspace opacity, edema, sizable pleural effusion, or pneumothorax is identified. Thoracic levoscoliosis is noted. IMPRESSION: No active disease. Electronically Signed   By: Sebastian Ache M.D.   On: 11/29/2020 05:31   DG Swallowing Func-Speech Pathology  Result Date: 12/03/2020 Table formatting from the original result was not included. Objective Swallowing Evaluation: Type of Study: MBS-Modified Barium Swallow Study  Patient Details Name: Andersyn Fragoso MRN: [id number] Date of Birth: [birth year] Today's Date: 12/03/2020 Time: SLP Start Time (ACUTE ONLY): 1516 -SLP Stop Time (ACUTE ONLY): 1540  SLP Time Calculation (min) (ACUTE ONLY): 24 min Past Medical History: Past Medical History: Diagnosis Date  Cerebrovascular disease 10/03/2020  ICH (intracerebral hemorrhage) (HCC) 11/01/2017  Right frontal  Memory change 10/03/2020  Seizures (HCC) 11/01/2017 Past Surgical History: Past Surgical History: Procedure Laterality Date  VAGINAL HYSTERECTOMY   HPI: 87 yo female who presented on 10/13, brought to hospital by her son from her ALF after weakness and multiple falls. 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 PMH ICH right frontal lobe 2019, seizures, dementia, HTN.  Subjective: Pt was pleasant and cooperative with son at bedside Assessment / Plan / Recommendation CHL IP CLINICAL IMPRESSIONS 12/03/2020 Clinical Impression Pt demonstrated moderate oropharyngeal dysphagia with aspiration present. Oral delays, decreased cohesion and reduced manipulation describe her oral phase. Pt's epiglottis did not fully invert during all swallows and timing of laryngeal closure was late intermittently leading to penetration to vocal cords (PAS 5) and aspiration with straw (PAS 8). Material also penetrated and aspirated during multiple swallows. Reduced lingual retraction led to vallecular residue and pyriform residue present with mild increase in volume after thicker consistencies. Given pt's cognitive state, compensatory techniques were not attempted. Recommend continue soft texture and upgrade to honey thick liquids, multiple swallows, throat clear intermittently, crush pills and full supervision. SLP Visit Diagnosis Dysphagia, oropharyngeal phase (R13.12) Attention and concentration deficit following -- Frontal lobe and executive function deficit following -- Impact on safety and function Moderate aspiration risk;Severe aspiration risk   CHL IP TREATMENT RECOMMENDATION 12/03/2020 Treatment Recommendations Therapy as outlined in treatment plan below   Prognosis 12/03/2020 Prognosis for Safe Diet Advancement  Fair Barriers to Reach Goals Cognitive deficits Barriers/Prognosis Comment -- CHL IP DIET RECOMMENDATION 12/03/2020 SLP Diet Recommendations Honey thick liquids;Other (Comment) Liquid Administration via Cup Medication Administration Crushed with puree Compensations Minimize environmental distractions;Slow rate;Small sips/bites;Multiple dry swallows after each bite/sip;Clear throat intermittently Postural Changes Seated upright at 90 degrees   CHL IP OTHER RECOMMENDATIONS 12/03/2020 Recommended Consults -- Oral Care Recommendations Oral care BID Other Recommendations --   CHL IP FOLLOW UP RECOMMENDATIONS 12/03/2020 Follow up Recommendations Skilled Nursing facility   [facility] IP FREQUENCY AND DURATION 12/03/2020 Speech Therapy Frequency (ACUTE ONLY) min 2x/week Treatment Duration 2 weeks      CHL IP ORAL PHASE 12/03/2020 Oral Phase Impaired Oral - Pudding Teaspoon --  Oral - Pudding Cup -- Oral - Honey Teaspoon -- Oral - Honey Cup Delayed oral transit Oral - Nectar Teaspoon -- Oral - Nectar Cup Delayed oral transit Oral - Nectar Straw -- Oral - Thin Teaspoon -- Oral - Thin Cup Decreased bolus cohesion;Lingual/palatal residue Oral - Thin Straw Decreased bolus cohesion Oral - Puree Delayed oral transit Oral - Mech Soft Weak lingual manipulation Oral - Regular Impaired mastication Oral - Multi-Consistency -- Oral - Pill -- Oral Phase - Comment --  CHL IP PHARYNGEAL PHASE 12/03/2020 Pharyngeal Phase Impaired Pharyngeal- Pudding Teaspoon -- Pharyngeal -- Pharyngeal- Pudding Cup -- Pharyngeal -- Pharyngeal- Honey Teaspoon -- Pharyngeal -- Pharyngeal- Honey Cup -- Pharyngeal -- Pharyngeal- Nectar Teaspoon -- Pharyngeal -- Pharyngeal- Nectar Cup -- Pharyngeal -- Pharyngeal- Nectar Straw -- Pharyngeal -- Pharyngeal- Thin Teaspoon -- Pharyngeal -- Pharyngeal- Thin Cup Reduced epiglottic inversion;Pharyngeal residue - valleculae;Pharyngeal residue - pyriform;Penetration/Aspiration during swallow;Reduced airway/laryngeal closure  Pharyngeal Material enters airway, remains ABOVE vocal cords then ejected out;Material enters airway, remains ABOVE vocal cords and not ejected out Pharyngeal- Thin Straw Penetration/Aspiration during swallow;Pharyngeal residue - valleculae;Pharyngeal residue - pyriform Pharyngeal Material enters airway, CONTACTS cords and not ejected out;Material enters airway, passes BELOW cords without attempt by patient to eject out (silent aspiration) Pharyngeal- Puree Pharyngeal residue - valleculae Pharyngeal -- Pharyngeal- Mechanical Soft Pharyngeal residue - valleculae Pharyngeal -- Pharyngeal- Regular Pharyngeal residue - valleculae Pharyngeal -- Pharyngeal- Multi-consistency -- Pharyngeal -- Pharyngeal- Pill -- Pharyngeal -- Pharyngeal Comment --  CHL IP CERVICAL ESOPHAGEAL PHASE 12/03/2020 Cervical Esophageal Phase WFL Pudding Teaspoon -- Pudding Cup -- Honey Teaspoon -- Honey Cup -- Nectar Teaspoon -- Nectar Cup -- Nectar Straw -- Thin Teaspoon -- Thin Cup -- Thin Straw -- Puree -- Mechanical Soft -- Regular -- Multi-consistency -- Pill -- Cervical Esophageal Comment -- Royce Macadamia 12/03/2020, 4:57 PM              EEG adult  Result Date: 11/29/2020 Charlsie Quest, MD     11/29/2020 12:33 PM Patient Name: Camaria Gerald MRN: [id number] Epilepsy Attending: Charlsie Quest Referring Physician/Provider: Janey Genta, NP Date: 11/29/2020 Duration: 25.17 mins Patient history: 87yo F with ams. EEG to evaluate for seizure Level of alertness: Awake, asleep AEDs during EEG study: None Technical aspects: This EEG study was done with scalp electrodes positioned according to the 10-20 International system of electrode placement. Electrical activity was acquired at a sampling rate of  and reviewed with a high frequency filter of  and a low frequency filter of . EEG data were recorded continuously and digitally stored. Description: The posterior dominant rhythm consists of 7.5 Hz activity of  moderate voltage (25-35 uV) seen predominantly in posterior head regions, symmetric and reactive to eye opening and eye closing. Sleep was characterized by vertex waves, sleep spindles (12 to 14 Hz), maximal frontocentral region. EEG showed intermittent generalized 3 to 6 Hz theta-delta slowing. Hyperventilation and photic stimulation were not performed.   ABNORMALITY - Intermittent slow, generalized IMPRESSION: This study is suggestive of mild diffuse encephalopathy, nonspecific etiology. No seizures or epileptiform discharges were seen throughout the recording. Charlsie Quest   ECHOCARDIOGRAM COMPLETE  Result Date: 11/29/2020    ECHOCARDIOGRAM REPORT   Patient Name:   MADHURI VACCA Date of Exam: 11/29/2020 Medical Rec #:  [id number]     Height:       62.0 in Accession #:    [id number]    Weight:       114.0 lb Date of  Birth:  01/27/1934     BSA:          1.505 m Patient Age:    87 years      BP:           157/82 mmHg Patient Gender: F             HR:           98 bpm. Exam Location:  Inpatient Procedure: 2D Echo, Cardiac Doppler, Color Doppler and Intracardiac            Opacification Agent Indications:    Stroke I63.9  History:        Patient has prior history of Echocardiogram examinations, most                 recent 11/12/2017.  Sonographer:    Leta Jungling RDCS Referring Phys: Heide Scales [facility]  Sonographer Comments: Technically difficult study due to poor echo windows. IMPRESSIONS  1. Left ventricular ejection fraction, by estimation, is 70 to 75%. The left ventricle has hyperdynamic function. The left ventricle has no regional wall motion abnormalities. There is moderate asymmetric left ventricular hypertrophy of the basal-septal  segment. Indeterminate diastolic filling due to E-A fusion.  2. Right ventricular systolic function is normal. The right ventricular size is normal.  3. The mitral valve is grossly normal. Mild mitral valve regurgitation. No evidence of mitral stenosis.  4. The aortic  valve was not well visualized. There is mild calcification of the aortic valve. Aortic valve regurgitation is mild to moderate. No aortic stenosis is present. Conclusion(s)/Recommendation(s): No intracardiac source of embolism detected on this transthoracic study. A transesophageal echocardiogram is recommended to exclude cardiac source of embolism if clinically indicated. FINDINGS  Left Ventricle: Left ventricular ejection fraction, by estimation, is 70 to 75%. The left ventricle has hyperdynamic function. The left ventricle has no regional wall motion abnormalities. Definity contrast agent was given IV to delineate the left ventricular endocardial borders. The left ventricular internal cavity size was normal in size. There is moderate asymmetric left ventricular hypertrophy of the basal-septal segment. Indeterminate diastolic filling due to E-A fusion. Right Ventricle: The right ventricular size is normal. No increase in right ventricular wall thickness. Right ventricular systolic function is normal. Left Atrium: Left atrial size was normal in size. Right Atrium: Right atrial size was normal in size. Pericardium: There is no evidence of pericardial effusion. Presence of pericardial fat pad. Mitral Valve: The mitral valve is grossly normal. Mild mitral valve regurgitation. No evidence of mitral valve stenosis. Tricuspid Valve: The tricuspid valve is normal in structure. Tricuspid valve regurgitation is mild . No evidence of tricuspid stenosis. Aortic Valve: The aortic valve was not well visualized. There is mild calcification of the aortic valve. Aortic valve regurgitation is mild to moderate. No aortic stenosis is present. Pulmonic Valve: The pulmonic valve was not well visualized. Pulmonic valve regurgitation is mild to moderate. No evidence of pulmonic stenosis. Aorta: The aortic root is normal in size and structure. Venous: The inferior vena cava was not well visualized. IAS/Shunts: The interatrial septum was  not well visualized.  LEFT VENTRICLE PLAX 2D LVIDd:         3.40 cm   Diastology LVIDs:         2.90 cm   LV e' medial:    5.00 cm/s LV PW:         0.90 cm   LV E/e' medial:  12.0 LV IVS:  1.30 cm   LV e' lateral:   5.66 cm/s LVOT diam:     2.10 cm   LV E/e' lateral: 10.6 LV SV:         38 LV SV Index:   25 LVOT Area:     3.46 cm  RIGHT VENTRICLE RV S prime:     16.80 cm/s LEFT ATRIUM           Index LA diam:      2.70 cm 1.79 cm/m LA Vol (A4C): 14.5 ml 9.63 ml/m  AORTIC VALVE LVOT Vmax:   72.70 cm/s LVOT Vmean:  49.000 cm/s LVOT VTI:    0.109 m  AORTA Ao Root diam: 3.30 cm Ao Asc diam:  3.20 cm MITRAL VALVE                TRICUSPID VALVE MV Area (PHT): 5.46 cm     TR Peak grad:   23.2 mmHg MV Decel Time: 139 msec     TR Vmax:        241.00 cm/s MV E velocity: 59.75 cm/s MV A velocity: 104.00 cm/s  SHUNTS MV E/A ratio:  0.57         Systemic VTI:  0.11 m                             Systemic Diam: 2.10 cm Weston Brass MD Electronically signed by Weston Brass MD Signature Date/Time: 11/29/2020/6:57:24 PM    Final    VAS US CAROTID (at [facility] and [facility] only)  Result Date: 11/29/2020 Carotid Arterial Duplex Study Patient Name:  LADELL BEY  Date of Exam:   11/29/2020 Medical Rec #: [id number]      Accession #:    [id number] Date of Birth: 1933-07-13      Patient Gender: F Patient Age:   20 years Exam Location:  [facility] Procedure:      VAS US CAROTID Referring Phys: Terrilee Files [facility] --------------------------------------------------------------------------------  Indications:       CVA and recent falls, weakness, COVID+. Risk Factors:      Hypertension. Other Factors:     HX of ICH (2019). Comparison Study:  Previous exam 12/07/2017 1-39% BIL Performing Technologist: Ernestene Mention RVT, RDMS  Examination Guidelines: A complete evaluation includes B-mode imaging, spectral Doppler, color Doppler, and power Doppler as needed of all accessible portions of each vessel. Bilateral testing is considered an  integral part of a complete examination. Limited examinations for reoccurring indications may be performed as noted.  Right Carotid Findings: +----------+--------+--------+--------+------------------+------------------+           PSV cm/sEDV cm/sStenosisPlaque DescriptionComments           +----------+--------+--------+--------+------------------+------------------+ CCA Prox  61      9                                 intimal thickening +----------+--------+--------+--------+------------------+------------------+ CCA Distal62      14                                intimal thickening +----------+--------+--------+--------+------------------+------------------+ ICA Prox  35      10              calcific and focaltortuous           +----------+--------+--------+--------+------------------+------------------+ ICA Distal74      17  tortuous           +----------+--------+--------+--------+------------------+------------------+ ECA       65      0               calcific                             +----------+--------+--------+--------+------------------+------------------+ +----------+--------+-------+----------------+-------------------+           PSV cm/sEDV cmsDescribe        Arm Pressure (mmHG) +----------+--------+-------+----------------+-------------------+ [id number]             Multiphasic, WNL                    +----------+--------+-------+----------------+-------------------+ +---------+--------+--+--------+--+---------+ VertebralPSV cm/s58EDV cm/s11Antegrade +---------+--------+--+--------+--+---------+  Left Carotid Findings: +----------+--------+--------+--------+------------------+---------------------+           PSV cm/sEDV cm/sStenosisPlaque DescriptionComments              +----------+--------+--------+--------+------------------+---------------------+ CCA Prox  53      11                                                       +----------+--------+--------+--------+------------------+---------------------+ CCA Distal49      14                                intimal thickening    +----------+--------+--------+--------+------------------+---------------------+ ICA Prox  41      14              calcific          intimal thickening &                                                      tortuous              +----------+--------+--------+--------+------------------+---------------------+ ICA Distal101     27                                                      +----------+--------+--------+--------+------------------+---------------------+ ECA       40      0               calcific                                +----------+--------+--------+--------+------------------+---------------------+ +----------+--------+--------+----------------+-------------------+           PSV cm/sEDV cm/sDescribe        Arm Pressure (mmHG) +----------+--------+--------+----------------+-------------------+ Subclavian102             Multiphasic, WNL                    +----------+--------+--------+----------------+-------------------+ +---------+--------+--+--------+--+---------+ VertebralPSV cm/s52EDV cm/s11Antegrade +---------+--------+--+--------+--+---------+   Summary: Right Carotid: Velocities in the right ICA are consistent with a 1-39% stenosis.  The extracranial vessels were near-normal with only minimal wall                thickening or plaque. Left Carotid: Velocities in the left ICA are consistent with a 1-39% stenosis.               The extracranial vessels were near-normal with only minimal wall               thickening or plaque. Vertebrals:  Bilateral vertebral arteries demonstrate antegrade flow. Subclavians: Normal flow hemodynamics were seen in bilateral subclavian              arteries. *See table(s) above for measurements and observations.   Electronically signed by Sherald Hess MD on 11/29/2020 at 4:10:26 PM.    Final

## 2020-12-06 NOTE — Progress Notes (Signed)
Speech Language Pathology Treatment: Dysphagia  Patient Details Name: Ann Johns MRN: [id number] DOB: 1933/06/10 Today's Date: 12/06/2020 Time: 8416-6063 SLP Time Calculation (min) (ACUTE ONLY): 25 min  Assessment / Plan / Recommendation Clinical Impression  Pt seen this date for diagnostic treatment for swallow function post instrumental assessment 10/18, which revealed aspiration of thin and NTL without sensation. Pt participated in completion of oral care prior to trials of NTL and HTL via cup. With minimal verbal cueing, pt performed small cup sips of each consistency which resulted in no overt s/sx of aspiration initially. As trials of HTL along with mechanical soft solid continued, x2 overt coughing and mild wet/gurgly vocal quality observed. Cued pt to clear throat and perform repeat effortful swallow to clear suspected aspirate and pharyngeal residuals. Given hx of silent aspiration, noted in MBSS report and clinical observations this date, recommend pt continue with current diet recommendations. SLP to f/u for continued PO trials and readiness for repeat instrumental assessment as indicated by reduction in s/sx of aspiration at bedside.    HPI HPI: Ann Johns who presented on 10/13, brought to hospital by her son from her ALF after weakness and multiple falls. 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 PMH ICH right frontal lobe 2019, seizures, dementia, HTN.      SLP Plan  Continue with current plan of care;MBS      Recommendations for follow up therapy are one component of a multi-disciplinary discharge planning process, led by the attending physician.  Recommendations may be updated based on patient status, additional functional criteria and insurance authorization.    Recommendations  Diet recommendations: Dysphagia 3 (mechanical soft);Honey-thick liquid Liquids provided via: Cup Medication Administration: Whole meds with puree Supervision: Staff to  assist with self feeding;Full supervision/cueing for compensatory strategies Compensations: Minimize environmental distractions;Slow rate;Small sips/bites;Multiple dry swallows after each bite/sip;Clear throat intermittently Postural Changes and/or Swallow Maneuvers: Seated upright 90 degrees                Oral Care Recommendations: Oral care BID;Staff/trained caregiver to provide oral care Follow up Recommendations: Skilled Nursing facility SLP Visit Diagnosis: Dysphagia, oropharyngeal phase (R13.12) Plan: Continue with current plan of care;MBS       GO               Ann Echevaria, MA, CCC-SLP Acute Rehabilitation Services Office Number: [phone]  Ann Johns  12/06/2020, 1:46 PM

## 2020-12-07 NOTE — Progress Notes (Signed)
PROGRESS NOTE                                                                                                                                                                                                             Patient Demographics:    Ann Johns, is a 87 y.o. female, DOB - 12/09/1933, WUJ:[id number]  Outpatient Primary MD for the patient is Lewis Moccasin, MD    LOS - 9  Admit date - 11/28/2020    Chief Complaint  Patient presents with   Fall   Altered Mental Status       Brief Narrative (HPI from H&P)     - Ann Johns  is a 87 y.o. female, with history of intracerebral hemorrhage in 2019, seizures, early dementia, essential hypertension who lives at an assisted living facility was brought in by her son to the hospital for evaluation.  According to the son she was in today morning around 9 AM when she had a fall at assisted living facility, she subsequently did well however she was feeling weak the whole day, this morning she had another 4 at assisted living facility after which she was brought to the [facility] ER where she was found to have an acute stroke, UTI and a COVID-19 infection.  Neurology was consulted .   Subjective:   Patient herself reports some mild cough, otherwise she denies any complaints, her appetite is variable, but she has been drinking her Ensure.     Assessment  & Plan :     Left PCA  - CVA in the Subcortical left parietooccipital lobes and callosal splenium - causing x 2 falls, starting over 36 hrs before admission - she thankfully has mild deficits, Neuro following, ASA - Statin for now, Plavix x 3 weeks, PT - OT - SLP, will need SNF. Await her to clear quarantine. -She remains on honey thick, reassessed again by SLP on 10/22, with diet recommendation remain dysphagia 3 with honey thick liquid.     UTI  - Rocephin x 3, negative cultures.   COVID infection.   -She has had 2 doses  of vaccine so far, she did have a dry cough, moderate inflammatory markers finished short course of Decadron IV and Remdesivir - resolved clinically.   Hypertension.  - Allow for permissive hypertension since > 3 days, low  dose Lopressor added on 12/03/20   Dementia. -   At risk for delirium.  Continue Namenda at home dose, minimize benzodiazepines and narcotics, son warned about possible worsening delirium while in the hospital. -He was encouraged to drink Ensure today, as well she was encouraged to get out of bed to chair with staff assistance.   SpO2: 92 %  Recent Labs  Lab 12/01/20 0359 12/06/20 0832  WBC 10.4 15.5*  HGB 12.3 12.2  HCT 35.6* 36.9  PLT 320 370  CRP 5.2*  --   BNP 88.1  --   DDIMER 0.54*  --   AST 22  --   ALT 16  --   ALKPHOS 62  --   BILITOT 0.7  --   ALBUMIN 3.2*  --            Condition - Extremely Guarded  Family Communication  :  none at [place]   Code Status :  Full  Consults  :  Neuro  PUD Prophylaxis :    Procedures  :     MRI - Moderate-sized acute/early subacute left PCA territory infarct affecting the cortical/subcortical left parietooccipital lobes and callosal splenium. Redemonstrated chronic encephalomalacia and associated chronic blood products in the anterior frontal lobes, and anterior right temporal lobe, presumably posttraumatic in etiology. Chronic small-vessel ischemic changes which are severe in the cerebral white matter, and mild in the pons. Mild-to-moderate generalized cerebral atrophy. Comparatively mild cerebellar atrophy. Paranasal sinus disease  MRA - 1. No emergent large vessel occlusion or high-grade stenosis. 2. Mild narrowing of the right PCA P2 segment.    EEG - non acute  Carotid US - stable.  TTE - 1. Left ventricular ejection fraction, by estimation, is 70 to 75%. The left ventricle has hyperdynamic function. The left ventricle has no regional wall motion abnormalities. There is moderate asymmetric left  ventricular hypertrophy of the basal-septal  segment. Indeterminate diastolic filling due to E-A fusion.  2. Right ventricular systolic function is normal. The right ventricular size is normal.  3. The mitral valve is grossly normal. Mild mitral valve regurgitation. No evidence of mitral stenosis.  4. The aortic valve was not well visualized. There is mild calcification of the aortic valve. Aortic valve regurgitation is mild to moderate. No aortic stenosis is present      Disposition Plan  :    Status is: Inpatient -patient is stable for this discharge, awaiting till she finished her COVID quarantine before placement at facility.  DVT Prophylaxis  :    heparin injection 5,000 Units Start: 11/28/20 1715    Lab Results  Component Value Date   PLT 370 12/06/2020    Diet :  Diet Order             DIET SOFT Room service appropriate? No; Fluid consistency: Honey Thick  Diet effective now                    Inpatient Medications  Scheduled Meds:  aspirin  81 mg Oral Daily   atorvastatin  40 mg Oral Daily   cholecalciferol  75 mcg Oral Daily   clopidogrel  75 mg Oral Daily   dextromethorphan-guaiFENesin  1 tablet Oral BID   feeding supplement  237 mL Oral TID BM   heparin  5,000 Units Subcutaneous Q8H   mouth rinse  15 mL Mouth Rinse BID   memantine  10 mg Oral BID   metoprolol tartrate  25 mg Oral BID  multivitamin with minerals  1 tablet Oral Daily   Continuous Infusions:   PRN Meds:.acetaminophen **OR** acetaminophen, bisacodyl, haloperidol lactate, ondansetron **OR** ondansetron (ZOFRAN) IV  Antibiotics  :    Anti-infectives (From admission, onward)    Start     Dose/Rate Route Frequency Ordered Stop   11/29/20 1000  remdesivir 100 mg in sodium chloride 0.9 % 100 mL IVPB  Status:  Discontinued       See Hyperspace for full Linked Orders Report.   100 mg 200 mL/hr over 30 Minutes Intravenous Daily 11/28/20 1743 11/30/20 1242   11/28/20 1900  remdesivir 200 mg  in sodium chloride 0.9% 250 mL IVPB       See Hyperspace for full Linked Orders Report.   200 mg 580 mL/hr over 30 Minutes Intravenous Once 11/28/20 1743 11/28/20 2232   11/28/20 1715  cefTRIAXone (ROCEPHIN) 1 g in sodium chloride 0.9 % 100 mL IVPB        1 g 200 mL/hr over 30 Minutes Intravenous Every 24 hours 11/28/20 1700 11/30/20 1625   11/28/20 1330  cephALEXin (KEFLEX) capsule 500 mg  Status:  Discontinued        500 mg Oral Every 8 hours 11/28/20 1328 11/28/20 1700         Emer Onnen M.D on 12/07/2020 at 12:39 PM  To page go to [email or web address]   [facility] -  Office  [phone]  See all Orders from today for further details    Objective:   Vitals:   12/06/20 2001 12/06/20 2343 12/07/20 0402 12/07/20 0739  BP:  123/64 (!) 123/97 138/88  Pulse:  85 87 91  Resp:  16 18 14   Temp: 97.7 F (36.5 C) (!) 97.4 F (36.3 C) 97.6 F (36.4 C) (!) 97.5 F (36.4 C)  TempSrc:  Axillary Axillary Oral  SpO2:  96% 95% 92%  Weight:      Height:        Wt Readings from Last 3 Encounters:  11/29/20 51.2 kg  10/03/20 51.7 kg  04/02/20 54 kg     Intake/Output Summary (Last 24 hours) at 12/07/2020 1239 Last data filed at 12/06/2020 2100 Gross per 24 hour  Intake 240 ml  Output --  Net 240 ml     Physical Exam  Awake Alert, she is pleasant, demented, frail. Symmetrical Chest wall movement, Good air movement bilaterally, CTAB RRR,No Gallops,Rubs or new Murmurs, No Parasternal Heave +ve B.Sounds, Abd Soft, No tenderness, No rebound - guarding or rigidity. No Cyanosis, Clubbing or edema, No new Rash or bruise           Data Review:    CBC Recent Labs  Lab 12/01/20 0359 12/06/20 0832  WBC 10.4 15.5*  HGB 12.3 12.2  HCT 35.6* 36.9  PLT 320 370  MCV 88.1 92.3  MCH 30.4 30.5  MCHC 34.6 33.1  RDW 12.6 12.6  LYMPHSABS 1.6  --   MONOABS 0.6  --   EOSABS 0.0  --   BASOSABS 0.0  --     Recent Labs  Lab 12/01/20 0359 12/06/20 0832  NA 135  138  K 3.7 4.1  CL 103 103  CO2 22 26  GLUCOSE 166* 128*  BUN 13 16  CREATININE 0.50 0.58  CALCIUM 8.8* 9.0  AST 22  --   ALT 16  --   ALKPHOS 62  --   BILITOT 0.7  --   ALBUMIN 3.2*  --   MG 1.9  --  CRP 5.2*  --   DDIMER 0.54*  --   BNP 88.1  --     ------------------------------------------------------------------------------------------------------------------ No results for input(s): CHOL, HDL, LDLCALC, TRIG, CHOLHDL, LDLDIRECT in the last 72 hours.   Lab Results  Component Value Date   HGBA1C 5.7 (H) 11/29/2020   ------------------------------------------------------------------------------------------------------------------ No results for input(s): TSH, T4TOTAL, T3FREE, THYROIDAB in the last 72 hours.  Invalid input(s): FREET3  Cardiac Enzymes No results for input(s): CKMB, TROPONINI, MYOGLOBIN in the last 168 hours.  Invalid input(s): CK ------------------------------------------------------------------------------------------------------------------    Component Value Date/Time   BNP 88.1 12/01/2020 0359     Radiology Reports DG Chest 1 View  Result Date: 11/28/2020 CLINICAL DATA:  Stroke.  Pre MRI metal screening EXAM: CHEST  1 VIEW; PELVIS - 1-2 VIEW; ABDOMEN - 1 VIEW COMPARISON:  None. FINDINGS: The heart and mediastinal contours are within normal limits. Aortic calcification. 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 No focal consolidation. No pulmonary edema. No pleural effusion. No pneumothorax. Nonobstructive bowel gas pattern. No acute osseous abnormality. Multilevel degenerative changes of the thoracolumbar spine. No retained radiopaque foreign body. IMPRESSION: 1. No retained radiopaque foreign body. 2. No acute cardiopulmonary disease. 3. Nonobstructive bowel gas pattern. 4.  Aortic Atherosclerosis (ICD10-I70.0). Electronically Signed   By: Tish Frederickson M.D.   On: 11/28/2020 15:19   DG Pelvis 1-2 Views  Result Date: 11/28/2020 CLINICAL  DATA:  Stroke.  Pre MRI metal screening EXAM: CHEST  1 VIEW; PELVIS - 1-2 VIEW; ABDOMEN - 1 VIEW COMPARISON:  None. FINDINGS: The heart and mediastinal contours are within normal limits. Aortic calcification. 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 No focal consolidation. No pulmonary edema. No pleural effusion. No pneumothorax. Nonobstructive bowel gas pattern. No acute osseous abnormality. Multilevel degenerative changes of the thoracolumbar spine. No retained radiopaque foreign body. IMPRESSION: 1. No retained radiopaque foreign body. 2. No acute cardiopulmonary disease. 3. Nonobstructive bowel gas pattern. 4.  Aortic Atherosclerosis (ICD10-I70.0). Electronically Signed   By: Tish Frederickson M.D.   On: 11/28/2020 15:19   DG Abd 1 View  Result Date: 11/28/2020 CLINICAL DATA:  Stroke.  Pre MRI metal screening EXAM: CHEST  1 VIEW; PELVIS - 1-2 VIEW; ABDOMEN - 1 VIEW COMPARISON:  None. FINDINGS: The heart and mediastinal contours are within normal limits. Aortic calcification. 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 No focal consolidation. No pulmonary edema. No pleural effusion. No pneumothorax. Nonobstructive bowel gas pattern. No acute osseous abnormality. Multilevel degenerative changes of the thoracolumbar spine. No retained radiopaque foreign body. IMPRESSION: 1. No retained radiopaque foreign body. 2. No acute cardiopulmonary disease. 3. Nonobstructive bowel gas pattern. 4.  Aortic Atherosclerosis (ICD10-I70.0). Electronically Signed   By: Tish Frederickson M.D.   On: 11/28/2020 15:19   CT HEAD WO CONTRAST ( )  Result Date: 11/28/2020 CLINICAL DATA:  Head trauma, minor (Age >= 65y) hx of dementia with repeated falls recently, hematoma near right eye, evaluate for ICH EXAM: CT HEAD WITHOUT CONTRAST TECHNIQUE: Contiguous axial images were obtained from the base of the skull through the vertex without intravenous contrast. COMPARISON:  MRI 04/16/2020, CT 12/24/2018 FINDINGS: Brain: New  area of low attenuation in the anteroinferior left frontal lobe with loss of gray-white differentiation and developing encephalomalacia is new from prior and likely represents a late subacute or chronic left ACA territory infarction. Chronic right anterior frontal lobe infarct is unchanged. No intracranial hemorrhage. No extra-axial collection. Stable size and configuration of the ventricles. No mass lesion or mass effect. Extensive low-density changes within the  periventricular and subcortical white matter compatible with chronic microvascular ischemic change. Mild diffuse cerebral volume loss. Vascular: Atherosclerotic calcifications involving the large vessels of the skull base. No unexpected hyperdense vessel. Skull: Normal. Negative for fracture or focal lesion. Sinuses/Orbits: Mucosal thickening and partial opacification of the right maxillary sinus. There is extensive mucosal thickening within the bilateral ethmoid air cells. Orbital structures within normal limits. Other: Right periorbital soft tissue swelling. IMPRESSION: 1. Low-attenuation changes in the left frontal lobe is new from prior and likely represents a late subacute to chronic left ACA territory infarction. Further evaluation with MRI of the brain is recommended. 2. No intracranial hemorrhage identified. 3. Chronic right anterior frontal lobe infarct is unchanged. 4. Right periorbital soft tissue swelling. 5. Paranasal sinus disease as described. Electronically Signed   By: Duanne Guess D.O.   On: 11/28/2020 12:58   MR ANGIO HEAD WO CONTRAST  Result Date: 11/28/2020 CLINICAL DATA:  Acute neurologic deficit EXAM: MRA HEAD WITHOUT CONTRAST TECHNIQUE: Angiographic images of the Circle of Willis were acquired using MRA technique without intravenous contrast. COMPARISON:  No pertinent prior exam. FINDINGS: POSTERIOR CIRCULATION: --Vertebral arteries: Normal --Inferior cerebellar arteries: Normal. --Basilar artery: Normal. --Superior  cerebellar arteries: Normal. --Posterior cerebral arteries: Mild narrowing of the right PCA P2 segment. Normal left. ANTERIOR CIRCULATION: --Intracranial internal carotid arteries: Normal. --Anterior cerebral arteries (ACA): Normal. --Middle cerebral arteries (MCA): Normal. ANATOMIC VARIANTS: None IMPRESSION: 1. No emergent large vessel occlusion or high-grade stenosis. 2. Mild narrowing of the right PCA P2 segment. Electronically Signed   By: Deatra Robinson M.D.   On: 11/28/2020 23:20   MR BRAIN WO CONTRAST  Result Date: 11/28/2020 CLINICAL DATA:  Stroke, follow-up; CT head with question of evolving or new subacute ACA infarct, patient with history of dementia and prior CVA, follow-up imaging. EXAM: MRI HEAD WITHOUT CONTRAST TECHNIQUE: Multiplanar, multiecho pulse sequences of the brain and surrounding structures were obtained without intravenous contrast. COMPARISON:  Prior head CT 11/28/2020.  Brain MRI 04/16/2020. FINDINGS: Brain: Mild to moderate generalized cerebral atrophy. Comparatively mild cerebellar atrophy. Moderate-sized acute/early subacute infarct within the cortical/subcortical left parietooccipital lobes and within the callosal splenium (PCA vascular territory). Redemonstrated chronic foci of encephalomalacia within the anterior frontal lobes and anterior right temporal lobe, with associated chronic blood products. Background advanced patchy and confluent T2 FLAIR hyperintense signal abnormality within the cerebral white matter, nonspecific but compatible with chronic small vessel ischemic disease. Mild chronic small vessel ischemic changes are also present within the pons. No evidence of an intracranial mass. No extra-axial fluid collection. No midline shift. Partially empty sella turcica. Vascular: No definite loss of expected proximal arterial flow voids. Skull and upper cervical spine: No focal suspicious marrow lesion. Incompletely assessed cervical spondylosis. Sinuses/Orbits: Visualized  orbits show no acute finding. Mild-to-moderate mucosal thickening within the bilateral ethmoid and right maxillary sinuses. Superimposed small volume frothy secretions within the right maxillary sinus. Mild mucosal thickening also present within the bilateral sphenoid and left maxillary sinuses. Other: 13 mm left parietal scalp lesion, nonspecific but possibly reflecting a sebaceous cyst. IMPRESSION: Moderate-sized acute/early subacute left PCA territory infarct affecting the cortical/subcortical left parietooccipital lobes and callosal splenium. Redemonstrated chronic encephalomalacia and associated chronic blood products in the anterior frontal lobes, and anterior right temporal lobe, presumably posttraumatic in etiology. Chronic small-vessel ischemic changes which are severe in the cerebral white matter, and mild in the pons. Mild-to-moderate generalized cerebral atrophy. Comparatively mild cerebellar atrophy. Paranasal sinus disease, as described. Electronically Signed   By: Ronaldo Miyamoto  Renette Butters D.O.   On: 11/28/2020 16:02   DG Chest Port 1 View  Result Date: 11/29/2020 CLINICAL DATA:  Shortness of breath.  Altered mental status. EXAM: PORTABLE CHEST 1 VIEW COMPARISON:  11/28/2020 FINDINGS: The patient is rotated to the right with grossly unchanged cardiomediastinal silhouette. Eventration of the right hemidiaphragm is again noted. No confluent airspace opacity, edema, sizable pleural effusion, or pneumothorax is identified. Thoracic levoscoliosis is noted. IMPRESSION: No active disease. Electronically Signed   By: Sebastian Ache M.D.   On: 11/29/2020 05:31   DG Swallowing Func-Speech Pathology  Result Date: 12/03/2020 Table formatting from the original result was not included. Objective Swallowing Evaluation: Type of Study: MBS-Modified Barium Swallow Study  Patient Details Name: Ann Johns MRN: [id number] Date of Birth: 1933/06/30 Today's Date: 12/03/2020 Time: SLP Start Time (ACUTE ONLY): 1516 -SLP Stop Time  (ACUTE ONLY): 1540 SLP Time Calculation (min) (ACUTE ONLY): 24 min Past Medical History: Past Medical History: Diagnosis Date  Cerebrovascular disease 10/03/2020  ICH (intracerebral hemorrhage) (HCC) 11/01/2017  Right frontal  Memory change 10/03/2020  Seizures (HCC) 11/01/2017 Past Surgical History: Past Surgical History: Procedure Laterality Date  VAGINAL HYSTERECTOMY   HPI: 87 yo female who presented on 10/13, brought to hospital by her son from her ALF after weakness and multiple falls. 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 PMH ICH right frontal lobe 2019, seizures, dementia, HTN.  Subjective: Pt was pleasant and cooperative with son at bedside Assessment / Plan / Recommendation CHL IP CLINICAL IMPRESSIONS 12/03/2020 Clinical Impression Pt demonstrated moderate oropharyngeal dysphagia with aspiration present. Oral delays, decreased cohesion and reduced manipulation describe her oral phase. Pt's epiglottis did not fully invert during all swallows and timing of laryngeal closure was late intermittently leading to penetration to vocal cords (PAS 5) and aspiration with straw (PAS 8). Material also penetrated and aspirated during multiple swallows. Reduced lingual retraction led to vallecular residue and pyriform residue present with mild increase in volume after thicker consistencies. Given pt's cognitive state, compensatory techniques were not attempted. Recommend continue soft texture and upgrade to honey thick liquids, multiple swallows, throat clear intermittently, crush pills and full supervision. SLP Visit Diagnosis Dysphagia, oropharyngeal phase (R13.12) Attention and concentration deficit following -- Frontal lobe and executive function deficit following -- Impact on safety and function Moderate aspiration risk;Severe aspiration risk   CHL IP TREATMENT RECOMMENDATION 12/03/2020 Treatment Recommendations Therapy as outlined in treatment plan below   Prognosis 12/03/2020 Prognosis for Safe  Diet Advancement Fair Barriers to Reach Goals Cognitive deficits Barriers/Prognosis Comment -- CHL IP DIET RECOMMENDATION 12/03/2020 SLP Diet Recommendations Honey thick liquids;Other (Comment) Liquid Administration via Cup Medication Administration Crushed with puree Compensations Minimize environmental distractions;Slow rate;Small sips/bites;Multiple dry swallows after each bite/sip;Clear throat intermittently Postural Changes Seated upright at 90 degrees   CHL IP OTHER RECOMMENDATIONS 12/03/2020 Recommended Consults -- Oral Care Recommendations Oral care BID Other Recommendations --   CHL IP FOLLOW UP RECOMMENDATIONS 12/03/2020 Follow up Recommendations Skilled Nursing facility   [facility] IP FREQUENCY AND DURATION 12/03/2020 Speech Therapy Frequency (ACUTE ONLY) min 2x/week Treatment Duration 2 weeks      CHL IP ORAL PHASE 12/03/2020 Oral Phase Impaired Oral - Pudding Teaspoon -- Oral - Pudding Cup -- Oral - Honey Teaspoon -- Oral - Honey Cup Delayed oral transit Oral - Nectar Teaspoon -- Oral - Nectar Cup Delayed oral transit Oral - Nectar Straw -- Oral - Thin Teaspoon -- Oral - Thin Cup Decreased bolus cohesion;Lingual/palatal residue Oral -  Thin Straw Decreased bolus cohesion Oral - Puree Delayed oral transit Oral - Mech Soft Weak lingual manipulation Oral - Regular Impaired mastication Oral - Multi-Consistency -- Oral - Pill -- Oral Phase - Comment --  CHL IP PHARYNGEAL PHASE 12/03/2020 Pharyngeal Phase Impaired Pharyngeal- Pudding Teaspoon -- Pharyngeal -- Pharyngeal- Pudding Cup -- Pharyngeal -- Pharyngeal- Honey Teaspoon -- Pharyngeal -- Pharyngeal- Honey Cup -- Pharyngeal -- Pharyngeal- Nectar Teaspoon -- Pharyngeal -- Pharyngeal- Nectar Cup -- Pharyngeal -- Pharyngeal- Nectar Straw -- Pharyngeal -- Pharyngeal- Thin Teaspoon -- Pharyngeal -- Pharyngeal- Thin Cup Reduced epiglottic inversion;Pharyngeal residue - valleculae;Pharyngeal residue - pyriform;Penetration/Aspiration during swallow;Reduced  airway/laryngeal closure Pharyngeal Material enters airway, remains ABOVE vocal cords then ejected out;Material enters airway, remains ABOVE vocal cords and not ejected out Pharyngeal- Thin Straw Penetration/Aspiration during swallow;Pharyngeal residue - valleculae;Pharyngeal residue - pyriform Pharyngeal Material enters airway, CONTACTS cords and not ejected out;Material enters airway, passes BELOW cords without attempt by patient to eject out (silent aspiration) Pharyngeal- Puree Pharyngeal residue - valleculae Pharyngeal -- Pharyngeal- Mechanical Soft Pharyngeal residue - valleculae Pharyngeal -- Pharyngeal- Regular Pharyngeal residue - valleculae Pharyngeal -- Pharyngeal- Multi-consistency -- Pharyngeal -- Pharyngeal- Pill -- Pharyngeal -- Pharyngeal Comment --  CHL IP CERVICAL ESOPHAGEAL PHASE 12/03/2020 Cervical Esophageal Phase WFL Pudding Teaspoon -- Pudding Cup -- Honey Teaspoon -- Honey Cup -- Nectar Teaspoon -- Nectar Cup -- Nectar Straw -- Thin Teaspoon -- Thin Cup -- Thin Straw -- Puree -- Mechanical Soft -- Regular -- Multi-consistency -- Pill -- Cervical Esophageal Comment -- Royce Macadamia 12/03/2020, 4:57 PM              EEG adult  Result Date: 11/29/2020 Charlsie Quest, MD     11/29/2020 12:33 PM Patient Name: Rickeya Manus MRN: [id number] Epilepsy Attending: Charlsie Quest Referring Physician/Provider: Janey Genta, NP Date: 11/29/2020 Duration: 25.17 mins Patient history: 87yo F with ams. EEG to evaluate for seizure Level of alertness: Awake, asleep AEDs during EEG study: None Technical aspects: This EEG study was done with scalp electrodes positioned according to the 10-20 International system of electrode placement. Electrical activity was acquired at a sampling rate of  and reviewed with a high frequency filter of  and a low frequency filter of . EEG data were recorded continuously and digitally stored. Description: The posterior dominant rhythm consists  of 7.5 Hz activity of moderate voltage (25-35 uV) seen predominantly in posterior head regions, symmetric and reactive to eye opening and eye closing. Sleep was characterized by vertex waves, sleep spindles (12 to 14 Hz), maximal frontocentral region. EEG showed intermittent generalized 3 to 6 Hz theta-delta slowing. Hyperventilation and photic stimulation were not performed.   ABNORMALITY - Intermittent slow, generalized IMPRESSION: This study is suggestive of mild diffuse encephalopathy, nonspecific etiology. No seizures or epileptiform discharges were seen throughout the recording. Charlsie Quest   ECHOCARDIOGRAM COMPLETE  Result Date: 11/29/2020    ECHOCARDIOGRAM REPORT   Patient Name:   COBY ANTROBUS Date of Exam: 11/29/2020 Medical Rec #:  [id number]     Height:       62.0 in Accession #:    [id number]    Weight:       114.0 lb Date of Birth:  Aug 31, 1933     BSA:          1.505 m Patient Age:    87 years      BP:           157/82 mmHg Patient Gender: F  HR:           98 bpm. Exam Location:  Inpatient Procedure: 2D Echo, Cardiac Doppler, Color Doppler and Intracardiac            Opacification Agent Indications:    Stroke I63.9  History:        Patient has prior history of Echocardiogram examinations, most                 recent 11/12/2017.  Sonographer:    Leta Jungling RDCS Referring Phys: [facility]  Sonographer Comments: Technically difficult study due to poor echo windows. IMPRESSIONS  1. Left ventricular ejection fraction, by estimation, is 70 to 75%. The left ventricle has hyperdynamic function. The left ventricle has no regional wall motion abnormalities. There is moderate asymmetric left ventricular hypertrophy of the basal-septal  segment. Indeterminate diastolic filling due to E-A fusion.  2. Right ventricular systolic function is normal. The right ventricular size is normal.  3. The mitral valve is grossly normal. Mild mitral valve regurgitation. No evidence of mitral  stenosis.  4. The aortic valve was not well visualized. There is mild calcification of the aortic valve. Aortic valve regurgitation is mild to moderate. No aortic stenosis is present. Conclusion(s)/Recommendation(s): No intracardiac source of embolism detected on this transthoracic study. A transesophageal echocardiogram is recommended to exclude cardiac source of embolism if clinically indicated. FINDINGS  Left Ventricle: Left ventricular ejection fraction, by estimation, is 70 to 75%. The left ventricle has hyperdynamic function. The left ventricle has no regional wall motion abnormalities. Definity contrast agent was given IV to delineate the left ventricular endocardial borders. The left ventricular internal cavity size was normal in size. There is moderate asymmetric left ventricular hypertrophy of the basal-septal segment. Indeterminate diastolic filling due to E-A fusion. Right Ventricle: The right ventricular size is normal. No increase in right ventricular wall thickness. Right ventricular systolic function is normal. Left Atrium: Left atrial size was normal in size. Right Atrium: Right atrial size was normal in size. Pericardium: There is no evidence of pericardial effusion. Presence of pericardial fat pad. Mitral Valve: The mitral valve is grossly normal. Mild mitral valve regurgitation. No evidence of mitral valve stenosis. Tricuspid Valve: The tricuspid valve is normal in structure. Tricuspid valve regurgitation is mild . No evidence of tricuspid stenosis. Aortic Valve: The aortic valve was not well visualized. There is mild calcification of the aortic valve. Aortic valve regurgitation is mild to moderate. No aortic stenosis is present. Pulmonic Valve: The pulmonic valve was not well visualized. Pulmonic valve regurgitation is mild to moderate. No evidence of pulmonic stenosis. Aorta: The aortic root is normal in size and structure. Venous: The inferior vena cava was not well visualized. IAS/Shunts: The  interatrial septum was not well visualized.  LEFT VENTRICLE PLAX 2D LVIDd:         3.40 cm   Diastology LVIDs:         2.90 cm   LV e' medial:    5.00 cm/s LV PW:         0.90 cm   LV E/e' medial:  12.0 LV IVS:        1.30 cm   LV e' lateral:   5.66 cm/s LVOT diam:     2.10 cm   LV E/e' lateral: 10.6 LV SV:         38 LV SV Index:   25 LVOT Area:     3.46 cm  RIGHT VENTRICLE RV S prime:  16.80 cm/s LEFT ATRIUM           Index LA diam:      2.70 cm 1.79 cm/m LA Vol (A4C): 14.5 ml 9.63 ml/m  AORTIC VALVE LVOT Vmax:   72.70 cm/s LVOT Vmean:  49.000 cm/s LVOT VTI:    0.109 m  AORTA Ao Root diam: 3.30 cm Ao Asc diam:  3.20 cm MITRAL VALVE                TRICUSPID VALVE MV Area (PHT): 5.46 cm     TR Peak grad:   23.2 mmHg MV Decel Time: 139 msec     TR Vmax:        241.00 cm/s MV E velocity: 59.75 cm/s MV A velocity: 104.00 cm/s  SHUNTS MV E/A ratio:  0.57         Systemic VTI:  0.11 m                             Systemic Diam: 2.10 cm Weston Brass MD Electronically signed by Weston Brass MD Signature Date/Time: 11/29/2020/6:57:24 PM    Final    VAS US CAROTID (at [facility] and [facility] only)  Result Date: 11/29/2020 Carotid Arterial Duplex Study Patient Name:  LITISHA GUAGLIARDO  Date of Exam:   11/29/2020 Medical Rec #: [id number]      Accession #:    [id number] Date of Birth: 20-Sep-1933      Patient Gender: F Patient Age:   89 years Exam Location:  [facility] Procedure:      VAS US CAROTID Referring Phys: Terrilee Files [facility] --------------------------------------------------------------------------------  Indications:       CVA and recent falls, weakness, COVID+. Risk Factors:      Hypertension. Other Factors:     HX of ICH (2019). Comparison Study:  Previous exam 12/07/2017 1-39% BIL Performing Technologist: Ernestene Mention RVT, RDMS  Examination Guidelines: A complete evaluation includes B-mode imaging, spectral Doppler, color Doppler, and power Doppler as needed of all accessible portions of each vessel. Bilateral  testing is considered an integral part of a complete examination. Limited examinations for reoccurring indications may be performed as noted.  Right Carotid Findings: +----------+--------+--------+--------+------------------+------------------+           PSV cm/sEDV cm/sStenosisPlaque DescriptionComments           +----------+--------+--------+--------+------------------+------------------+ CCA Prox  61      9                                 intimal thickening +----------+--------+--------+--------+------------------+------------------+ CCA Distal62      14                                intimal thickening +----------+--------+--------+--------+------------------+------------------+ ICA Prox  35      10              calcific and focaltortuous           +----------+--------+--------+--------+------------------+------------------+ ICA Distal74      17                                tortuous           +----------+--------+--------+--------+------------------+------------------+ ECA       65      0  calcific                             +----------+--------+--------+--------+------------------+------------------+ +----------+--------+-------+----------------+-------------------+           PSV cm/sEDV cmsDescribe        Arm Pressure (mmHG) +----------+--------+-------+----------------+-------------------+ [id number]             Multiphasic, WNL                    +----------+--------+-------+----------------+-------------------+ +---------+--------+--+--------+--+---------+ VertebralPSV cm/s58EDV cm/s11Antegrade +---------+--------+--+--------+--+---------+  Left Carotid Findings: +----------+--------+--------+--------+------------------+---------------------+           PSV cm/sEDV cm/sStenosisPlaque DescriptionComments              +----------+--------+--------+--------+------------------+---------------------+ CCA Prox  53      11                                                       +----------+--------+--------+--------+------------------+---------------------+ CCA Distal49      14                                intimal thickening    +----------+--------+--------+--------+------------------+---------------------+ ICA Prox  41      14              calcific          intimal thickening &                                                      tortuous              +----------+--------+--------+--------+------------------+---------------------+ ICA Distal101     27                                                      +----------+--------+--------+--------+------------------+---------------------+ ECA       40      0               calcific                                +----------+--------+--------+--------+------------------+---------------------+ +----------+--------+--------+----------------+-------------------+           PSV cm/sEDV cm/sDescribe        Arm Pressure (mmHG) +----------+--------+--------+----------------+-------------------+ Subclavian102             Multiphasic, WNL                    +----------+--------+--------+----------------+-------------------+ +---------+--------+--+--------+--+---------+ VertebralPSV cm/s52EDV cm/s11Antegrade +---------+--------+--+--------+--+---------+   Summary: Right Carotid: Velocities in the right ICA are consistent with a 1-39% stenosis.                The extracranial vessels were near-normal with only minimal wall                thickening or plaque. Left Carotid: Velocities in the left ICA are consistent with a 1-39% stenosis.  The extracranial vessels were near-normal with only minimal wall               thickening or plaque. Vertebrals:  Bilateral vertebral arteries demonstrate antegrade flow. Subclavians: Normal flow hemodynamics were seen in bilateral subclavian              arteries. *See table(s) above for measurements and  observations.  Electronically signed by Sherald Hess MD on 11/29/2020 at 4:10:26 PM.    Final

## 2020-12-08 NOTE — Progress Notes (Signed)
Initial Nutrition Assessment  DOCUMENTATION CODES:   Not applicable  INTERVENTION:   Ensure Enlive po TID, each supplement provides 350 kcal and 20 grams of protein  MVI po daily   NUTRITION DIAGNOSIS:   Increased nutrient needs related to catabolic illness (COVID 19) as evidenced by estimated needs.  GOAL:   Patient will meet greater than or equal to 90% of their needs  MONITOR:   PO intake, Supplement acceptance, Labs, Weight trends, Skin, I & O's  REASON FOR ASSESSMENT:   Malnutrition Screening Tool    ASSESSMENT:   87 y.o. female with history of intracerebral hemorrhage in 2019, seizures, early dementia and essential hypertension who lives at an assisted living facility who is admitted with CVA, UTI and COVID 19.  RD working remotely.  Pt with fairly good appetite and oral intake in hospital; pt eating 50-90% of meals and reports that she is drinking the Ensure supplements. Pt seen by SLP who recommended mechanical soft/honey thick diet. Spoke with RN, pt has been drinking the Ensure and nursing is thickening it. Recommend continue supplements. RD will add MVI daily and change pt to a mechanical soft diet as pt is ordered for a GI soft diet. Per chart, pt appears weight stable at baseline. RD will obtain nfpe at follow-up.   Per chart, pt appears weight stable at baseline.   Medications reviewed and include: aspirin, D3, plavix, heparin, MVI  Labs reviewed: K 4.1 wnl Wbc- 15.5(H)  NUTRITION - FOCUSED PHYSICAL EXAM: Unable to perform at this time   Diet Order:   Diet Order             DIET SOFT Room service appropriate? No; Fluid consistency: Honey Thick  Diet effective now                  EDUCATION NEEDS:   No education needs have been identified at this time  Skin:  Skin Assessment: Reviewed RN Assessment (ecchymosis)  Last BM:  10/23- Type 3  Height:   Ht Readings from Last 1 Encounters:  11/28/20 5\' 2"  (1.575 m)    Weight:   Wt  Readings from Last 1 Encounters:  11/29/20 51.2 kg    Ideal Body Weight:  50 kg  BMI:  Body mass index is 20.65 kg/m.  Estimated Nutritional Needs:   Kcal:  1300-1500kcal/day  Protein:  65-75g/day  Fluid:  1.3-1.5L/day  12/01/20 MS, RD, LDN Please refer to [facility] for RD and/or RD on-call/weekend/after hours pager

## 2020-12-08 NOTE — Progress Notes (Signed)
PROGRESS NOTE                                                                                                                                                                                                             Patient Demographics:    Ann Johns, is a 87 y.o. female, DOB - 03-08-1933, EAV:[id number]  Outpatient Primary MD for the patient is Lewis Moccasin, MD    LOS - 10  Admit date - 11/28/2020    Chief Complaint  Patient presents with   Fall   Altered Mental Status       Brief Narrative (HPI from H&P)     - Ann Johns  is a 87 y.o. female, with history of intracerebral hemorrhage in 2019, seizures, early dementia, essential hypertension who lives at an assisted living facility was brought in by her son to the hospital for evaluation.  According to the son she was in today morning around 9 AM when she had a fall at assisted living facility, she subsequently did well however she was feeling weak the whole day, this morning she had another 4 at assisted living facility after which she was brought to the [facility] ER where she was found to have an acute stroke, UTI and a COVID-19 infection.  Neurology was consulted .   Subjective:   Patient still complains of mild cough, but no dyspnea, no fever, no chills, no significant events as discussed with staff, patient is drinking her Ensure.     Assessment  & Plan :     Left PCA  - CVA in the Subcortical left parietooccipital lobes and callosal splenium - causing x 2 falls, starting over 36 hrs before admission - she thankfully has mild deficits, Neuro following, ASA - Statin for now, Plavix x 3 weeks, PT - OT - SLP, will need SNF. Await her to clear quarantine. -She remains on honey thick, reassessed again by SLP on 10/22, with diet recommendation remain dysphagia 3 with honey thick liquid.     UTI  - Rocephin x 3, negative cultures.   COVID infection.   -She  has had 2 doses of vaccine so far, she did have a dry cough, moderate inflammatory markers finished short course of Decadron IV and Remdesivir - resolved clinically.   Hypertension.  - Allow for permissive hypertension since >  3 days, low dose Lopressor added on 12/03/20   Dementia. -   At risk for delirium.  Continue Namenda at home dose, minimize benzodiazepines and narcotics, son warned about possible worsening delirium while in the hospital. -He was encouraged to drink Ensure today, as well she was encouraged to get out of bed to chair with staff assistance.   SpO2: 94 %  Recent Labs  Lab 12/06/20 0832  WBC 15.5*  HGB 12.2  HCT 36.9  PLT 370           Condition - Extremely Guarded  Family Communication  :  none at [place]   Code Status :  Full  Consults  :  Neuro  PUD Prophylaxis :    Procedures  :     MRI - Moderate-sized acute/early subacute left PCA territory infarct affecting the cortical/subcortical left parietooccipital lobes and callosal splenium. Redemonstrated chronic encephalomalacia and associated chronic blood products in the anterior frontal lobes, and anterior right temporal lobe, presumably posttraumatic in etiology. Chronic small-vessel ischemic changes which are severe in the cerebral white matter, and mild in the pons. Mild-to-moderate generalized cerebral atrophy. Comparatively mild cerebellar atrophy. Paranasal sinus disease  MRA - 1. No emergent large vessel occlusion or high-grade stenosis. 2. Mild narrowing of the right PCA P2 segment.    EEG - non acute  Carotid US - stable.  TTE - 1. Left ventricular ejection fraction, by estimation, is 70 to 75%. The left ventricle has hyperdynamic function. The left ventricle has no regional wall motion abnormalities. There is moderate asymmetric left ventricular hypertrophy of the basal-septal  segment. Indeterminate diastolic filling due to E-A fusion.  2. Right ventricular systolic function is normal.  The right ventricular size is normal.  3. The mitral valve is grossly normal. Mild mitral valve regurgitation. No evidence of mitral stenosis.  4. The aortic valve was not well visualized. There is mild calcification of the aortic valve. Aortic valve regurgitation is mild to moderate. No aortic stenosis is present      Disposition Plan  :    Status is: Inpatient -patient is stable for this discharge, awaiting till she finished her COVID quarantine before placement at facility.  DVT Prophylaxis  :    heparin injection 5,000 Units Start: 11/28/20 1715    Lab Results  Component Value Date   PLT 370 12/06/2020    Diet :  Diet Order             DIET SOFT Room service appropriate? No; Fluid consistency: Honey Thick  Diet effective now                    Inpatient Medications  Scheduled Meds:  aspirin  81 mg Oral Daily   atorvastatin  40 mg Oral Daily   cholecalciferol  75 mcg Oral Daily   clopidogrel  75 mg Oral Daily   dextromethorphan-guaiFENesin  1 tablet Oral BID   feeding supplement  237 mL Oral TID BM   heparin  5,000 Units Subcutaneous Q8H   mouth rinse  15 mL Mouth Rinse BID   memantine  10 mg Oral BID   metoprolol tartrate  25 mg Oral BID   multivitamin with minerals  1 tablet Oral Daily   Continuous Infusions:   PRN Meds:.acetaminophen **OR** acetaminophen, bisacodyl, haloperidol lactate, ondansetron **OR** ondansetron (ZOFRAN) IV  Antibiotics  :    Anti-infectives (From admission, onward)    Start     Dose/Rate Route Frequency Ordered Stop  11/29/20 1000  remdesivir 100 mg in sodium chloride 0.9 % 100 mL IVPB  Status:  Discontinued       See Hyperspace for full Linked Orders Report.   100 mg 200 mL/hr over 30 Minutes Intravenous Daily 11/28/20 1743 11/30/20 1242   11/28/20 1900  remdesivir 200 mg in sodium chloride 0.9% 250 mL IVPB       See Hyperspace for full Linked Orders Report.   200 mg 580 mL/hr over 30 Minutes Intravenous Once 11/28/20 1743  11/28/20 2232   11/28/20 1715  cefTRIAXone (ROCEPHIN) 1 g in sodium chloride 0.9 % 100 mL IVPB        1 g 200 mL/hr over 30 Minutes Intravenous Every 24 hours 11/28/20 1700 11/30/20 1625   11/28/20 1330  cephALEXin (KEFLEX) capsule 500 mg  Status:  Discontinued        500 mg Oral Every 8 hours 11/28/20 1328 11/28/20 1700         Hosam Mcfetridge M.D on 12/08/2020 at 12:45 PM  To page go to [email or web address]   [facility] -  Office  [phone]  See all Orders from today for further details    Objective:   Vitals:   12/07/20 2356 12/08/20 0328 12/08/20 0740 12/08/20 1207  BP: 119/71 124/86 102/88 119/67  Pulse: 74 88 86 (!) 109  Resp: 20 17 14 15   Temp: 97.8 F (36.6 C) 98.1 F (36.7 C)    TempSrc: Oral Oral    SpO2: 96% 96% 98% 94%  Weight:      Height:        Wt Readings from Last 3 Encounters:  11/29/20 51.2 kg  10/03/20 51.7 kg  04/02/20 54 kg     Intake/Output Summary (Last 24 hours) at 12/08/2020 1245 Last data filed at 12/08/2020 12/10/2020 Gross per 24 hour  Intake --  Output 500 ml  Net -500 ml     Physical Exam  Awake Alert, she is pleasant, demented, frail. Symmetrical Chest wall movement, Good air movement bilaterally, CTAB RRR,No Gallops,Rubs or new Murmurs, No Parasternal Heave +ve B.Sounds, Abd Soft, No tenderness, No rebound - guarding or rigidity. No Cyanosis, Clubbing or edema, No new Rash or bruise           Data Review:    CBC Recent Labs  Lab 12/06/20 0832  WBC 15.5*  HGB 12.2  HCT 36.9  PLT 370  MCV 92.3  MCH 30.5  MCHC 33.1  RDW 12.6    Recent Labs  Lab 12/06/20 0832  NA 138  K 4.1  CL 103  CO2 26  GLUCOSE 128*  BUN 16  CREATININE 0.58  CALCIUM 9.0    ------------------------------------------------------------------------------------------------------------------ No results for input(s): CHOL, HDL, LDLCALC, TRIG, CHOLHDL, LDLDIRECT in the last 72 hours.   Lab Results  Component Value Date    HGBA1C 5.7 (H) 11/29/2020   ------------------------------------------------------------------------------------------------------------------ No results for input(s): TSH, T4TOTAL, T3FREE, THYROIDAB in the last 72 hours.  Invalid input(s): FREET3  Cardiac Enzymes No results for input(s): CKMB, TROPONINI, MYOGLOBIN in the last 168 hours.  Invalid input(s): CK ------------------------------------------------------------------------------------------------------------------    Component Value Date/Time   BNP 88.1 12/01/2020 0359     Radiology Reports DG Chest 1 View  Result Date: 11/28/2020 CLINICAL DATA:  Stroke.  Pre MRI metal screening EXAM: CHEST  1 VIEW; PELVIS - 1-2 VIEW; ABDOMEN - 1 VIEW COMPARISON:  None. FINDINGS: The heart and mediastinal contours are within normal limits. Aortic calcification. Likely eventration of the right  hemidiaphragm. No focal consolidation. No pulmonary edema. No pleural effusion. No pneumothorax. Nonobstructive bowel gas pattern. No acute osseous abnormality. Multilevel degenerative changes of the thoracolumbar spine. No retained radiopaque foreign body. IMPRESSION: 1. No retained radiopaque foreign body. 2. No acute cardiopulmonary disease. 3. Nonobstructive bowel gas pattern. 4.  Aortic Atherosclerosis (ICD10-I70.0). Electronically Signed   By: Tish Frederickson M.D.   On: 11/28/2020 15:19   DG Pelvis 1-2 Views  Result Date: 11/28/2020 CLINICAL DATA:  Stroke.  Pre MRI metal screening EXAM: CHEST  1 VIEW; PELVIS - 1-2 VIEW; ABDOMEN - 1 VIEW COMPARISON:  None. FINDINGS: The heart and mediastinal contours are within normal limits. Aortic calcification. 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 No focal consolidation. No pulmonary edema. No pleural effusion. No pneumothorax. Nonobstructive bowel gas pattern. No acute osseous abnormality. Multilevel degenerative changes of the thoracolumbar spine. No retained radiopaque foreign body. IMPRESSION: 1. No  retained radiopaque foreign body. 2. No acute cardiopulmonary disease. 3. Nonobstructive bowel gas pattern. 4.  Aortic Atherosclerosis (ICD10-I70.0). Electronically Signed   By: Tish Frederickson M.D.   On: 11/28/2020 15:19   DG Abd 1 View  Result Date: 11/28/2020 CLINICAL DATA:  Stroke.  Pre MRI metal screening EXAM: CHEST  1 VIEW; PELVIS - 1-2 VIEW; ABDOMEN - 1 VIEW COMPARISON:  None. FINDINGS: The heart and mediastinal contours are within normal limits. Aortic calcification. 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 No focal consolidation. No pulmonary edema. No pleural effusion. No pneumothorax. Nonobstructive bowel gas pattern. No acute osseous abnormality. Multilevel degenerative changes of the thoracolumbar spine. No retained radiopaque foreign body. IMPRESSION: 1. No retained radiopaque foreign body. 2. No acute cardiopulmonary disease. 3. Nonobstructive bowel gas pattern. 4.  Aortic Atherosclerosis (ICD10-I70.0). Electronically Signed   By: Tish Frederickson M.D.   On: 11/28/2020 15:19   CT HEAD WO CONTRAST ( )  Result Date: 11/28/2020 CLINICAL DATA:  Head trauma, minor (Age >= 65y) hx of dementia with repeated falls recently, hematoma near right eye, evaluate for ICH EXAM: CT HEAD WITHOUT CONTRAST TECHNIQUE: Contiguous axial images were obtained from the base of the skull through the vertex without intravenous contrast. COMPARISON:  MRI 04/16/2020, CT 12/24/2018 FINDINGS: Brain: New area of low attenuation in the anteroinferior left frontal lobe with loss of gray-white differentiation and developing encephalomalacia is new from prior and likely represents a late subacute or chronic left ACA territory infarction. Chronic right anterior frontal lobe infarct is unchanged. No intracranial hemorrhage. No extra-axial collection. Stable size and configuration of the ventricles. No mass lesion or mass effect. Extensive low-density changes within the periventricular and subcortical white matter  compatible with chronic microvascular ischemic change. Mild diffuse cerebral volume loss. Vascular: Atherosclerotic calcifications involving the large vessels of the skull base. No unexpected hyperdense vessel. Skull: Normal. Negative for fracture or focal lesion. Sinuses/Orbits: Mucosal thickening and partial opacification of the right maxillary sinus. There is extensive mucosal thickening within the bilateral ethmoid air cells. Orbital structures within normal limits. Other: Right periorbital soft tissue swelling. IMPRESSION: 1. Low-attenuation changes in the left frontal lobe is new from prior and likely represents a late subacute to chronic left ACA territory infarction. Further evaluation with MRI of the brain is recommended. 2. No intracranial hemorrhage identified. 3. Chronic right anterior frontal lobe infarct is unchanged. 4. Right periorbital soft tissue swelling. 5. Paranasal sinus disease as described. Electronically Signed   By: Duanne Guess D.O.   On: 11/28/2020 12:58   MR ANGIO HEAD WO CONTRAST  Result Date: 11/28/2020 CLINICAL  DATA:  Acute neurologic deficit EXAM: MRA HEAD WITHOUT CONTRAST TECHNIQUE: Angiographic images of the Circle of Willis were acquired using MRA technique without intravenous contrast. COMPARISON:  No pertinent prior exam. FINDINGS: POSTERIOR CIRCULATION: --Vertebral arteries: Normal --Inferior cerebellar arteries: Normal. --Basilar artery: Normal. --Superior cerebellar arteries: Normal. --Posterior cerebral arteries: Mild narrowing of the right PCA P2 segment. Normal left. ANTERIOR CIRCULATION: --Intracranial internal carotid arteries: Normal. --Anterior cerebral arteries (ACA): Normal. --Middle cerebral arteries (MCA): Normal. ANATOMIC VARIANTS: None IMPRESSION: 1. No emergent large vessel occlusion or high-grade stenosis. 2. Mild narrowing of the right PCA P2 segment. Electronically Signed   By: Deatra Robinson M.D.   On: 11/28/2020 23:20   MR BRAIN WO  CONTRAST  Result Date: 11/28/2020 CLINICAL DATA:  Stroke, follow-up; CT head with question of evolving or new subacute ACA infarct, patient with history of dementia and prior CVA, follow-up imaging. EXAM: MRI HEAD WITHOUT CONTRAST TECHNIQUE: Multiplanar, multiecho pulse sequences of the brain and surrounding structures were obtained without intravenous contrast. COMPARISON:  Prior head CT 11/28/2020.  Brain MRI 04/16/2020. FINDINGS: Brain: Mild to moderate generalized cerebral atrophy. Comparatively mild cerebellar atrophy. Moderate-sized acute/early subacute infarct within the cortical/subcortical left parietooccipital lobes and within the callosal splenium (PCA vascular territory). Redemonstrated chronic foci of encephalomalacia within the anterior frontal lobes and anterior right temporal lobe, with associated chronic blood products. Background advanced patchy and confluent T2 FLAIR hyperintense signal abnormality within the cerebral white matter, nonspecific but compatible with chronic small vessel ischemic disease. Mild chronic small vessel ischemic changes are also present within the pons. No evidence of an intracranial mass. No extra-axial fluid collection. No midline shift. Partially empty sella turcica. Vascular: No definite loss of expected proximal arterial flow voids. Skull and upper cervical spine: No focal suspicious marrow lesion. Incompletely assessed cervical spondylosis. Sinuses/Orbits: Visualized orbits show no acute finding. Mild-to-moderate mucosal thickening within the bilateral ethmoid and right maxillary sinuses. Superimposed small volume frothy secretions within the right maxillary sinus. Mild mucosal thickening also present within the bilateral sphenoid and left maxillary sinuses. Other: 13 mm left parietal scalp lesion, nonspecific but possibly reflecting a sebaceous cyst. IMPRESSION: Moderate-sized acute/early subacute left PCA territory infarct affecting the cortical/subcortical left  parietooccipital lobes and callosal splenium. Redemonstrated chronic encephalomalacia and associated chronic blood products in the anterior frontal lobes, and anterior right temporal lobe, presumably posttraumatic in etiology. Chronic small-vessel ischemic changes which are severe in the cerebral white matter, and mild in the pons. Mild-to-moderate generalized cerebral atrophy. Comparatively mild cerebellar atrophy. Paranasal sinus disease, as described. Electronically Signed   By: Jackey Loge D.O.   On: 11/28/2020 16:02   DG Chest Port 1 View  Result Date: 11/29/2020 CLINICAL DATA:  Shortness of breath.  Altered mental status. EXAM: PORTABLE CHEST 1 VIEW COMPARISON:  11/28/2020 FINDINGS: The patient is rotated to the right with grossly unchanged cardiomediastinal silhouette. Eventration of the right hemidiaphragm is again noted. No confluent airspace opacity, edema, sizable pleural effusion, or pneumothorax is identified. Thoracic levoscoliosis is noted. IMPRESSION: No active disease. Electronically Signed   By: Sebastian Ache M.D.   On: 11/29/2020 05:31   DG Swallowing Func-Speech Pathology  Result Date: 12/03/2020 Table formatting from the original result was not included. Objective Swallowing Evaluation: Type of Study: MBS-Modified Barium Swallow Study  Patient Details Name: Shatana Saxton MRN: [id number] Date of Birth: [birth year] Today's Date: 12/03/2020 Time: SLP Start Time (ACUTE ONLY): 1516 -SLP Stop Time (ACUTE ONLY): 1540 SLP Time Calculation (min) (ACUTE ONLY): 24 min Past Medical  History: Past Medical History: Diagnosis Date  Cerebrovascular disease 10/03/2020  ICH (intracerebral hemorrhage) (HCC) 11/01/2017  Right frontal  Memory change 10/03/2020  Seizures (HCC) 11/01/2017 Past Surgical History: Past Surgical History: Procedure Laterality Date  VAGINAL HYSTERECTOMY   HPI: 87 yo female who presented on 10/13, brought to hospital by her son from her ALF after weakness and multiple falls. Found to have  L PCA territory CVA, acute UTI, and was Covid +. PMH ICH right frontal lobe 2019, seizures, dementia, HTN.  Subjective: Pt was pleasant and cooperative with son at bedside Assessment / Plan / Recommendation CHL IP CLINICAL IMPRESSIONS 12/03/2020 Clinical Impression Pt demonstrated moderate oropharyngeal dysphagia with aspiration present. Oral delays, decreased cohesion and reduced manipulation describe her oral phase. Pt's epiglottis did not fully invert during all swallows and timing of laryngeal closure was late intermittently leading to penetration to vocal cords (PAS 5) and aspiration with straw (PAS 8). Material also penetrated and aspirated during multiple swallows. Reduced lingual retraction led to vallecular residue and pyriform residue present with mild increase in volume after thicker consistencies. Given pt's cognitive state, compensatory techniques were not attempted. Recommend continue soft texture and upgrade to honey thick liquids, multiple swallows, throat clear intermittently, crush pills and full supervision. SLP Visit Diagnosis Dysphagia, oropharyngeal phase (R13.12) Attention and concentration deficit following -- Frontal lobe and executive function deficit following -- Impact on safety and function Moderate aspiration risk;Severe aspiration risk   CHL IP TREATMENT RECOMMENDATION 12/03/2020 Treatment Recommendations Therapy as outlined in treatment plan below   Prognosis 12/03/2020 Prognosis for Safe Diet Advancement Fair Barriers to Reach Goals Cognitive deficits Barriers/Prognosis Comment -- CHL IP DIET RECOMMENDATION 12/03/2020 SLP Diet Recommendations Honey thick liquids;Other (Comment) Liquid Administration via Cup Medication Administration Crushed with puree Compensations Minimize environmental distractions;Slow rate;Small sips/bites;Multiple dry swallows after each bite/sip;Clear throat intermittently Postural Changes Seated upright at 90 degrees   CHL IP OTHER RECOMMENDATIONS 12/03/2020  Recommended Consults -- Oral Care Recommendations Oral care BID Other Recommendations --   CHL IP FOLLOW UP RECOMMENDATIONS 12/03/2020 Follow up Recommendations Skilled Nursing facility   [facility] IP FREQUENCY AND DURATION 12/03/2020 Speech Therapy Frequency (ACUTE ONLY) min 2x/week Treatment Duration 2 weeks      CHL IP ORAL PHASE 12/03/2020 Oral Phase Impaired Oral - Pudding Teaspoon -- Oral - Pudding Cup -- Oral - Honey Teaspoon -- Oral - Honey Cup Delayed oral transit Oral - Nectar Teaspoon -- Oral - Nectar Cup Delayed oral transit Oral - Nectar Straw -- Oral - Thin Teaspoon -- Oral - Thin Cup Decreased bolus cohesion;Lingual/palatal residue Oral - Thin Straw Decreased bolus cohesion Oral - Puree Delayed oral transit Oral - Mech Soft Weak lingual manipulation Oral - Regular Impaired mastication Oral - Multi-Consistency -- Oral - Pill -- Oral Phase - Comment --  CHL IP PHARYNGEAL PHASE 12/03/2020 Pharyngeal Phase Impaired Pharyngeal- Pudding Teaspoon -- Pharyngeal -- Pharyngeal- Pudding Cup -- Pharyngeal -- Pharyngeal- Honey Teaspoon -- Pharyngeal -- Pharyngeal- Honey Cup -- Pharyngeal -- Pharyngeal- Nectar Teaspoon -- Pharyngeal -- Pharyngeal- Nectar Cup -- Pharyngeal -- Pharyngeal- Nectar Straw -- Pharyngeal -- Pharyngeal- Thin Teaspoon -- Pharyngeal -- Pharyngeal- Thin Cup Reduced epiglottic inversion;Pharyngeal residue - valleculae;Pharyngeal residue - pyriform;Penetration/Aspiration during swallow;Reduced airway/laryngeal closure Pharyngeal Material enters airway, remains ABOVE vocal cords then ejected out;Material enters airway, remains ABOVE vocal cords and not ejected out Pharyngeal- Thin Straw Penetration/Aspiration during swallow;Pharyngeal residue - valleculae;Pharyngeal residue - pyriform Pharyngeal Material enters airway, CONTACTS cords and not ejected out;Material enters airway, passes BELOW cords without  attempt by patient to eject out (silent aspiration) Pharyngeal- Puree Pharyngeal residue -  valleculae Pharyngeal -- Pharyngeal- Mechanical Soft Pharyngeal residue - valleculae Pharyngeal -- Pharyngeal- Regular Pharyngeal residue - valleculae Pharyngeal -- Pharyngeal- Multi-consistency -- Pharyngeal -- Pharyngeal- Pill -- Pharyngeal -- Pharyngeal Comment --  CHL IP CERVICAL ESOPHAGEAL PHASE 12/03/2020 Cervical Esophageal Phase WFL Pudding Teaspoon -- Pudding Cup -- Honey Teaspoon -- Honey Cup -- Nectar Teaspoon -- Nectar Cup -- Nectar Straw -- Thin Teaspoon -- Thin Cup -- Thin Straw -- Puree -- Mechanical Soft -- Regular -- Multi-consistency -- Pill -- Cervical Esophageal Comment -- Royce Macadamia 12/03/2020, 4:57 PM              EEG adult  Result Date: 11/29/2020 Charlsie Quest, MD     11/29/2020 12:33 PM Patient Name: Kareen Jefferys MRN: [id number] Epilepsy Attending: Charlsie Quest Referring Physician/Provider: Janey Genta, NP Date: 11/29/2020 Duration: 25.17 mins Patient history: 87yo F with ams. EEG to evaluate for seizure Level of alertness: Awake, asleep AEDs during EEG study: None Technical aspects: This EEG study was done with scalp electrodes positioned according to the 10-20 International system of electrode placement. Electrical activity was acquired at a sampling rate of  and reviewed with a high frequency filter of  and a low frequency filter of . EEG data were recorded continuously and digitally stored. Description: The posterior dominant rhythm consists of 7.5 Hz activity of moderate voltage (25-35 uV) seen predominantly in posterior head regions, symmetric and reactive to eye opening and eye closing. Sleep was characterized by vertex waves, sleep spindles (12 to 14 Hz), maximal frontocentral region. EEG showed intermittent generalized 3 to 6 Hz theta-delta slowing. Hyperventilation and photic stimulation were not performed.   ABNORMALITY - Intermittent slow, generalized IMPRESSION: This study is suggestive of mild diffuse encephalopathy, nonspecific  etiology. No seizures or epileptiform discharges were seen throughout the recording. Charlsie Quest   ECHOCARDIOGRAM COMPLETE  Result Date: 11/29/2020    ECHOCARDIOGRAM REPORT   Patient Name:   AMAAL DIMARTINO Date of Exam: 11/29/2020 Medical Rec #:  [id number]     Height:       62.0 in Accession #:    [id number]    Weight:       114.0 lb Date of Birth:  04/13/1933     BSA:          1.505 m Patient Age:    87 years      BP:           157/82 mmHg Patient Gender: F             HR:           98 bpm. Exam Location:  Inpatient Procedure: 2D Echo, Cardiac Doppler, Color Doppler and Intracardiac            Opacification Agent Indications:    Stroke I63.9  History:        Patient has prior history of Echocardiogram examinations, most                 recent 11/12/2017.  Sonographer:    Leta Jungling RDCS Referring Phys: Heide Scales [facility]  Sonographer Comments: Technically difficult study due to poor echo windows. IMPRESSIONS  1. Left ventricular ejection fraction, by estimation, is 70 to 75%. The left ventricle has hyperdynamic function. The left ventricle has no regional wall motion abnormalities. There is moderate asymmetric left ventricular hypertrophy of the basal-septal  segment. Indeterminate diastolic filling  due to E-A fusion.  2. Right ventricular systolic function is normal. The right ventricular size is normal.  3. The mitral valve is grossly normal. Mild mitral valve regurgitation. No evidence of mitral stenosis.  4. The aortic valve was not well visualized. There is mild calcification of the aortic valve. Aortic valve regurgitation is mild to moderate. No aortic stenosis is present. Conclusion(s)/Recommendation(s): No intracardiac source of embolism detected on this transthoracic study. A transesophageal echocardiogram is recommended to exclude cardiac source of embolism if clinically indicated. FINDINGS  Left Ventricle: Left ventricular ejection fraction, by estimation, is 70 to 75%. The left ventricle  has hyperdynamic function. The left ventricle has no regional wall motion abnormalities. Definity contrast agent was given IV to delineate the left ventricular endocardial borders. The left ventricular internal cavity size was normal in size. There is moderate asymmetric left ventricular hypertrophy of the basal-septal segment. Indeterminate diastolic filling due to E-A fusion. Right Ventricle: The right ventricular size is normal. No increase in right ventricular wall thickness. Right ventricular systolic function is normal. Left Atrium: Left atrial size was normal in size. Right Atrium: Right atrial size was normal in size. Pericardium: There is no evidence of pericardial effusion. Presence of pericardial fat pad. Mitral Valve: The mitral valve is grossly normal. Mild mitral valve regurgitation. No evidence of mitral valve stenosis. Tricuspid Valve: The tricuspid valve is normal in structure. Tricuspid valve regurgitation is mild . No evidence of tricuspid stenosis. Aortic Valve: The aortic valve was not well visualized. There is mild calcification of the aortic valve. Aortic valve regurgitation is mild to moderate. No aortic stenosis is present. Pulmonic Valve: The pulmonic valve was not well visualized. Pulmonic valve regurgitation is mild to moderate. No evidence of pulmonic stenosis. Aorta: The aortic root is normal in size and structure. Venous: The inferior vena cava was not well visualized. IAS/Shunts: The interatrial septum was not well visualized.  LEFT VENTRICLE PLAX 2D LVIDd:         3.40 cm   Diastology LVIDs:         2.90 cm   LV e' medial:    5.00 cm/s LV PW:         0.90 cm   LV E/e' medial:  12.0 LV IVS:        1.30 cm   LV e' lateral:   5.66 cm/s LVOT diam:     2.10 cm   LV E/e' lateral: 10.6 LV SV:         38 LV SV Index:   25 LVOT Area:     3.46 cm  RIGHT VENTRICLE RV S prime:     16.80 cm/s LEFT ATRIUM           Index LA diam:      2.70 cm 1.79 cm/m LA Vol (A4C): 14.5 ml 9.63 ml/m  AORTIC  VALVE LVOT Vmax:   72.70 cm/s LVOT Vmean:  49.000 cm/s LVOT VTI:    0.109 m  AORTA Ao Root diam: 3.30 cm Ao Asc diam:  3.20 cm MITRAL VALVE                TRICUSPID VALVE MV Area (PHT): 5.46 cm     TR Peak grad:   23.2 mmHg MV Decel Time: 139 msec     TR Vmax:        241.00 cm/s MV E velocity: 59.75 cm/s MV A velocity: 104.00 cm/s  SHUNTS MV E/A ratio:  0.57  Systemic VTI:  0.11 m                             Systemic Diam: 2.10 cm Weston Brass MD Electronically signed by Weston Brass MD Signature Date/Time: 11/29/2020/6:57:24 PM    Final    VAS US CAROTID (at [facility] and [facility] only)  Result Date: 11/29/2020 Carotid Arterial Duplex Study Patient Name:  GUSTA MARKSBERRY  Date of Exam:   11/29/2020 Medical Rec #: [id number]      Accession #:    [id number] Date of Birth: [birth year]      Patient Gender: F Patient Age:   66 years Exam Location:  [facility] Procedure:      VAS US CAROTID Referring Phys: Terrilee Files [facility] --------------------------------------------------------------------------------  Indications:       CVA and recent falls, weakness, COVID+. Risk Factors:      Hypertension. Other Factors:     HX of ICH (2019). Comparison Study:  Previous exam 12/07/2017 1-39% BIL Performing Technologist: Ernestene Mention RVT, RDMS  Examination Guidelines: A complete evaluation includes B-mode imaging, spectral Doppler, color Doppler, and power Doppler as needed of all accessible portions of each vessel. Bilateral testing is considered an integral part of a complete examination. Limited examinations for reoccurring indications may be performed as noted.  Right Carotid Findings: +----------+--------+--------+--------+------------------+------------------+           PSV cm/sEDV cm/sStenosisPlaque DescriptionComments           +----------+--------+--------+--------+------------------+------------------+ CCA Prox  61      9                                 intimal thickening  +----------+--------+--------+--------+------------------+------------------+ CCA Distal62      14                                intimal thickening +----------+--------+--------+--------+------------------+------------------+ ICA Prox  35      10              calcific and focaltortuous           +----------+--------+--------+--------+------------------+------------------+ ICA Distal74      17                                tortuous           +----------+--------+--------+--------+------------------+------------------+ ECA       65      0               calcific                             +----------+--------+--------+--------+------------------+------------------+ +----------+--------+-------+----------------+-------------------+           PSV cm/sEDV cmsDescribe        Arm Pressure (mmHG) +----------+--------+-------+----------------+-------------------+ [id number]             Multiphasic, WNL                    +----------+--------+-------+----------------+-------------------+ +---------+--------+--+--------+--+---------+ VertebralPSV cm/s58EDV cm/s11Antegrade +---------+--------+--+--------+--+---------+  Left Carotid Findings: +----------+--------+--------+--------+------------------+---------------------+           PSV cm/sEDV cm/sStenosisPlaque DescriptionComments              +----------+--------+--------+--------+------------------+---------------------+ CCA Prox  53  11                                                      +----------+--------+--------+--------+------------------+---------------------+ CCA Distal49      14                                intimal thickening    +----------+--------+--------+--------+------------------+---------------------+ ICA Prox  41      14              calcific          intimal thickening &                                                      tortuous               +----------+--------+--------+--------+------------------+---------------------+ ICA Distal101     27                                                      +----------+--------+--------+--------+------------------+---------------------+ ECA       40      0               calcific                                +----------+--------+--------+--------+------------------+---------------------+ +----------+--------+--------+----------------+-------------------+           PSV cm/sEDV cm/sDescribe        Arm Pressure (mmHG) +----------+--------+--------+----------------+-------------------+ Subclavian102             Multiphasic, WNL                    +----------+--------+--------+----------------+-------------------+ +---------+--------+--+--------+--+---------+ VertebralPSV cm/s52EDV cm/s11Antegrade +---------+--------+--+--------+--+---------+   Summary: Right Carotid: Velocities in the right ICA are consistent with a 1-39% stenosis.                The extracranial vessels were near-normal with only minimal wall                thickening or plaque. Left Carotid: Velocities in the left ICA are consistent with a 1-39% stenosis.               The extracranial vessels were near-normal with only minimal wall               thickening or plaque. Vertebrals:  Bilateral vertebral arteries demonstrate antegrade flow. Subclavians: Normal flow hemodynamics were seen in bilateral subclavian              arteries. *See table(s) above for measurements and observations.  Electronically signed by Sherald Hess MD on 11/29/2020 at 4:10:26 PM.    Final

## 2020-12-09 MED ORDER — METOPROLOL TARTRATE 25 MG PO TABS: 25.0000 mg | ORAL_TABLET | Freq: Two times a day (BID) | ORAL | Status: DC

## 2020-12-09 MED ORDER — ASPIRIN 81 MG PO CHEW: 81.0000 mg | CHEWABLE_TABLET | Freq: Every day | ORAL | Status: AC

## 2020-12-09 MED ORDER — CLOPIDOGREL BISULFATE 75 MG PO TABS: 75.0000 mg | ORAL_TABLET | Freq: Every day | ORAL | 0 refills | Status: AC

## 2020-12-09 MED ORDER — ATORVASTATIN CALCIUM 40 MG PO TABS: 40.0000 mg | ORAL_TABLET | Freq: Every day | ORAL | Status: DC

## 2020-12-09 MED ORDER — BISACODYL 5 MG PO TBEC: 5.0000 mg | DELAYED_RELEASE_TABLET | Freq: Every day | ORAL | 0 refills | Status: DC | PRN

## 2020-12-09 MED ORDER — ACETAMINOPHEN 325 MG PO TABS: 650.0000 mg | ORAL_TABLET | Freq: Four times a day (QID) | ORAL | Status: DC | PRN

## 2020-12-09 MED ORDER — ENSURE ENLIVE PO LIQD: 237.0000 mL | Freq: Three times a day (TID) | ORAL | 12 refills | Status: DC

## 2020-12-09 NOTE — TOC Transition Note (Addendum)
Transition of Care [facility]) - CM/SW Discharge Note   Patient Details  Name: Ann Johns MRN: [id number] Date of Birth: Oct 02, 1933  [facility] CM/SW Contact:  Baldemar Lenis, LCSW Phone Number: 12/09/2020, 10:53 AM   Clinical Narrative:   Nurse to call report to [phone], Room 302.   Family to arrive at 1:30 to provide transport, CSW notified RN.    Final next level of care: Skilled Nursing Facility Barriers to Discharge: Barriers Resolved   Patient Goals and CMS Choice Patient states their goals for this hospitalization and ongoing recovery are:: patient unable to participate in goal setting, only oriented to self CMS Medicare.gov Compare Post Acute Care list provided to:: Patient Represenative (must comment) Choice offered to / list presented to : Adult Children  Discharge Placement              Patient chooses bed at: [facility] Patient to be transferred to facility by: Family Name of family member notified: Feliberto Harts Patient and family notified of of transfer: 12/09/20  Discharge Plan and Services     Post Acute Care Choice: Skilled Nursing Facility                               Social Determinants of Health (SDOH) Interventions     Readmission Risk Interventions No flowsheet data found.

## 2020-12-09 NOTE — Progress Notes (Signed)
Physical Therapy Treatment Patient Details Name: Ann Johns MRN: [id number] DOB: Jul 18, 1933 Today's Date: 12/09/2020   History of Present Illness 87 yo female who presented on 10/13, brought to hospital by her son from her ALF after weakness and multiple falls. 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 PMH: ICH right frontal lobe 2019, seizures, dementia, HTN.    PT Comments    Pt received in supine, oriented to self and hospital but otherwise tangential and requiring frequent reorientation to task/situation. Pt with good participation and tolerance for household distance gait task, requiring up to minA for transfers and gait, with poor management of RW and poor posture as well as slow gait speed indicating increased risk of falls. Emphasis on seated balance tasks and safety with transfers, use of assistive device and fall risk prevention. Pt continues to benefit from PT services to progress toward functional mobility goals. Chair alarm activated for safety at end of session and RN aware pt may need help/reminders for eating her lunch.  Recommendations for follow up therapy are one component of a multi-disciplinary discharge planning process, led by the attending physician.  Recommendations may be updated based on patient status, additional functional criteria and insurance authorization.  Follow Up Recommendations  Skilled nursing-short term rehab (<3 hours/day)     Assistance Recommended at Discharge Frequent or constant Supervision/Assistance  Equipment Recommendations  Rolling walker (2 wheels);3in1 (PT);Wheelchair (measurements PT);Wheelchair cushion (measurements PT)    Recommendations for Other Services       Precautions / Restrictions Precautions Precautions: Fall;Other (comment) Precaution Comments: acute PCA territory CVA, hx R frontal ICH, watch HR Restrictions Weight Bearing Restrictions: No     Mobility  Bed Mobility Overal bed mobility: Needs  Assistance Bed Mobility: Supine to Sit     Supine to sit: Min guard;HOB elevated     General bed mobility comments: multimodal cues to initiate and increased time    Transfers Overall transfer level: Needs assistance Equipment used: Rolling walker (2 wheels) Transfers: Sit to/from Stand Sit to Stand: Min assist           General transfer comment: Hand over hand minA for safety/hand placement and sequencing and min guard for lift assist    Ambulation/Gait Ambulation/Gait assistance: Min assist Gait Distance (Feet): 75 Feet Assistive device: Rolling walker (2 wheels) Gait Pattern/deviations: Step-through pattern;Decreased step length - right;Decreased step length - left;Trunk flexed Gait velocity: decreased Gait velocity interpretation: <1.31 ft/sec, indicative of household ambulator General Gait Details: Needed dense cues for walker placement and to maintain proximity to device; inconsistent foot placement and decreased stability while turning needing up to minA for posture and heavy postural cues    Modified Rankin (Stroke Patients Only) Modified Rankin (Stroke Patients Only) Pre-Morbid Rankin Score: Slight disability Modified Rankin: Moderately severe disability     Balance Overall balance assessment: Needs assistance Sitting-balance support: Bilateral upper extremity supported;Feet supported (Posterior lean with dynamic tasks) Sitting balance-Leahy Scale: Fair Sitting balance - Comments: Posterior lean on bil UE for support when lifting feet to change clothing Postural control: Posterior lean Standing balance support: Bilateral upper extremity supported Standing balance-Leahy Scale: Poor Standing balance comment: reliant on BUE support of RW and external support especially with dynamic tasks               Cognition Arousal/Alertness: Awake/alert Behavior During Therapy: WFL for tasks assessed/performed Overall Cognitive Status: History of cognitive  impairments - at baseline Area of Impairment: Orientation;Attention;Memory;Following commands;Safety/judgement;Awareness;Problem solving  Orientation Level: Time;Disoriented to;Situation Current Attention Level: Selective Memory: Decreased short-term memory;Decreased recall of precautions Following Commands: Follows one step commands inconsistently;Follows one step commands with increased time Safety/Judgement: Decreased awareness of safety;Decreased awareness of deficits Awareness: Intellectual Problem Solving: Slow processing;Decreased initiation;Requires verbal cues;Difficulty sequencing;Requires tactile cues General Comments: Pt is alert and aware, she remains confused as to situation, required multimodal cues for task sequencing and safety with transfers and seated balance tasks.           General Comments General comments (skin integrity, edema, etc.): assisted pt to don clothing due to imminent DC prior to gait trial, pt confused and needing dense cues for balance/safety with seated/standing tasks      Pertinent Vitals/Pain Pain Assessment: No/denies pain Pain Intervention(s): Monitored during session;Repositioned     PT Goals (current goals can now be found in the care plan section) Acute Rehab PT Goals Patient Stated Goal: pt unable to state 2/2 dementia PT Goal Formulation: With family Time For Goal Achievement: 12/13/20 Progress towards PT goals: Progressing toward goals    Frequency    Min 3X/week      PT Plan Current plan remains appropriate       AM-PAC PT "6 Clicks" Mobility   Outcome Measure  Help needed turning from your back to your side while in a flat bed without using bedrails?: A Little Help needed moving from lying on your back to sitting on the side of a flat bed without using bedrails?: A Little Help needed moving to and from a bed to a chair (including a wheelchair)?: A Little Help needed standing up from a chair using your arms (e.g.,  wheelchair or bedside chair)?: A Little Help needed to walk in hospital room?: A Little Help needed climbing 3-5 steps with a railing? : Total 6 Click Score: 16    End of Session Equipment Utilized During Treatment: Gait belt Activity Tolerance: Patient tolerated treatment well Patient left: in chair;with call bell/phone within reach;with chair alarm set (pt lunch tray in front set up to eat) Nurse Communication: Mobility status;Other (comment) (pt likely will need increased help to eat due to cognition) PT Visit Diagnosis: Unsteadiness on feet (R26.81);Muscle weakness (generalized) (M62.81);Difficulty in walking, not elsewhere classified (R26.2);Other symptoms and signs involving the nervous system (R29.898);History of falling (Z91.81)     Time: 1250-1316 PT Time Calculation (min) (ACUTE ONLY): 26 min  Charges:  $Gait Training: 8-22 mins $Therapeutic Activity: 8-22 mins                     Tierria Watson P., PTA Acute Rehabilitation Services Pager: [phone] Office: [phone]    [place] 12/09/2020, 3:50 PM

## 2020-12-09 NOTE — Progress Notes (Signed)
Janith Lima to be D/C'd to [facility] per MD order. Report called to [place], Charity fundraiser at [facility]. Skin clean and dry with scattered bruising. IV catheter discontinued intact. Site without signs and symptoms of complications. Dressing and pressure applied.  An After Visit Summary was printed and given to the patient.  Patient escorted via WC, and D/C to [facility] via private auto.  Jon Gills  12/09/2020

## 2020-12-09 NOTE — Discharge Summary (Signed)
Physician Discharge Summary  [name] PYK:[id number] DOB: Jul 14, 1933 DOA: 11/28/2020  PCP: Lewis Moccasin, MD  Admit date: 11/28/2020 Discharge date: 12/09/2020  Admitted From: Home Disposition:  SNF  Recommendations for Outpatient Follow-up:  Please check CBC, CMP in 3 days Please keep encouraging oral intake, continue with Ensure. she is currently on dysphagia 3 with nectar thick, please have SLP to follow at facility   Discharge Condition:Stable CODE STATUS:FULL Diet recommendation: Dysphagia 3 with nectar thick  Brief/Interim Summary:  - Ann Johns  is a 87 y.o. female, with history of intracerebral hemorrhage in 2019, seizures, early dementia, essential hypertension who lives at an assisted living facility was brought in by her son to the hospital for evaluation.  According to the son she was in today morning around 9 AM when she had a fall at assisted living facility, she subsequently did well however she was feeling weak the whole day, this morning she had another 4 at assisted living facility after which she was brought to the [facility] ER where she was found to have an acute stroke, UTI and a COVID-19 infection.  Neurology was consulted .    Left PCA  - CVA in the Subcortical left parietooccipital lobes and callosal splenium - causing x 2 falls, starting over 36 hrs before admission - she thankfully has mild deficits, seen by neurology, recommendation for dual antiplatelet therapy aspirin and Plavix for 3 weeks then aspirin alone.  Continue with statin. - PT - OT - SLP consulted, recommendation for SNF placement. -She remains on honey thick, reassessed again by SLP on 10/22, with diet recommendation remain dysphagia 3 with honey thick liquid.  Need close follow-up by SLP at facility   UTI  - Rocephin x 3, negative cultures.   COVID infection.   -She has had 2 doses of vaccine so far, she did have a dry cough, moderate inflammatory markers finished short course of  Decadron IV and Remdesivir - resolved clinically.   Hypertension.  -  low dose Lopressor added on 12/03/20   Dementia. -    Continue Namenda at home dose   Discharge Diagnoses:  Active Problems:   CVA (cerebral vascular accident) [facility])    Discharge Instructions  Discharge Instructions     Ambulatory referral to Neurology   Complete by: As directed    Follow up with Christia Reading NP at [facility] in about 4 weeks. Thanks.   Discharge instructions   Complete by: As directed    Follow with Primary MD /SNF physician   Activity: As tolerated with Full fall precautions use walker/cane & assistance as needed   Disposition SNF   Diet: Dysphagia 3 with honey thick  On your next visit with your primary care physician please Get Medicines reviewed and adjusted.   Please request your Prim.MD to go over all Hospital Tests and Procedure/Radiological results at the follow up, please get all Hospital records sent to your Prim MD by signing hospital release before you go home.   If you experience worsening of your admission symptoms, develop shortness of breath, life threatening emergency, suicidal or homicidal thoughts you must seek medical attention immediately by calling 911 or calling your MD immediately  if symptoms less severe.  You Must read complete instructions/literature along with all the possible adverse reactions/side effects for all the Medicines you take and that have been prescribed to you. Take any new Medicines after you have completely understood and accpet all the possible adverse reactions/side effects.   Do not drive,  operating heavy machinery, perform activities at heights, swimming or participation in water activities or provide baby sitting services if your were admitted for syncope or siezures until you have seen by Primary MD or a Neurologist and advised to do so again.  Do not drive when taking Pain medications.    Do not take more than prescribed Pain, Sleep and Anxiety  Medications  Special Instructions: If you have smoked or chewed Tobacco  in the last 2 yrs please stop smoking, stop any regular Alcohol  and or any Recreational drug use.  Wear Seat belts while driving.   Please note  You were cared for by a hospitalist during your hospital stay. If you have any questions about your discharge medications or the care you received while you were in the hospital after you are discharged, you can call the unit and asked to speak with the hospitalist on call if the hospitalist that took care of you is not available. Once you are discharged, your primary care physician will handle any further medical issues. Please note that NO REFILLS for any discharge medications will be authorized once you are discharged, as it is imperative that you return to your primary care physician (or establish a relationship with a primary care physician if you do not have one) for your aftercare needs so that they can reassess your need for medications and monitor your lab values.   Increase activity slowly   Complete by: As directed       Allergies as of 12/09/2020       Reactions   Sulfa Antibiotics Rash        Medication List     STOP taking these medications    propranolol 20 MG tablet Commonly known as: INDERAL       TAKE these medications    acetaminophen 325 MG tablet Commonly known as: TYLENOL Take 2 tablets (650 mg total) by mouth every 6 (six) hours as needed for mild pain, fever or headache (or Fever >/= 101). What changed:  how much to take when to take this reasons to take this   aspirin 81 MG chewable tablet Chew 1 tablet (81 mg total) by mouth daily. Start taking on: December 10, 2020   atorvastatin 40 MG tablet Commonly known as: LIPITOR Take 1 tablet (40 mg total) by mouth daily. Start taking on: December 10, 2020   bisacodyl 5 MG EC tablet Commonly known as: DULCOLAX Take 1 tablet (5 mg total) by mouth daily as needed for moderate  constipation.   CENTRUM PO Take 1 tablet by mouth daily.   clopidogrel 75 MG tablet Commonly known as: PLAVIX Take 1 tablet (75 mg total) by mouth daily for 10 days. Stop on 12/20/2020 Start taking on: December 10, 2020   feeding supplement Liqd Take 237 mLs by mouth 3 (three) times daily between meals.   memantine 10 MG tablet Commonly known as: Namenda Take 1 tablet (10 mg total) by mouth 2 (two) times daily.   metoprolol tartrate 25 MG tablet Commonly known as: LOPRESSOR Take 1 tablet (25 mg total) by mouth 2 (two) times daily.   PROBIOTIC PO Take 1 capsule by mouth daily.   sertraline 25 MG tablet Commonly known as: ZOLOFT Take 25 mg by mouth daily.   VITAMIN D PO Take 75 mcg by mouth daily.        Allergies  Allergen Reactions   Sulfa Antibiotics Rash    Consultations: neurology   Procedures/Studies: DG Chest 1  View  Result Date: 11/28/2020 CLINICAL DATA:  Stroke.  Pre MRI metal screening EXAM: CHEST  1 VIEW; PELVIS - 1-2 VIEW; ABDOMEN - 1 VIEW COMPARISON:  None. FINDINGS: The heart and mediastinal contours are within normal limits. Aortic calcification. 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 No focal consolidation. No pulmonary edema. No pleural effusion. No pneumothorax. Nonobstructive bowel gas pattern. No acute osseous abnormality. Multilevel degenerative changes of the thoracolumbar spine. No retained radiopaque foreign body. IMPRESSION: 1. No retained radiopaque foreign body. 2. No acute cardiopulmonary disease. 3. Nonobstructive bowel gas pattern. 4.  Aortic Atherosclerosis (ICD10-I70.0). Electronically Signed   By: Tish Frederickson M.D.   On: 11/28/2020 15:19   DG Pelvis 1-2 Views  Result Date: 11/28/2020 CLINICAL DATA:  Stroke.  Pre MRI metal screening EXAM: CHEST  1 VIEW; PELVIS - 1-2 VIEW; ABDOMEN - 1 VIEW COMPARISON:  None. FINDINGS: The heart and mediastinal contours are within normal limits. Aortic calcification. Likely eventration of the  right hemidiaphragm. No focal consolidation. No pulmonary edema. No pleural effusion. No pneumothorax. Nonobstructive bowel gas pattern. No acute osseous abnormality. Multilevel degenerative changes of the thoracolumbar spine. No retained radiopaque foreign body. IMPRESSION: 1. No retained radiopaque foreign body. 2. No acute cardiopulmonary disease. 3. Nonobstructive bowel gas pattern. 4.  Aortic Atherosclerosis (ICD10-I70.0). Electronically Signed   By: Tish Frederickson M.D.   On: 11/28/2020 15:19   DG Abd 1 View  Result Date: 11/28/2020 CLINICAL DATA:  Stroke.  Pre MRI metal screening EXAM: CHEST  1 VIEW; PELVIS - 1-2 VIEW; ABDOMEN - 1 VIEW COMPARISON:  None. FINDINGS: The heart and mediastinal contours are within normal limits. Aortic calcification. 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 No focal consolidation. No pulmonary edema. No pleural effusion. No pneumothorax. Nonobstructive bowel gas pattern. No acute osseous abnormality. Multilevel degenerative changes of the thoracolumbar spine. No retained radiopaque foreign body. IMPRESSION: 1. No retained radiopaque foreign body. 2. No acute cardiopulmonary disease. 3. Nonobstructive bowel gas pattern. 4.  Aortic Atherosclerosis (ICD10-I70.0). Electronically Signed   By: Tish Frederickson M.D.   On: 11/28/2020 15:19   CT HEAD WO CONTRAST ( )  Result Date: 11/28/2020 CLINICAL DATA:  Head trauma, minor (Age >= 65y) hx of dementia with repeated falls recently, hematoma near right eye, evaluate for ICH EXAM: CT HEAD WITHOUT CONTRAST TECHNIQUE: Contiguous axial images were obtained from the base of the skull through the vertex without intravenous contrast. COMPARISON:  MRI 04/16/2020, CT 12/24/2018 FINDINGS: Brain: New area of low attenuation in the anteroinferior left frontal lobe with loss of gray-white differentiation and developing encephalomalacia is new from prior and likely represents a late subacute or chronic left ACA territory infarction.  Chronic right anterior frontal lobe infarct is unchanged. No intracranial hemorrhage. No extra-axial collection. Stable size and configuration of the ventricles. No mass lesion or mass effect. Extensive low-density changes within the periventricular and subcortical white matter compatible with chronic microvascular ischemic change. Mild diffuse cerebral volume loss. Vascular: Atherosclerotic calcifications involving the large vessels of the skull base. No unexpected hyperdense vessel. Skull: Normal. Negative for fracture or focal lesion. Sinuses/Orbits: Mucosal thickening and partial opacification of the right maxillary sinus. There is extensive mucosal thickening within the bilateral ethmoid air cells. Orbital structures within normal limits. Other: Right periorbital soft tissue swelling. IMPRESSION: 1. Low-attenuation changes in the left frontal lobe is new from prior and likely represents a late subacute to chronic left ACA territory infarction. Further evaluation with MRI of the brain is recommended. 2. No intracranial hemorrhage identified.  3. Chronic right anterior frontal lobe infarct is unchanged. 4. Right periorbital soft tissue swelling. 5. Paranasal sinus disease as described. Electronically Signed   By: Duanne Guess D.O.   On: 11/28/2020 12:58   MR ANGIO HEAD WO CONTRAST  Result Date: 11/28/2020 CLINICAL DATA:  Acute neurologic deficit EXAM: MRA HEAD WITHOUT CONTRAST TECHNIQUE: Angiographic images of the Circle of Willis were acquired using MRA technique without intravenous contrast. COMPARISON:  No pertinent prior exam. FINDINGS: POSTERIOR CIRCULATION: --Vertebral arteries: Normal --Inferior cerebellar arteries: Normal. --Basilar artery: Normal. --Superior cerebellar arteries: Normal. --Posterior cerebral arteries: Mild narrowing of the right PCA P2 segment. Normal left. ANTERIOR CIRCULATION: --Intracranial internal carotid arteries: Normal. --Anterior cerebral arteries (ACA): Normal. --Middle  cerebral arteries (MCA): Normal. ANATOMIC VARIANTS: None IMPRESSION: 1. No emergent large vessel occlusion or high-grade stenosis. 2. Mild narrowing of the right PCA P2 segment. Electronically Signed   By: Deatra Robinson M.D.   On: 11/28/2020 23:20   MR BRAIN WO CONTRAST  Result Date: 11/28/2020 CLINICAL DATA:  Stroke, follow-up; CT head with question of evolving or new subacute ACA infarct, patient with history of dementia and prior CVA, follow-up imaging. EXAM: MRI HEAD WITHOUT CONTRAST TECHNIQUE: Multiplanar, multiecho pulse sequences of the brain and surrounding structures were obtained without intravenous contrast. COMPARISON:  Prior head CT 11/28/2020.  Brain MRI 04/16/2020. FINDINGS: Brain: Mild to moderate generalized cerebral atrophy. Comparatively mild cerebellar atrophy. Moderate-sized acute/early subacute infarct within the cortical/subcortical left parietooccipital lobes and within the callosal splenium (PCA vascular territory). Redemonstrated chronic foci of encephalomalacia within the anterior frontal lobes and anterior right temporal lobe, with associated chronic blood products. Background advanced patchy and confluent T2 FLAIR hyperintense signal abnormality within the cerebral white matter, nonspecific but compatible with chronic small vessel ischemic disease. Mild chronic small vessel ischemic changes are also present within the pons. No evidence of an intracranial mass. No extra-axial fluid collection. No midline shift. Partially empty sella turcica. Vascular: No definite loss of expected proximal arterial flow voids. Skull and upper cervical spine: No focal suspicious marrow lesion. Incompletely assessed cervical spondylosis. Sinuses/Orbits: Visualized orbits show no acute finding. Mild-to-moderate mucosal thickening within the bilateral ethmoid and right maxillary sinuses. Superimposed small volume frothy secretions within the right maxillary sinus. Mild mucosal thickening also present  within the bilateral sphenoid and left maxillary sinuses. Other: 13 mm left parietal scalp lesion, nonspecific but possibly reflecting a sebaceous cyst. IMPRESSION: Moderate-sized acute/early subacute left PCA territory infarct affecting the cortical/subcortical left parietooccipital lobes and callosal splenium. Redemonstrated chronic encephalomalacia and associated chronic blood products in the anterior frontal lobes, and anterior right temporal lobe, presumably posttraumatic in etiology. Chronic small-vessel ischemic changes which are severe in the cerebral white matter, and mild in the pons. Mild-to-moderate generalized cerebral atrophy. Comparatively mild cerebellar atrophy. Paranasal sinus disease, as described. Electronically Signed   By: Jackey Loge D.O.   On: 11/28/2020 16:02   DG Chest Port 1 View  Result Date: 11/29/2020 CLINICAL DATA:  Shortness of breath.  Altered mental status. EXAM: PORTABLE CHEST 1 VIEW COMPARISON:  11/28/2020 FINDINGS: The patient is rotated to the right with grossly unchanged cardiomediastinal silhouette. Eventration of the right hemidiaphragm is again noted. No confluent airspace opacity, edema, sizable pleural effusion, or pneumothorax is identified. Thoracic levoscoliosis is noted. IMPRESSION: No active disease. Electronically Signed   By: Sebastian Ache M.D.   On: 11/29/2020 05:31   DG Swallowing Func-Speech Pathology  Result Date: 12/03/2020 Table formatting from the original result was not included. Objective  Swallowing Evaluation: Type of Study: MBS-Modified Barium Swallow Study  Patient Details Name: Treazure Schone MRN: [id number] Date of Birth: 22-Sep-1933 Today's Date: 12/03/2020 Time: SLP Start Time (ACUTE ONLY): 1516 -SLP Stop Time (ACUTE ONLY): 1540 SLP Time Calculation (min) (ACUTE ONLY): 24 min Past Medical History: Past Medical History: Diagnosis Date  Cerebrovascular disease 10/03/2020  ICH (intracerebral hemorrhage) (HCC) 11/01/2017  Right frontal  Memory change  10/03/2020  Seizures (HCC) 11/01/2017 Past Surgical History: Past Surgical History: Procedure Laterality Date  VAGINAL HYSTERECTOMY   HPI: 87 yo female who presented on 10/13, brought to hospital by her son from her ALF after weakness and multiple falls. 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 PMH ICH right frontal lobe 2019, seizures, dementia, HTN.  Subjective: Pt was pleasant and cooperative with son at bedside Assessment / Plan / Recommendation CHL IP CLINICAL IMPRESSIONS 12/03/2020 Clinical Impression Pt demonstrated moderate oropharyngeal dysphagia with aspiration present. Oral delays, decreased cohesion and reduced manipulation describe her oral phase. Pt's epiglottis did not fully invert during all swallows and timing of laryngeal closure was late intermittently leading to penetration to vocal cords (PAS 5) and aspiration with straw (PAS 8). Material also penetrated and aspirated during multiple swallows. Reduced lingual retraction led to vallecular residue and pyriform residue present with mild increase in volume after thicker consistencies. Given pt's cognitive state, compensatory techniques were not attempted. Recommend continue soft texture and upgrade to honey thick liquids, multiple swallows, throat clear intermittently, crush pills and full supervision. SLP Visit Diagnosis Dysphagia, oropharyngeal phase (R13.12) Attention and concentration deficit following -- Frontal lobe and executive function deficit following -- Impact on safety and function Moderate aspiration risk;Severe aspiration risk   CHL IP TREATMENT RECOMMENDATION 12/03/2020 Treatment Recommendations Therapy as outlined in treatment plan below   Prognosis 12/03/2020 Prognosis for Safe Diet Advancement Fair Barriers to Reach Goals Cognitive deficits Barriers/Prognosis Comment -- CHL IP DIET RECOMMENDATION 12/03/2020 SLP Diet Recommendations Honey thick liquids;Other (Comment) Liquid Administration via Cup Medication  Administration Crushed with puree Compensations Minimize environmental distractions;Slow rate;Small sips/bites;Multiple dry swallows after each bite/sip;Clear throat intermittently Postural Changes Seated upright at 90 degrees   CHL IP OTHER RECOMMENDATIONS 12/03/2020 Recommended Consults -- Oral Care Recommendations Oral care BID Other Recommendations --   CHL IP FOLLOW UP RECOMMENDATIONS 12/03/2020 Follow up Recommendations Skilled Nursing facility   [facility] IP FREQUENCY AND DURATION 12/03/2020 Speech Therapy Frequency (ACUTE ONLY) min 2x/week Treatment Duration 2 weeks      CHL IP ORAL PHASE 12/03/2020 Oral Phase Impaired Oral - Pudding Teaspoon -- Oral - Pudding Cup -- Oral - Honey Teaspoon -- Oral - Honey Cup Delayed oral transit Oral - Nectar Teaspoon -- Oral - Nectar Cup Delayed oral transit Oral - Nectar Straw -- Oral - Thin Teaspoon -- Oral - Thin Cup Decreased bolus cohesion;Lingual/palatal residue Oral - Thin Straw Decreased bolus cohesion Oral - Puree Delayed oral transit Oral - Mech Soft Weak lingual manipulation Oral - Regular Impaired mastication Oral - Multi-Consistency -- Oral - Pill -- Oral Phase - Comment --  CHL IP PHARYNGEAL PHASE 12/03/2020 Pharyngeal Phase Impaired Pharyngeal- Pudding Teaspoon -- Pharyngeal -- Pharyngeal- Pudding Cup -- Pharyngeal -- Pharyngeal- Honey Teaspoon -- Pharyngeal -- Pharyngeal- Honey Cup -- Pharyngeal -- Pharyngeal- Nectar Teaspoon -- Pharyngeal -- Pharyngeal- Nectar Cup -- Pharyngeal -- Pharyngeal- Nectar Straw -- Pharyngeal -- Pharyngeal- Thin Teaspoon -- Pharyngeal -- Pharyngeal- Thin Cup Reduced epiglottic inversion;Pharyngeal residue - valleculae;Pharyngeal residue - pyriform;Penetration/Aspiration during swallow;Reduced airway/laryngeal closure Pharyngeal Material  enters airway, remains ABOVE vocal cords then ejected out;Material enters airway, remains ABOVE vocal cords and not ejected out Pharyngeal- Thin Straw Penetration/Aspiration during swallow;Pharyngeal  residue - valleculae;Pharyngeal residue - pyriform Pharyngeal Material enters airway, CONTACTS cords and not ejected out;Material enters airway, passes BELOW cords without attempt by patient to eject out (silent aspiration) Pharyngeal- Puree Pharyngeal residue - valleculae Pharyngeal -- Pharyngeal- Mechanical Soft Pharyngeal residue - valleculae Pharyngeal -- Pharyngeal- Regular Pharyngeal residue - valleculae Pharyngeal -- Pharyngeal- Multi-consistency -- Pharyngeal -- Pharyngeal- Pill -- Pharyngeal -- Pharyngeal Comment --  CHL IP CERVICAL ESOPHAGEAL PHASE 12/03/2020 Cervical Esophageal Phase WFL Pudding Teaspoon -- Pudding Cup -- Honey Teaspoon -- Honey Cup -- Nectar Teaspoon -- Nectar Cup -- Nectar Straw -- Thin Teaspoon -- Thin Cup -- Thin Straw -- Puree -- Mechanical Soft -- Regular -- Multi-consistency -- Pill -- Cervical Esophageal Comment -- Royce Macadamia 12/03/2020, 4:57 PM              EEG adult  Result Date: 11/29/2020 Charlsie Quest, MD     11/29/2020 12:33 PM Patient Name: Khori Underberg MRN: [id number] Epilepsy Attending: Charlsie Quest Referring Physician/Provider: Janey Genta, NP Date: 11/29/2020 Duration: 25.17 mins Patient history: 87yo F with ams. EEG to evaluate for seizure Level of alertness: Awake, asleep AEDs during EEG study: None Technical aspects: This EEG study was done with scalp electrodes positioned according to the 10-20 International system of electrode placement. Electrical activity was acquired at a sampling rate of  and reviewed with a high frequency filter of  and a low frequency filter of . EEG data were recorded continuously and digitally stored. Description: The posterior dominant rhythm consists of 7.5 Hz activity of moderate voltage (25-35 uV) seen predominantly in posterior head regions, symmetric and reactive to eye opening and eye closing. Sleep was characterized by vertex waves, sleep spindles (12 to 14 Hz), maximal frontocentral  region. EEG showed intermittent generalized 3 to 6 Hz theta-delta slowing. Hyperventilation and photic stimulation were not performed.   ABNORMALITY - Intermittent slow, generalized IMPRESSION: This study is suggestive of mild diffuse encephalopathy, nonspecific etiology. No seizures or epileptiform discharges were seen throughout the recording. Charlsie Quest   ECHOCARDIOGRAM COMPLETE  Result Date: 11/29/2020    ECHOCARDIOGRAM REPORT   Patient Name:   AVAROSE MERVINE Date of Exam: 11/29/2020 Medical Rec #:  [id number]     Height:       62.0 in Accession #:    [id number]    Weight:       114.0 lb Date of Birth:  29-Apr-1933     BSA:          1.505 m Patient Age:    87 years      BP:           157/82 mmHg Patient Gender: F             HR:           98 bpm. Exam Location:  Inpatient Procedure: 2D Echo, Cardiac Doppler, Color Doppler and Intracardiac            Opacification Agent Indications:    Stroke I63.9  History:        Patient has prior history of Echocardiogram examinations, most                 recent 11/12/2017.  Sonographer:    Leta Jungling RDCS Referring Phys: [id number] [facility]  Sonographer Comments: Technically difficult study due  to poor echo windows. IMPRESSIONS  1. Left ventricular ejection fraction, by estimation, is 70 to 75%. The left ventricle has hyperdynamic function. The left ventricle has no regional wall motion abnormalities. There is moderate asymmetric left ventricular hypertrophy of the basal-septal  segment. Indeterminate diastolic filling due to E-A fusion.  2. Right ventricular systolic function is normal. The right ventricular size is normal.  3. The mitral valve is grossly normal. Mild mitral valve regurgitation. No evidence of mitral stenosis.  4. The aortic valve was not well visualized. There is mild calcification of the aortic valve. Aortic valve regurgitation is mild to moderate. No aortic stenosis is present. Conclusion(s)/Recommendation(s): No intracardiac source of  embolism detected on this transthoracic study. A transesophageal echocardiogram is recommended to exclude cardiac source of embolism if clinically indicated. FINDINGS  Left Ventricle: Left ventricular ejection fraction, by estimation, is 70 to 75%. The left ventricle has hyperdynamic function. The left ventricle has no regional wall motion abnormalities. Definity contrast agent was given IV to delineate the left ventricular endocardial borders. The left ventricular internal cavity size was normal in size. There is moderate asymmetric left ventricular hypertrophy of the basal-septal segment. Indeterminate diastolic filling due to E-A fusion. Right Ventricle: The right ventricular size is normal. No increase in right ventricular wall thickness. Right ventricular systolic function is normal. Left Atrium: Left atrial size was normal in size. Right Atrium: Right atrial size was normal in size. Pericardium: There is no evidence of pericardial effusion. Presence of pericardial fat pad. Mitral Valve: The mitral valve is grossly normal. Mild mitral valve regurgitation. No evidence of mitral valve stenosis. Tricuspid Valve: The tricuspid valve is normal in structure. Tricuspid valve regurgitation is mild . No evidence of tricuspid stenosis. Aortic Valve: The aortic valve was not well visualized. There is mild calcification of the aortic valve. Aortic valve regurgitation is mild to moderate. No aortic stenosis is present. Pulmonic Valve: The pulmonic valve was not well visualized. Pulmonic valve regurgitation is mild to moderate. No evidence of pulmonic stenosis. Aorta: The aortic root is normal in size and structure. Venous: The inferior vena cava was not well visualized. IAS/Shunts: The interatrial septum was not well visualized.  LEFT VENTRICLE PLAX 2D LVIDd:         3.40 cm   Diastology LVIDs:         2.90 cm   LV e' medial:    5.00 cm/s LV PW:         0.90 cm   LV E/e' medial:  12.0 LV IVS:        1.30 cm   LV e' lateral:    5.66 cm/s LVOT diam:     2.10 cm   LV E/e' lateral: 10.6 LV SV:         38 LV SV Index:   25 LVOT Area:     3.46 cm  RIGHT VENTRICLE RV S prime:     16.80 cm/s LEFT ATRIUM           Index LA diam:      2.70 cm 1.79 cm/m LA Vol (A4C): 14.5 ml 9.63 ml/m  AORTIC VALVE LVOT Vmax:   72.70 cm/s LVOT Vmean:  49.000 cm/s LVOT VTI:    0.109 m  AORTA Ao Root diam: 3.30 cm Ao Asc diam:  3.20 cm MITRAL VALVE                TRICUSPID VALVE MV Area (PHT): 5.46 cm     TR Peak  grad:   23.2 mmHg MV Decel Time: 139 msec     TR Vmax:        241.00 cm/s MV E velocity: 59.75 cm/s MV A velocity: 104.00 cm/s  SHUNTS MV E/A ratio:  0.57         Systemic VTI:  0.11 m                             Systemic Diam: 2.10 cm Weston Brass MD Electronically signed by Weston Brass MD Signature Date/Time: 11/29/2020/6:57:24 PM    Final    VAS US CAROTID (at [facility] and [facility] only)  Result Date: 11/29/2020 Carotid Arterial Duplex Study Patient Name:  ANTONIETTE PEAKE  Date of Exam:   11/29/2020 Medical Rec #: [id number]      Accession #:    [id number] Date of Birth: 02/12/1934      Patient Gender: F Patient Age:   18 years Exam Location:  [facility] Procedure:      VAS US CAROTID Referring Phys: Terrilee Files [facility] --------------------------------------------------------------------------------  Indications:       CVA and recent falls, weakness, COVID+. Risk Factors:      Hypertension. Other Factors:     HX of ICH (2019). Comparison Study:  Previous exam 12/07/2017 1-39% BIL Performing Technologist: Ernestene Mention RVT, RDMS  Examination Guidelines: A complete evaluation includes B-mode imaging, spectral Doppler, color Doppler, and power Doppler as needed of all accessible portions of each vessel. Bilateral testing is considered an integral part of a complete examination. Limited examinations for reoccurring indications may be performed as noted.  Right Carotid Findings: +----------+--------+--------+--------+------------------+------------------+            PSV cm/sEDV cm/sStenosisPlaque DescriptionComments           +----------+--------+--------+--------+------------------+------------------+ CCA Prox  61      9                                 intimal thickening +----------+--------+--------+--------+------------------+------------------+ CCA Distal62      14                                intimal thickening +----------+--------+--------+--------+------------------+------------------+ ICA Prox  35      10              calcific and focaltortuous           +----------+--------+--------+--------+------------------+------------------+ ICA Distal74      17                                tortuous           +----------+--------+--------+--------+------------------+------------------+ ECA       65      0               calcific                             +----------+--------+--------+--------+------------------+------------------+ +----------+--------+-------+----------------+-------------------+           PSV cm/sEDV cmsDescribe        Arm Pressure (mmHG) +----------+--------+-------+----------------+-------------------+ [id number]             Multiphasic, WNL                    +----------+--------+-------+----------------+-------------------+ +---------+--------+--+--------+--+---------+  VertebralPSV cm/s58EDV cm/s11Antegrade +---------+--------+--+--------+--+---------+  Left Carotid Findings: +----------+--------+--------+--------+------------------+---------------------+           PSV cm/sEDV cm/sStenosisPlaque DescriptionComments              +----------+--------+--------+--------+------------------+---------------------+ CCA Prox  53      11                                                      +----------+--------+--------+--------+------------------+---------------------+ CCA Distal49      14                                intimal thickening     +----------+--------+--------+--------+------------------+---------------------+ ICA Prox  41      14              calcific          intimal thickening &                                                      tortuous              +----------+--------+--------+--------+------------------+---------------------+ ICA Distal101     27                                                      +----------+--------+--------+--------+------------------+---------------------+ ECA       40      0               calcific                                +----------+--------+--------+--------+------------------+---------------------+ +----------+--------+--------+----------------+-------------------+           PSV cm/sEDV cm/sDescribe        Arm Pressure (mmHG) +----------+--------+--------+----------------+-------------------+ Subclavian102             Multiphasic, WNL                    +----------+--------+--------+----------------+-------------------+ +---------+--------+--+--------+--+---------+ VertebralPSV cm/s52EDV cm/s11Antegrade +---------+--------+--+--------+--+---------+   Summary: Right Carotid: Velocities in the right ICA are consistent with a 1-39% stenosis.                The extracranial vessels were near-normal with only minimal wall                thickening or plaque. Left Carotid: Velocities in the left ICA are consistent with a 1-39% stenosis.               The extracranial vessels were near-normal with only minimal wall               thickening or plaque. Vertebrals:  Bilateral vertebral arteries demonstrate antegrade flow. Subclavians: Normal flow hemodynamics were seen in bilateral subclavian              arteries. *See table(s) above for measurements and observations.  Electronically signed by Sherald Hess MD on 11/29/2020 at 4:10:26 PM.  Final    (Echo, Carotid, EGD, Colonoscopy, ERCP)    Subjective:   Discharge Exam: Vitals:   12/09/20 0425  12/09/20 0743  BP: 114/62 120/64  Pulse: 81 84  Resp: 18 18  Temp: 98.5 F (36.9 C) (!) 97.5 F (36.4 C)  SpO2: 96% 97%   Vitals:   12/08/20 1957 12/09/20 0051 12/09/20 0425 12/09/20 0743  BP: 113/73 118/71 114/62 120/64  Pulse: (!) 103 98 81 84  Resp: 16 16 18 18   Temp: 98.4 F (36.9 C) 99 F (37.2 C) 98.5 F (36.9 C) (!) 97.5 F (36.4 C)  TempSrc: Oral Oral Oral Oral  SpO2: 96% 95% 96% 97%  Weight:      Height:        General: Pt is alert, awake, she is pleasantly demented,frail, not in acute distress Cardiovascular: RRR, S1/S2 +, no rubs, no gallops Respiratory: CTA bilaterally, no wheezing, no rhonchi Abdominal: Soft, NT, ND, bowel sounds + Extremities: no edema, no cyanosis    The results of significant diagnostics from this hospitalization (including imaging, microbiology, ancillary and laboratory) are listed below for reference.     Microbiology: No results found for this or any previous visit (from the past 240 hour(s)).   Labs: BNP (last 3 results) Recent Labs    11/29/20 0219 11/30/20 0453 12/01/20 0359  BNP 60.9 268.0* 88.1   Basic Metabolic Panel: Recent Labs  Lab 12/06/20 0832  NA 138  K 4.1  CL 103  CO2 26  GLUCOSE 128*  BUN 16  CREATININE 0.58  CALCIUM 9.0   Liver Function Tests: No results for input(s): AST, ALT, ALKPHOS, BILITOT, PROT, ALBUMIN in the last 168 hours. No results for input(s): LIPASE, AMYLASE in the last 168 hours. No results for input(s): AMMONIA in the last 168 hours. CBC: Recent Labs  Lab 12/06/20 0832  WBC 15.5*  HGB 12.2  HCT 36.9  MCV 92.3  PLT 370   Cardiac Enzymes: No results for input(s): CKTOTAL, CKMB, CKMBINDEX, TROPONINI in the last 168 hours. BNP: Invalid input(s): POCBNP CBG: Recent Labs  Lab 12/02/20 1619  GLUCAP 135*   D-Dimer No results for input(s): DDIMER in the last 72 hours. Hgb A1c No results for input(s): HGBA1C in the last 72 hours. Lipid Profile No results for input(s):  CHOL, HDL, LDLCALC, TRIG, CHOLHDL, LDLDIRECT in the last 72 hours. Thyroid function studies No results for input(s): TSH, T4TOTAL, T3FREE, THYROIDAB in the last 72 hours.  Invalid input(s): FREET3 Anemia work up No results for input(s): VITAMINB12, FOLATE, FERRITIN, TIBC, IRON, RETICCTPCT in the last 72 hours. Urinalysis    Component Value Date/Time   COLORURINE YELLOW 11/28/2020 1014   APPEARANCEUR HAZY (A) 11/28/2020 1014   LABSPEC 1.011 11/28/2020 1014   PHURINE 6.0 11/28/2020 1014   GLUCOSEU NEGATIVE 11/28/2020 1014   HGBUR NEGATIVE 11/28/2020 1014   BILIRUBINUR NEGATIVE 11/28/2020 1014   KETONESUR 20 (A) 11/28/2020 1014   PROTEINUR NEGATIVE 11/28/2020 1014   NITRITE NEGATIVE 11/28/2020 1014   LEUKOCYTESUR LARGE (A) 11/28/2020 1014   Sepsis Labs Invalid input(s): PROCALCITONIN,  WBC,  LACTICIDVEN Microbiology No results found for this or any previous visit (from the past 240 hour(s)).   Time coordinating discharge: Over 30 minutes  SIGNED:   Huey Bienenstock, MD  [facility] 12/09/2020, 10:17 AM Pager   If 7PM-7AM, please contact night-coverage [email or web address] Password [email or web address]

## 2020-12-10 ENCOUNTER — Encounter: Payer: Self-pay | Admitting: Adult Health

## 2020-12-10 ENCOUNTER — Non-Acute Institutional Stay (SKILLED_NURSING_FACILITY): Payer: Medicare Other | Admitting: Adult Health

## 2020-12-10 DIAGNOSIS — N390 Urinary tract infection, site not specified: Secondary | ICD-10-CM | POA: Diagnosis not present

## 2020-12-10 DIAGNOSIS — I10 Essential (primary) hypertension: Secondary | ICD-10-CM | POA: Diagnosis not present

## 2020-12-10 DIAGNOSIS — U071 COVID-19: Secondary | ICD-10-CM

## 2020-12-10 DIAGNOSIS — I63432 Cerebral infarction due to embolism of left posterior cerebral artery: Secondary | ICD-10-CM

## 2020-12-10 DIAGNOSIS — R1312 Dysphagia, oropharyngeal phase: Secondary | ICD-10-CM

## 2020-12-10 DIAGNOSIS — F0390 Unspecified dementia without behavioral disturbance: Secondary | ICD-10-CM

## 2020-12-10 NOTE — Progress Notes (Signed)
Location:  [facility] Room Number: 302-A Place of Service:  SNF (31) Provider:  Kenard Gower, DNP, FNP-BC  Patient Care Team: Lewis Moccasin, MD as PCP - General (Family Medicine) Florentina Jenny, MD as Referring Physician [facility])  Extended Emergency Contact Information Primary Emergency Contact: Ann Johns Mobile Phone: [phone] Relation: Son Secondary Emergency Contact: Ann Johns Mobile Phone: [phone] Relation: Daughter  Code Status:  Full Code   Goals of care: Advanced Directive information Advanced Directives 12/10/2020  Does Patient Have a Medical Advance Directive? No  Would patient like information on creating a medical advance directive? No - Patient declined     Chief Complaint  Patient presents with   Hospitalization Follow-up    Follow-up from recent hospital stay     HPI:  Pt is a 87 y.o. female who was admitted to [facility] on 12/09/20 post hospital admission 11/28/2020 to 12/09/2020.  She has a PMH of history of intracerebral hemorrhage in 2019, seizures, early dementia and essential hypertension.  She lives in an assisted living facility.  She fell twice during the day and was brought to the ED by her son.  MRI without contrast on 11/28/2020 showed moderate sized acute/early subacute left PCA territory infarct affecting the cortical/subcortical left parietal occipital lobes and callosal splenium.  She was seen by neurology and recommended dual antiplatelet therapy with aspirin and Plavix for 3 weeks then aspirin alone.  Statin was continued. She was evaluated by SLP on 12/07/2020 and was recommended to remain on on dysphagia 3 with honey thick liquid.  She was also treated for UTI with Rocephin, cultures were negative.  She tested positive for COVID-19 infection.  She had 2 doses COVID-19 vaccination and was treated with short course of Decadron IV and remdesivir.  She completed  her isolation protocol in the hospital.  She was seen in the hallway. She is able to move X 4 extremities. When asked what is the current month (October), she answered,"December." No noted coughing.   Past Medical History:  Diagnosis Date   Cerebrovascular disease 10/03/2020   ICH (intracerebral hemorrhage) (HCC) 11/01/2017   Right frontal   Memory change 10/03/2020   Seizures (HCC) 11/01/2017   Past Surgical History:  Procedure Laterality Date   VAGINAL HYSTERECTOMY      Allergies  Allergen Reactions   Sulfa Antibiotics Rash    Outpatient Encounter Medications as of 12/10/2020  Medication Sig   acetaminophen (TYLENOL) 325 MG tablet Take 2 tablets (650 mg total) by mouth every 6 (six) hours as needed for mild pain, fever or headache (or Fever >/= 101).   aspirin 81 MG chewable tablet Chew 1 tablet (81 mg total) by mouth daily.   atorvastatin (LIPITOR) 40 MG tablet Take 1 tablet (40 mg total) by mouth daily.   bisacodyl (DULCOLAX) 5 MG EC tablet Take 1 tablet (5 mg total) by mouth daily as needed for moderate constipation.   clopidogrel (PLAVIX) 75 MG tablet Take 1 tablet (75 mg total) by mouth daily for 10 days. Stop on 12/20/2020   feeding supplement (ENSURE ENLIVE / ENSURE PLUS) LIQD Take 237 mLs by mouth 3 (three) times daily between meals.   memantine (NAMENDA) 10 MG tablet Take 1 tablet (10 mg total) by mouth 2 (two) times daily.   metoprolol tartrate (LOPRESSOR) 25 MG tablet Take 1 tablet (25 mg total) by mouth 2 (two) times daily.   Multiple Vitamins-Minerals (CENTRUM PO) Take 1 tablet by mouth daily.  Probiotic Product (PROBIOTIC PO) Take 1 capsule by mouth daily.   sertraline (ZOLOFT) 25 MG tablet Take 25 mg by mouth daily.   VITAMIN D PO Take 75 mcg by mouth daily.   No facility-administered encounter medications on file as of 12/10/2020.    Review of Systems  GENERAL: No change in appetite, no fatigue, no weight changes, no fever or chills MOUTH and THROAT: Denies  oral discomfort, gingival pain or bleeding RESPIRATORY: no cough, SOB, DOE, wheezing, hemoptysis CARDIAC: No chest pain, edema or palpitations GI: No abdominal pain, diarrhea, constipation, heart burn, nausea or vomiting GU: Denies dysuria, frequency, hematuria or discharge NEUROLOGICAL: Denies dizziness, syncope, numbness, or headache PSYCHIATRIC: Denies feelings of depression or anxiety. No report of hallucinations, insomnia, paranoia, or agitation   Immunization History  Administered Date(s) Administered   Fluad Quad(high Dose 65+) 11/16/2020   Tdap 12/24/2018   Pertinent  Health Maintenance Due  Topic Date Due   DEXA SCAN  Never done   INFLUENZA VACCINE  Completed   Fall Risk 12/07/2020 12/07/2020 12/08/2020 12/08/2020 12/09/2020  Patient Fall Risk Level High fall risk High fall risk High fall risk High fall risk High fall risk     Vitals:   12/10/20 1027  BP: 111/68  Pulse: 78  Resp: 18  Temp: (!) 97.2 F (36.2 C)  Weight: 112 lb (50.8 kg)  Height: 5\' 2"  (1.575 m)   Body mass index is 20.49 kg/m.  Physical Exam  GENERAL APPEARANCE: Well nourished. In no acute distress. Normal body habitus SKIN:  Skin is warm and dry.  MOUTH and THROAT: Lips are without lesions. Oral mucosa is moist and without lesions.  RESPIRATORY: Breathing is even & unlabored, BS CTAB CARDIAC: RRR, no murmur,no extra heart sounds, no edema GI: Abdomen soft, normal BS, no masses, no tenderness NEUROLOGICAL: There is no tremor. Speech is clear. Alert to self, disoriented to time and place. PSYCHIATRIC:  Affect and behavior are appropriate  Labs reviewed: Recent Labs    11/29/20 0219 12/01/20 0359 12/06/20 0832  NA 135 135 138  K 3.9 3.7 4.1  CL 103 103 103  CO2 17* 22 26  GLUCOSE 184* 166* 128*  BUN 7* 13 16  CREATININE 0.62 0.50 0.58  CALCIUM 9.0 8.8* 9.0  MG 1.8 1.9  --    Recent Labs    11/29/20 0219 12/01/20 0359  AST 25 22  ALT 15 16  ALKPHOS 75 62  BILITOT 1.3* 0.7   PROT 6.7 6.0*  ALBUMIN 3.6 3.2*   Recent Labs    11/28/20 1051 11/28/20 1701 11/29/20 0219 12/01/20 0359 12/06/20 0832  WBC 10.0   < > 7.4 10.4 15.5*  NEUTROABS 7.5  --  6.2 8.2*  --   HGB 11.9*   < > 12.7 12.3 12.2  HCT 35.4*   < > 39.1 35.6* 36.9  MCV 91.5   < > 93.3 88.1 92.3  PLT 250   < > 269 320 370   < > = values in this interval not displayed.   No results found for: TSH Lab Results  Component Value Date   HGBA1C 5.7 (H) 11/29/2020   Lab Results  Component Value Date   CHOL 196 11/29/2020   HDL 48 11/29/2020   LDLCALC 133 (H) 11/29/2020   TRIG 75 11/29/2020   CHOLHDL 4.1 11/29/2020    Significant Diagnostic Results in last 30 days:  DG Chest 1 View  Result Date: 11/28/2020 CLINICAL DATA:  Stroke.  Pre MRI  metal screening EXAM: CHEST  1 VIEW; PELVIS - 1-2 VIEW; ABDOMEN - 1 VIEW COMPARISON:  None. FINDINGS: The heart and mediastinal contours are within normal limits. Aortic calcification. 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 No focal consolidation. No pulmonary edema. No pleural effusion. No pneumothorax. Nonobstructive bowel gas pattern. No acute osseous abnormality. Multilevel degenerative changes of the thoracolumbar spine. No retained radiopaque foreign body. IMPRESSION: 1. No retained radiopaque foreign body. 2. No acute cardiopulmonary disease. 3. Nonobstructive bowel gas pattern. 4.  Aortic Atherosclerosis (ICD10-I70.0). Electronically Signed   By: Tish Frederickson M.D.   On: 11/28/2020 15:19   DG Pelvis 1-2 Views  Result Date: 11/28/2020 CLINICAL DATA:  Stroke.  Pre MRI metal screening EXAM: CHEST  1 VIEW; PELVIS - 1-2 VIEW; ABDOMEN - 1 VIEW COMPARISON:  None. FINDINGS: The heart and mediastinal contours are within normal limits. Aortic calcification. 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 No focal consolidation. No pulmonary edema. No pleural effusion. No pneumothorax. Nonobstructive bowel gas pattern. No acute osseous abnormality. Multilevel  degenerative changes of the thoracolumbar spine. No retained radiopaque foreign body. IMPRESSION: 1. No retained radiopaque foreign body. 2. No acute cardiopulmonary disease. 3. Nonobstructive bowel gas pattern. 4.  Aortic Atherosclerosis (ICD10-I70.0). Electronically Signed   By: Tish Frederickson M.D.   On: 11/28/2020 15:19   DG Abd 1 View  Result Date: 11/28/2020 CLINICAL DATA:  Stroke.  Pre MRI metal screening EXAM: CHEST  1 VIEW; PELVIS - 1-2 VIEW; ABDOMEN - 1 VIEW COMPARISON:  None. FINDINGS: The heart and mediastinal contours are within normal limits. Aortic calcification. 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 No focal consolidation. No pulmonary edema. No pleural effusion. No pneumothorax. Nonobstructive bowel gas pattern. No acute osseous abnormality. Multilevel degenerative changes of the thoracolumbar spine. No retained radiopaque foreign body. IMPRESSION: 1. No retained radiopaque foreign body. 2. No acute cardiopulmonary disease. 3. Nonobstructive bowel gas pattern. 4.  Aortic Atherosclerosis (ICD10-I70.0). Electronically Signed   By: Tish Frederickson M.D.   On: 11/28/2020 15:19   CT HEAD WO CONTRAST ( )  Result Date: 11/28/2020 CLINICAL DATA:  Head trauma, minor (Age >= 65y) hx of dementia with repeated falls recently, hematoma near right eye, evaluate for ICH EXAM: CT HEAD WITHOUT CONTRAST TECHNIQUE: Contiguous axial images were obtained from the base of the skull through the vertex without intravenous contrast. COMPARISON:  MRI 04/16/2020, CT 12/24/2018 FINDINGS: Brain: New area of low attenuation in the anteroinferior left frontal lobe with loss of gray-white differentiation and developing encephalomalacia is new from prior and likely represents a late subacute or chronic left ACA territory infarction. Chronic right anterior frontal lobe infarct is unchanged. No intracranial hemorrhage. No extra-axial collection. Stable size and configuration of the ventricles. No mass lesion or  mass effect. Extensive low-density changes within the periventricular and subcortical white matter compatible with chronic microvascular ischemic change. Mild diffuse cerebral volume loss. Vascular: Atherosclerotic calcifications involving the large vessels of the skull base. No unexpected hyperdense vessel. Skull: Normal. Negative for fracture or focal lesion. Sinuses/Orbits: Mucosal thickening and partial opacification of the right maxillary sinus. There is extensive mucosal thickening within the bilateral ethmoid air cells. Orbital structures within normal limits. Other: Right periorbital soft tissue swelling. IMPRESSION: 1. Low-attenuation changes in the left frontal lobe is new from prior and likely represents a late subacute to chronic left ACA territory infarction. Further evaluation with MRI of the brain is recommended. 2. No intracranial hemorrhage identified. 3. Chronic right anterior frontal lobe infarct is unchanged. 4. Right periorbital  soft tissue swelling. 5. Paranasal sinus disease as described. Electronically Signed   By: Duanne Guess D.O.   On: 11/28/2020 12:58   MR ANGIO HEAD WO CONTRAST  Result Date: 11/28/2020 CLINICAL DATA:  Acute neurologic deficit EXAM: MRA HEAD WITHOUT CONTRAST TECHNIQUE: Angiographic images of the Circle of Willis were acquired using MRA technique without intravenous contrast. COMPARISON:  No pertinent prior exam. FINDINGS: POSTERIOR CIRCULATION: --Vertebral arteries: Normal --Inferior cerebellar arteries: Normal. --Basilar artery: Normal. --Superior cerebellar arteries: Normal. --Posterior cerebral arteries: Mild narrowing of the right PCA P2 segment. Normal left. ANTERIOR CIRCULATION: --Intracranial internal carotid arteries: Normal. --Anterior cerebral arteries (ACA): Normal. --Middle cerebral arteries (MCA): Normal. ANATOMIC VARIANTS: None IMPRESSION: 1. No emergent large vessel occlusion or high-grade stenosis. 2. Mild narrowing of the right PCA P2 segment.  Electronically Signed   By: Deatra Robinson M.D.   On: 11/28/2020 23:20   MR BRAIN WO CONTRAST  Result Date: 11/28/2020 CLINICAL DATA:  Stroke, follow-up; CT head with question of evolving or new subacute ACA infarct, patient with history of dementia and prior CVA, follow-up imaging. EXAM: MRI HEAD WITHOUT CONTRAST TECHNIQUE: Multiplanar, multiecho pulse sequences of the brain and surrounding structures were obtained without intravenous contrast. COMPARISON:  Prior head CT 11/28/2020.  Brain MRI 04/16/2020. FINDINGS: Brain: Mild to moderate generalized cerebral atrophy. Comparatively mild cerebellar atrophy. Moderate-sized acute/early subacute infarct within the cortical/subcortical left parietooccipital lobes and within the callosal splenium (PCA vascular territory). Redemonstrated chronic foci of encephalomalacia within the anterior frontal lobes and anterior right temporal lobe, with associated chronic blood products. Background advanced patchy and confluent T2 FLAIR hyperintense signal abnormality within the cerebral white matter, nonspecific but compatible with chronic small vessel ischemic disease. Mild chronic small vessel ischemic changes are also present within the pons. No evidence of an intracranial mass. No extra-axial fluid collection. No midline shift. Partially empty sella turcica. Vascular: No definite loss of expected proximal arterial flow voids. Skull and upper cervical spine: No focal suspicious marrow lesion. Incompletely assessed cervical spondylosis. Sinuses/Orbits: Visualized orbits show no acute finding. Mild-to-moderate mucosal thickening within the bilateral ethmoid and right maxillary sinuses. Superimposed small volume frothy secretions within the right maxillary sinus. Mild mucosal thickening also present within the bilateral sphenoid and left maxillary sinuses. Other: 13 mm left parietal scalp lesion, nonspecific but possibly reflecting a sebaceous cyst. IMPRESSION: Moderate-sized  acute/early subacute left PCA territory infarct affecting the cortical/subcortical left parietooccipital lobes and callosal splenium. Redemonstrated chronic encephalomalacia and associated chronic blood products in the anterior frontal lobes, and anterior right temporal lobe, presumably posttraumatic in etiology. Chronic small-vessel ischemic changes which are severe in the cerebral white matter, and mild in the pons. Mild-to-moderate generalized cerebral atrophy. Comparatively mild cerebellar atrophy. Paranasal sinus disease, as described. Electronically Signed   By: Jackey Loge D.O.   On: 11/28/2020 16:02   DG Chest Port 1 View  Result Date: 11/29/2020 CLINICAL DATA:  Shortness of breath.  Altered mental status. EXAM: PORTABLE CHEST 1 VIEW COMPARISON:  11/28/2020 FINDINGS: The patient is rotated to the right with grossly unchanged cardiomediastinal silhouette. Eventration of the right hemidiaphragm is again noted. No confluent airspace opacity, edema, sizable pleural effusion, or pneumothorax is identified. Thoracic levoscoliosis is noted. IMPRESSION: No active disease. Electronically Signed   By: Sebastian Ache M.D.   On: 11/29/2020 05:31   DG Swallowing Func-Speech Pathology  Result Date: 12/03/2020 Table formatting from the original result was not included. Objective Swallowing Evaluation: Type of Study: MBS-Modified Barium Swallow Study  Patient Details  Name: Ann Johns MRN: [id number] Date of Birth: 03/24/1933 Today's Date: 12/03/2020 Time: SLP Start Time (ACUTE ONLY): 1516 -SLP Stop Time (ACUTE ONLY): 1540 SLP Time Calculation (min) (ACUTE ONLY): 24 min Past Medical History: Past Medical History: Diagnosis Date  Cerebrovascular disease 10/03/2020  ICH (intracerebral hemorrhage) (HCC) 11/01/2017  Right frontal  Memory change 10/03/2020  Seizures (HCC) 11/01/2017 Past Surgical History: Past Surgical History: Procedure Laterality Date  VAGINAL HYSTERECTOMY   HPI: 87 yo female who presented on 10/13,  brought to hospital by her son from her ALF after weakness and multiple falls. 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 PMH ICH right frontal lobe 2019, seizures, dementia, HTN.  Subjective: Pt was pleasant and cooperative with son at bedside Assessment / Plan / Recommendation CHL IP CLINICAL IMPRESSIONS 12/03/2020 Clinical Impression Pt demonstrated moderate oropharyngeal dysphagia with aspiration present. Oral delays, decreased cohesion and reduced manipulation describe her oral phase. Pt's epiglottis did not fully invert during all swallows and timing of laryngeal closure was late intermittently leading to penetration to vocal cords (PAS 5) and aspiration with straw (PAS 8). Material also penetrated and aspirated during multiple swallows. Reduced lingual retraction led to vallecular residue and pyriform residue present with mild increase in volume after thicker consistencies. Given pt's cognitive state, compensatory techniques were not attempted. Recommend continue soft texture and upgrade to honey thick liquids, multiple swallows, throat clear intermittently, crush pills and full supervision. SLP Visit Diagnosis Dysphagia, oropharyngeal phase (R13.12) Attention and concentration deficit following -- Frontal lobe and executive function deficit following -- Impact on safety and function Moderate aspiration risk;Severe aspiration risk   CHL IP TREATMENT RECOMMENDATION 12/03/2020 Treatment Recommendations Therapy as outlined in treatment plan below   Prognosis 12/03/2020 Prognosis for Safe Diet Advancement Fair Barriers to Reach Goals Cognitive deficits Barriers/Prognosis Comment -- CHL IP DIET RECOMMENDATION 12/03/2020 SLP Diet Recommendations Honey thick liquids;Other (Comment) Liquid Administration via Cup Medication Administration Crushed with puree Compensations Minimize environmental distractions;Slow rate;Small sips/bites;Multiple dry swallows after each bite/sip;Clear throat  intermittently Postural Changes Seated upright at 90 degrees   CHL IP OTHER RECOMMENDATIONS 12/03/2020 Recommended Consults -- Oral Care Recommendations Oral care BID Other Recommendations --   CHL IP FOLLOW UP RECOMMENDATIONS 12/03/2020 Follow up Recommendations Skilled Nursing facility   [facility] IP FREQUENCY AND DURATION 12/03/2020 Speech Therapy Frequency (ACUTE ONLY) min 2x/week Treatment Duration 2 weeks      CHL IP ORAL PHASE 12/03/2020 Oral Phase Impaired Oral - Pudding Teaspoon -- Oral - Pudding Cup -- Oral - Honey Teaspoon -- Oral - Honey Cup Delayed oral transit Oral - Nectar Teaspoon -- Oral - Nectar Cup Delayed oral transit Oral - Nectar Straw -- Oral - Thin Teaspoon -- Oral - Thin Cup Decreased bolus cohesion;Lingual/palatal residue Oral - Thin Straw Decreased bolus cohesion Oral - Puree Delayed oral transit Oral - Mech Soft Weak lingual manipulation Oral - Regular Impaired mastication Oral - Multi-Consistency -- Oral - Pill -- Oral Phase - Comment --  CHL IP PHARYNGEAL PHASE 12/03/2020 Pharyngeal Phase Impaired Pharyngeal- Pudding Teaspoon -- Pharyngeal -- Pharyngeal- Pudding Cup -- Pharyngeal -- Pharyngeal- Honey Teaspoon -- Pharyngeal -- Pharyngeal- Honey Cup -- Pharyngeal -- Pharyngeal- Nectar Teaspoon -- Pharyngeal -- Pharyngeal- Nectar Cup -- Pharyngeal -- Pharyngeal- Nectar Straw -- Pharyngeal -- Pharyngeal- Thin Teaspoon -- Pharyngeal -- Pharyngeal- Thin Cup Reduced epiglottic inversion;Pharyngeal residue - valleculae;Pharyngeal residue - pyriform;Penetration/Aspiration during swallow;Reduced airway/laryngeal closure Pharyngeal Material enters airway, remains ABOVE vocal cords then ejected out;Material enters airway, remains  ABOVE vocal cords and not ejected out Pharyngeal- Thin Straw Penetration/Aspiration during swallow;Pharyngeal residue - valleculae;Pharyngeal residue - pyriform Pharyngeal Material enters airway, CONTACTS cords and not ejected out;Material enters airway, passes BELOW cords  without attempt by patient to eject out (silent aspiration) Pharyngeal- Puree Pharyngeal residue - valleculae Pharyngeal -- Pharyngeal- Mechanical Soft Pharyngeal residue - valleculae Pharyngeal -- Pharyngeal- Regular Pharyngeal residue - valleculae Pharyngeal -- Pharyngeal- Multi-consistency -- Pharyngeal -- Pharyngeal- Pill -- Pharyngeal -- Pharyngeal Comment --  CHL IP CERVICAL ESOPHAGEAL PHASE 12/03/2020 Cervical Esophageal Phase WFL Pudding Teaspoon -- Pudding Cup -- Honey Teaspoon -- Honey Cup -- Nectar Teaspoon -- Nectar Cup -- Nectar Straw -- Thin Teaspoon -- Thin Cup -- Thin Straw -- Puree -- Mechanical Soft -- Regular -- Multi-consistency -- Pill -- Cervical Esophageal Comment -- Royce Macadamia 12/03/2020, 4:57 PM              EEG adult  Result Date: 11/29/2020 Charlsie Quest, MD     11/29/2020 12:33 PM Patient Name: Josiah Wojtaszek MRN: [id number] Epilepsy Attending: Charlsie Quest Referring Physician/Provider: Janey Genta, NP Date: 11/29/2020 Duration: 25.17 mins Patient history: 87yo F with ams. EEG to evaluate for seizure Level of alertness: Awake, asleep AEDs during EEG study: None Technical aspects: This EEG study was done with scalp electrodes positioned according to the 10-20 International system of electrode placement. Electrical activity was acquired at a sampling rate of 500Hz  and reviewed with a high frequency filter of 70Hz  and a low frequency filter of 1Hz . EEG data were recorded continuously and digitally stored. Description: The posterior dominant rhythm consists of 7.5 Hz activity of moderate voltage (25-35 uV) seen predominantly in posterior head regions, symmetric and reactive to eye opening and eye closing. Sleep was characterized by vertex waves, sleep spindles (12 to 14 Hz), maximal frontocentral region. EEG showed intermittent generalized 3 to 6 Hz theta-delta slowing. Hyperventilation and photic stimulation were not performed.   ABNORMALITY - Intermittent  slow, generalized IMPRESSION: This study is suggestive of mild diffuse encephalopathy, nonspecific etiology. No seizures or epileptiform discharges were seen throughout the recording. Charlsie Quest   ECHOCARDIOGRAM COMPLETE  Result Date: 11/29/2020    ECHOCARDIOGRAM REPORT   Patient Name:   SELETA HOVLAND Date of Exam: 11/29/2020 Medical Rec #:  [id number]     Height:       62.0 in Accession #:    [id number]    Weight:       114.0 lb Date of Birth:  [birth year]     BSA:          1.505 m Patient Age:    87 years      BP:           157/82 mmHg Patient Gender: F             HR:           98 bpm. Exam Location:  Inpatient Procedure: 2D Echo, Cardiac Doppler, Color Doppler and Intracardiac            Opacification Agent Indications:    Stroke I63.9  History:        Patient has prior history of Echocardiogram examinations, most                 recent 11/12/2017.  Sonographer:    Leta Jungling RDCS Referring Phys: Heide Scales [facility]  Sonographer Comments: Technically difficult study due to poor echo windows. IMPRESSIONS  1. Left ventricular ejection fraction, by  estimation, is 70 to 75%. The left ventricle has hyperdynamic function. The left ventricle has no regional wall motion abnormalities. There is moderate asymmetric left ventricular hypertrophy of the basal-septal  segment. Indeterminate diastolic filling due to E-A fusion.  2. Right ventricular systolic function is normal. The right ventricular size is normal.  3. The mitral valve is grossly normal. Mild mitral valve regurgitation. No evidence of mitral stenosis.  4. The aortic valve was not well visualized. There is mild calcification of the aortic valve. Aortic valve regurgitation is mild to moderate. No aortic stenosis is present. Conclusion(s)/Recommendation(s): No intracardiac source of embolism detected on this transthoracic study. A transesophageal echocardiogram is recommended to exclude cardiac source of embolism if clinically indicated. FINDINGS   Left Ventricle: Left ventricular ejection fraction, by estimation, is 70 to 75%. The left ventricle has hyperdynamic function. The left ventricle has no regional wall motion abnormalities. Definity contrast agent was given IV to delineate the left ventricular endocardial borders. The left ventricular internal cavity size was normal in size. There is moderate asymmetric left ventricular hypertrophy of the basal-septal segment. Indeterminate diastolic filling due to E-A fusion. Right Ventricle: The right ventricular size is normal. No increase in right ventricular wall thickness. Right ventricular systolic function is normal. Left Atrium: Left atrial size was normal in size. Right Atrium: Right atrial size was normal in size. Pericardium: There is no evidence of pericardial effusion. Presence of pericardial fat pad. Mitral Valve: The mitral valve is grossly normal. Mild mitral valve regurgitation. No evidence of mitral valve stenosis. Tricuspid Valve: The tricuspid valve is normal in structure. Tricuspid valve regurgitation is mild . No evidence of tricuspid stenosis. Aortic Valve: The aortic valve was not well visualized. There is mild calcification of the aortic valve. Aortic valve regurgitation is mild to moderate. No aortic stenosis is present. Pulmonic Valve: The pulmonic valve was not well visualized. Pulmonic valve regurgitation is mild to moderate. No evidence of pulmonic stenosis. Aorta: The aortic root is normal in size and structure. Venous: The inferior vena cava was not well visualized. IAS/Shunts: The interatrial septum was not well visualized.  LEFT VENTRICLE PLAX 2D LVIDd:         3.40 cm   Diastology LVIDs:         2.90 cm   LV e' medial:    5.00 cm/s LV PW:         0.90 cm   LV E/e' medial:  12.0 LV IVS:        1.30 cm   LV e' lateral:   5.66 cm/s LVOT diam:     2.10 cm   LV E/e' lateral: 10.6 LV SV:         38 LV SV Index:   25 LVOT Area:     3.46 cm  RIGHT VENTRICLE RV S prime:     16.80 cm/s LEFT  ATRIUM           Index LA diam:      2.70 cm 1.79 cm/m LA Vol (A4C): 14.5 ml 9.63 ml/m  AORTIC VALVE LVOT Vmax:   72.70 cm/s LVOT Vmean:  49.000 cm/s LVOT VTI:    0.109 m  AORTA Ao Root diam: 3.30 cm Ao Asc diam:  3.20 cm MITRAL VALVE                TRICUSPID VALVE MV Area (PHT): 5.46 cm     TR Peak grad:   23.2 mmHg MV Decel Time: 139 msec  TR Vmax:        241.00 cm/s MV E velocity: 59.75 cm/s MV A velocity: 104.00 cm/s  SHUNTS MV E/A ratio:  0.57         Systemic VTI:  0.11 m                             Systemic Diam: 2.10 cm Weston Brass MD Electronically signed by Weston Brass MD Signature Date/Time: 11/29/2020/6:57:24 PM    Final    VAS US CAROTID (at [facility] and [facility] only)  Result Date: 11/29/2020 Carotid Arterial Duplex Study Patient Name:  ANEL CREIGHTON  Date of Exam:   11/29/2020 Medical Rec #: [id number]      Accession #:    [id number] Date of Birth: [birth year]      Patient Gender: F Patient Age:   49 years Exam Location:  [facility] Procedure:      VAS US CAROTID Referring Phys: Terrilee Files [facility] --------------------------------------------------------------------------------  Indications:       CVA and recent falls, weakness, COVID+. Risk Factors:      Hypertension. Other Factors:     HX of ICH (2019). Comparison Study:  Previous exam 12/07/2017 1-39% BIL Performing Technologist: Ernestene Mention RVT, RDMS  Examination Guidelines: A complete evaluation includes B-mode imaging, spectral Doppler, color Doppler, and power Doppler as needed of all accessible portions of each vessel. Bilateral testing is considered an integral part of a complete examination. Limited examinations for reoccurring indications may be performed as noted.  Right Carotid Findings: +----------+--------+--------+--------+------------------+------------------+           PSV cm/sEDV cm/sStenosisPlaque DescriptionComments           +----------+--------+--------+--------+------------------+------------------+ CCA Prox   61      9                                 intimal thickening +----------+--------+--------+--------+------------------+------------------+ CCA Distal62      14                                intimal thickening +----------+--------+--------+--------+------------------+------------------+ ICA Prox  35      10              calcific and focaltortuous           +----------+--------+--------+--------+------------------+------------------+ ICA Distal74      17                                tortuous           +----------+--------+--------+--------+------------------+------------------+ ECA       65      0               calcific                             +----------+--------+--------+--------+------------------+------------------+ +----------+--------+-------+----------------+-------------------+           PSV cm/sEDV cmsDescribe        Arm Pressure (mmHG) +----------+--------+-------+----------------+-------------------+ [id number]             Multiphasic, WNL                    +----------+--------+-------+----------------+-------------------+ +---------+--------+--+--------+--+---------+ VertebralPSV cm/s58EDV cm/s11Antegrade +---------+--------+--+--------+--+---------+  Left Carotid Findings: +----------+--------+--------+--------+------------------+---------------------+  PSV cm/sEDV cm/sStenosisPlaque DescriptionComments              +----------+--------+--------+--------+------------------+---------------------+ CCA Prox  53      11                                                      +----------+--------+--------+--------+------------------+---------------------+ CCA Distal49      14                                intimal thickening    +----------+--------+--------+--------+------------------+---------------------+ ICA Prox  41      14              calcific          intimal thickening &                                                       tortuous              +----------+--------+--------+--------+------------------+---------------------+ ICA Distal101     27                                                      +----------+--------+--------+--------+------------------+---------------------+ ECA       40      0               calcific                                +----------+--------+--------+--------+------------------+---------------------+ +----------+--------+--------+----------------+-------------------+           PSV cm/sEDV cm/sDescribe        Arm Pressure (mmHG) +----------+--------+--------+----------------+-------------------+ Subclavian102             Multiphasic, WNL                    +----------+--------+--------+----------------+-------------------+ +---------+--------+--+--------+--+---------+ VertebralPSV cm/s52EDV cm/s11Antegrade +---------+--------+--+--------+--+---------+   Summary: Right Carotid: Velocities in the right ICA are consistent with a 1-39% stenosis.                The extracranial vessels were near-normal with only minimal wall                thickening or plaque. Left Carotid: Velocities in the left ICA are consistent with a 1-39% stenosis.               The extracranial vessels were near-normal with only minimal wall               thickening or plaque. Vertebrals:  Bilateral vertebral arteries demonstrate antegrade flow. Subclavians: Normal flow hemodynamics were seen in bilateral subclavian              arteries. *See table(s) above for measurements and observations.  Electronically signed by Sherald Hess MD on 11/29/2020 at 4:10:26 PM.    Final     Assessment/Plan  1. Cerebrovascular accident (CVA) due to embolism of left posterior  cerebral artery (HCC) -  MRI without contrast on 11/28/2020 showed moderate sized acute/early subacute left PCA territory infarct affecting the cortical/subcortical left parietal occipital lobes and callosal splenium.   She was seen by neurology and recommended dual antiplatelet therapy with aspirin and Plavix for 3 weeks then aspirin alone.  -    Continue atorvastatin 40 mg daily at bedtime -For PT and OT, for therapeutic strengthening exercises   2. COVID-19 virus infection -   had 2 doses COVID-19 vaccination and was treated with short course of Decadron IV and remdesivir.  She completed her isolation protocol in the hospital.  3. Urinary tract infection without hematuria, site unspecified -   Was treated with Rocephin x3, negative cultures  4. Essential hypertension -    Was a started metoprolol tartrate 25 mg twice a day on 12/03/2020  5. Dementia without behavioral disturbance (HCC) -   Continue Namenda 10 mg twice a day     6.  Oropharyngeal dysphagia -     will continue on pured diet with nectar thick liquids and will be followed up by speech therapy -    Aspiration precautions   Family/ staff Communication: Discussed plan of care with resident and charge nurse.  Labs/tests ordered: For CBC and BMP on 12/17/2020  Goals of care:   Short-term care   Kenard Gower, DNP, MSN, FNP-BC [facility] [phone] (Monday-Friday 8:00 a.m. - 5:00 p.m.) [phone] (after hours)

## 2020-12-11 ENCOUNTER — Non-Acute Institutional Stay (SKILLED_NURSING_FACILITY): Payer: Medicare Other | Admitting: Internal Medicine

## 2020-12-11 ENCOUNTER — Encounter: Payer: Self-pay | Admitting: Internal Medicine

## 2020-12-11 DIAGNOSIS — R4189 Other symptoms and signs involving cognitive functions and awareness: Secondary | ICD-10-CM

## 2020-12-11 DIAGNOSIS — U071 COVID-19: Secondary | ICD-10-CM | POA: Diagnosis not present

## 2020-12-11 DIAGNOSIS — I63432 Cerebral infarction due to embolism of left posterior cerebral artery: Secondary | ICD-10-CM | POA: Diagnosis not present

## 2020-12-11 DIAGNOSIS — E441 Mild protein-calorie malnutrition: Secondary | ICD-10-CM | POA: Diagnosis not present

## 2020-12-11 DIAGNOSIS — I700 Atherosclerosis of aorta: Secondary | ICD-10-CM | POA: Diagnosis not present

## 2020-12-11 DIAGNOSIS — R29818 Other symptoms and signs involving the nervous system: Secondary | ICD-10-CM

## 2020-12-11 NOTE — Progress Notes (Signed)
NURSING HOME LOCATION:  [facility] ROOM NUMBER:  [id number]  CODE STATUS:  Full Code  PCP:  Maryelizabeth Rowan MD  This is a comprehensive admission note to this SNFperformed on this date less than 30 days from date of admission. Included are preadmission medical/surgical history; reconciled medication list; family history; social history and comprehensive review of systems.  Corrections and additions to the records were documented. Comprehensive physical exam was also performed. Additionally a clinical summary was entered for each active diagnosis pertinent to this admission in the Problem List to enhance continuity of care.  HPI: Patient was hospitalized 10/13 - 12/09/2020 admitted from an ALF after falling complicated by persistent weakness.  After second fall her son brought her to the ED where she was diagnosed with acute CVA, UTI, and COVID-19 infection. Left PCA stroke was present in the subcortical left parieto-occipital lobes and callosal splenium.  Neurology recommended dual antiplatelet therapy with aspirin and Plavix for 3 weeks and then aspirin alone.  Statin was to be continued for dyslipidemia. PT/OT/SLP consulted and recommended continuing on honey thick, dysphagia 3 diet. She was diagnosed with a UTI and received Rocephin x3 days; but culture revealed no dominant organism & < 10,000 colonies. She has a history of being vaccinated x2 with the Pfizer vaccine but Covid screen was positive.  She had a dry cough with moderate elevation inflammatory markers for which short course of IV Decadron and remdesivir were administered. Low-dose Lopressor was added 10/18 for hypertension. 10/16 total protein 6 and albumin 3.2 documenting protein/caloric malnutrition.  Past medical and surgical history: Includes past history of intracerebral hemorrhage in 2019, history of seizures, and memory deficit.  Surgeries and procedures include vaginal hysterectomy.  Social history:  Currently nondrinker and non-smoker.  Family history: Noncontributory due to advanced age.   Review of systems: Clinical neurocognitive deficits made validity of responses questionable . Date given as "Sunday, November, 2022".  She did identify the POTUS correctly.  She stated that she had been in the hospital "with a stroke & had to recover".  When I asked if any thing else has been documented, she stated that I would need to ask her son.  Initially she stated she had no active symptoms.  She stated that she was very active engaged in yoga and walking.  She does state that she quit driving but could not tell me why, referring to the cause as "something".  As I went through the review of systems she did have several minor, intermittent symptoms.  She describes a crick in her neck as if she slept wrong.  She states that she has a "little depression" at times.  She also describes occasional diarrhea.  She has an intermittent cough but no other infectious symptoms.  Constitutional: No fever, significant weight change, fatigue  Eyes: No redness, discharge, pain, vision change ENT/mouth: No nasal congestion, purulent discharge, earache, change in hearing, sore throat  Cardiovascular: No chest pain, palpitations, paroxysmal nocturnal dyspnea, claudication, edema  Respiratory: No hemoptysis, DOE, significant snoring, apnea  Gastrointestinal: No heartburn,  abdominal pain, nausea /vomiting, rectal bleeding, melena Genitourinary: No dysuria, hematuria, pyuria, incontinence, nocturia Dermatologic: No rash, pruritus, change in appearance of skin Neurologic: No dizziness, headache, syncope, seizures, numbness, tingling Psychiatric: No significant  insomnia, anorexia Endocrine: No change in hair/skin/nails, excessive thirst, excessive hunger, excessive urination  Hematologic/lymphatic: No significant bruising, lymphadenopathy, abnormal bleeding Allergy/immunology: No itchy/watery eyes, significant sneezing,  urticaria, angioedema  Physical exam:  Pertinent or positive  findings: She is a delightful individual very communicative but pleasantly confused.  She sits in the wheelchair holding a stuffed toy, a small dog.  She stated that she had not named him yet though she had had him for a while.  The neurocognitive deficit is subtle in context of the dramatic CNS imaging findings.  The left pupil is bigger than the right. There is minimal asymmetry of the nasolabial folds.  She has low-grade expiratory rhonchi but forces the expiration.  She has minor rales at the bases.  Pedal pulses are decreased.  Strength to opposition is fair-good.  She has interosseous wasting and limb atrophy.  General appearance:  no acute distress, increased work of breathing is present.   Lymphatic: No lymphadenopathy about the head, neck, axilla. Eyes: No conjunctival inflammation or lid edema is present. There is no scleral icterus. Ears:  External ear exam shows no significant lesions or deformities.   Nose:  External nasal examination shows no deformity or inflammation. Nasal mucosa are pink and moist without lesions, exudates Oral exam: Lips and gums are healthy appearing.There is no oropharyngeal erythema or exudate. Neck:  No thyromegaly, masses, tenderness noted.    Heart:  Normal rate and regular rhythm. S1 and S2 normal without gallop, murmur, click, rub.  Abdomen: Bowel sounds are normal.  Abdomen is soft and nontender with no organomegaly, hernias, masses. GU: Deferred  Extremities:  No cyanosis, clubbing, edema. Neurologic exam:Balance, Rhomberg, finger to nose testing could not be completed due to clinical state Skin: Warm & dry w/o tenting. No significant lesions or rash.  See clinical summary under each active problem in the Problem List with associated updated therapeutic plan

## 2020-12-11 NOTE — Patient Instructions (Signed)
See assessment and plan under each diagnosis in the problem list and acutely for this visit 

## 2020-12-12 DIAGNOSIS — E46 Unspecified protein-calorie malnutrition: Secondary | ICD-10-CM | POA: Insufficient documentation

## 2020-12-12 NOTE — Assessment & Plan Note (Signed)
Clinically no active COVID symptoms.

## 2020-12-12 NOTE — Assessment & Plan Note (Signed)
Nutritionist will assess at the SNF.

## 2020-12-12 NOTE — Assessment & Plan Note (Signed)
It is unlikely that Namenda is of therapeutic value; but discontinuing it might result in some mood issues.  At this time she is pleasantly confused without behavioral issues.

## 2020-12-12 NOTE — Assessment & Plan Note (Signed)
Currently no symptoms.  Despite advanced age; aspirin and statin should be continued as secondary prevention.

## 2020-12-12 NOTE — Assessment & Plan Note (Signed)
Clinically stable; participating in PT/OT.

## 2020-12-18 ENCOUNTER — Non-Acute Institutional Stay (SKILLED_NURSING_FACILITY): Payer: Medicare Other | Admitting: Adult Health

## 2020-12-18 ENCOUNTER — Encounter: Payer: Self-pay | Admitting: Adult Health

## 2020-12-18 DIAGNOSIS — F339 Major depressive disorder, recurrent, unspecified: Secondary | ICD-10-CM

## 2020-12-18 DIAGNOSIS — I63432 Cerebral infarction due to embolism of left posterior cerebral artery: Secondary | ICD-10-CM

## 2020-12-18 DIAGNOSIS — R1312 Dysphagia, oropharyngeal phase: Secondary | ICD-10-CM | POA: Diagnosis not present

## 2020-12-18 DIAGNOSIS — F0390 Unspecified dementia without behavioral disturbance: Secondary | ICD-10-CM

## 2020-12-18 DIAGNOSIS — I10 Essential (primary) hypertension: Secondary | ICD-10-CM | POA: Diagnosis not present

## 2020-12-18 LAB — BASIC METABOLIC PANEL
BUN: 8 (ref 4–21)
CO2: 25 — AB (ref 13–22)
Chloride: 105 (ref 99–108)
Creatinine: 0.3 — AB (ref 0.5–1.1)
Glucose: 103
Potassium: 4.3 (ref 3.4–5.3)
Sodium: 143 (ref 137–147)

## 2020-12-18 LAB — CBC AND DIFFERENTIAL
HCT: 32 — AB (ref 36–46)
Hemoglobin: 10.8 — AB (ref 12.0–16.0)
Platelets: 318 (ref 150–399)
WBC: 11.6

## 2020-12-18 LAB — COMPREHENSIVE METABOLIC PANEL: Calcium: 9 (ref 8.7–10.7)

## 2020-12-18 LAB — CBC: RBC: 3.47 — AB (ref 3.87–5.11)

## 2020-12-18 NOTE — Progress Notes (Signed)
Location:  [place] Room Number: [id number] Place of Service:  SNF (31) Provider:  Durenda Age, DNP, FNP-BC  Patient Care Team: [name], MD as PCP - General (Family Medicine) Reymundo Poll, MD as Referring Physician [facility] Medicine)  Extended Emergency Contact Information Primary Emergency Contact: KUMARI, YASSIN Mobile Phone: [phone] Relation: Son Secondary Emergency Contact: SKYLIN, TANGNEY Mobile Phone: [phone] Relation: Daughter  Code Status: Full Code    Goals of care: Advanced Directive information Advanced Directives 12/18/2020  Does Patient Have a Medical Advance Directive? Yes  Type of Paramedic of [place];Living will  Does patient want to make changes to medical advance directive? No - Patient declined  Copy of [place] in Chart? Yes - validated most recent copy scanned in chart (See row information)  Would patient like information on creating a medical advance directive? -     Chief Complaint  Patient presents with   Acute Visit    Short-term care follow-up    HPI:  Pt is a 87 y.o. female seen today for short-term care visit. She is currently having PT, OT and ST. SBPs ranging from 112 to 129. She takes metoprolol titrate 25 mg twice a day for hypertension.  She takes Plavix 75 mg daily ~12/20/2020 and aspirin 81 mg daily for CVA. Latest PHQ-9 score is 0, no depressed mood. She takes sertraline 25 mg 1 tab daily for depression.  Latest BIMS score 6/15, ranging in severe cognitive impairment.  She takes Namenda 10 mg 1 tab twice a day for dementia.  Past Medical History:  Diagnosis Date   Cerebrovascular disease 10/03/2020   ICH (intracerebral hemorrhage) ([place]) 11/01/2017   Right frontal   Memory change 10/03/2020   Seizures ([place]) 11/01/2017   Past Surgical History:  Procedure Laterality Date   VAGINAL HYSTERECTOMY      Allergies  Allergen Reactions   Sulfa  Antibiotics Rash    Outpatient Encounter Medications as of 12/18/2020  Medication Sig   acetaminophen (TYLENOL) 325 MG tablet Take 2 tablets (650 mg total) by mouth every 6 (six) hours as needed for mild pain, fever or headache (or Fever >/= 101).   aspirin 81 MG chewable tablet Chew 1 tablet (81 mg total) by mouth daily.   atorvastatin (LIPITOR) 40 MG tablet Take 1 tablet (40 mg total) by mouth daily.   bisacodyl (DULCOLAX) 5 MG EC tablet Take 1 tablet (5 mg total) by mouth daily as needed for moderate constipation.   [EXPIRED] clopidogrel (PLAVIX) 75 MG tablet Take 1 tablet (75 mg total) by mouth daily for 10 days. Stop on 12/20/2020   [EXPIRED] guaiFENesin (ROBITUSSIN) 100 MG/5ML liquid Take 5 mLs by mouth 3 (three) times daily. X 1 week for dry cough   memantine (NAMENDA) 10 MG tablet Take 1 tablet (10 mg total) by mouth 2 (two) times daily.   metoprolol tartrate (LOPRESSOR) 25 MG tablet Take 1 tablet (25 mg total) by mouth 2 (two) times daily.   Multiple Vitamins-Minerals (CENTRUM PO) Take 1 tablet by mouth daily.   Nutritional Supplements (NUTRITIONAL SHAKE PO) Take 1 Canister by mouth 3 (three) times daily. Magic cup   Probiotic Product (PROBIOTIC PO) Take 1 capsule by mouth daily.   sertraline (ZOLOFT) 25 MG tablet Take 25 mg by mouth daily.   VITAMIN D PO Take 75 mcg by mouth daily.   [DISCONTINUED] feeding supplement (ENSURE ENLIVE / ENSURE PLUS) LIQD Take 237 mLs by mouth 3 (three) times daily  between meals.   No facility-administered encounter medications on file as of 12/18/2020.    Review of Systems  GENERAL: No fever or chills  MOUTH and THROAT: Denies oral discomfort, gingival pain or bleeding RESPIRATORY: no cough, SOB, DOE, wheezing, hemoptysis CARDIAC: No chest pain, edema or palpitations GI: No abdominal pain, diarrhea, constipation, heart burn, nausea or vomiting GU: Denies dysuria, frequency, hematuria or discharge NEUROLOGICAL: Denies dizziness, syncope, numbness,  or headache PSYCHIATRIC: Denies feelings of depression or anxiety. No report of hallucinations, insomnia, paranoia, or agitation   Immunization History  Administered Date(s) Administered   Fluad Quad(high Dose 65+) 11/16/2020   Moderna SARS-COV2 Booster Vaccination 12/17/2019, 06/27/2020   Moderna Sars-Covid-2 Vaccination 03/09/2019, 04/06/2019   Pneumococcal Polysaccharide-23 07/02/2020   Tdap 12/24/2018   Pertinent  Health Maintenance Due  Topic Date Due   DEXA SCAN  Never done   INFLUENZA VACCINE  Completed   Fall Risk 12/07/2020 12/07/2020 12/08/2020 12/08/2020 12/09/2020  Patient Fall Risk Level High fall risk High fall risk High fall risk High fall risk High fall risk     Vitals:   12/18/20 1551  BP: 133/65  Pulse: 78  Resp: 17  Temp: (!) 97.3 F (36.3 C)  Weight: 108 lb (49 kg)  Height: 5\' 2"  (1.575 m)   Body mass index is 19.75 kg/m.  Physical Exam  GENERAL APPEARANCE:  In no acute distress.  SKIN:  Skin is warm and dry.  MOUTH and THROAT: Lips are without lesions. Oral mucosa is moist and without lesions.  RESPIRATORY: Breathing is even & unlabored, BS CTAB CARDIAC: RRR, no murmur,no extra heart sounds, no edema GI: Abdomen soft, normal BS, no masses, no tenderness NEUROLOGICAL: There is no tremor. Speech is clear. Alert to self, disoriented to time and place. PSYCHIATRIC:  Affect and behavior are appropriate  Labs reviewed: Recent Labs    11/29/20 0219 12/01/20 0359 12/06/20 0832 12/18/20 0000  NA 135 135 138 143  K 3.9 3.7 4.1 4.3  CL 103 103 103 105  CO2 17* 22 26 25*  GLUCOSE 184* 166* 128*  --   BUN 7* 13 16 8   CREATININE 0.62 0.50 0.58 0.3*  CALCIUM 9.0 8.8* 9.0 9.0  MG 1.8 1.9  --   --    Recent Labs    11/29/20 0219 12/01/20 0359  AST 25 22  ALT 15 16  ALKPHOS 75 62  BILITOT 1.3* 0.7  PROT 6.7 6.0*  ALBUMIN 3.6 3.2*   Recent Labs    11/28/20 1051 11/28/20 1701 11/29/20 0219 12/01/20 0359 12/06/20 0832 12/18/20 0000   WBC 10.0   < > 7.4 10.4 15.5* 11.6  NEUTROABS 7.5  --  6.2 8.2*  --   --   HGB 11.9*   < > 12.7 12.3 12.2 10.8*  HCT 35.4*   < > 39.1 35.6* 36.9 32*  MCV 91.5   < > 93.3 88.1 92.3  --   PLT 250   < > 269 320 370 318   < > = values in this interval not displayed.   No results found for: TSH Lab Results  Component Value Date   HGBA1C 5.7 (H) 11/29/2020   Lab Results  Component Value Date   CHOL 196 11/29/2020   HDL 48 11/29/2020   LDLCALC 133 (H) 11/29/2020   TRIG 75 11/29/2020   CHOLHDL 4.1 11/29/2020    Significant Diagnostic Results in last 30 days:  MR ANGIO HEAD WO CONTRAST  Result Date: 11/28/2020 CLINICAL DATA:  Acute neurologic deficit EXAM: MRA HEAD WITHOUT CONTRAST TECHNIQUE: Angiographic images of the Circle of Willis were acquired using MRA technique without intravenous contrast. COMPARISON:  No pertinent prior exam. FINDINGS: POSTERIOR CIRCULATION: --Vertebral arteries: Normal --Inferior cerebellar arteries: Normal. --Basilar artery: Normal. --Superior cerebellar arteries: Normal. --Posterior cerebral arteries: Mild narrowing of the right PCA P2 segment. Normal left. ANTERIOR CIRCULATION: --Intracranial internal carotid arteries: Normal. --Anterior cerebral arteries (ACA): Normal. --Middle cerebral arteries (MCA): Normal. ANATOMIC VARIANTS: None IMPRESSION: 1. No emergent large vessel occlusion or high-grade stenosis. 2. Mild narrowing of the right PCA P2 segment. Electronically Signed   By: Ulyses Jarred M.D.   On: 11/28/2020 23:20   DG Chest Port 1 View  Result Date: 11/29/2020 CLINICAL DATA:  Shortness of breath.  Altered mental status. EXAM: PORTABLE CHEST 1 VIEW COMPARISON:  11/28/2020 FINDINGS: The patient is rotated to the right with grossly unchanged cardiomediastinal silhouette. Eventration of the right hemidiaphragm is again noted. No confluent airspace opacity, edema, sizable pleural effusion, or pneumothorax is identified. Thoracic levoscoliosis is noted.  IMPRESSION: No active disease. Electronically Signed   By: Logan Bores M.D.   On: 11/29/2020 05:31   DG Swallowing Func-Speech Pathology  Result Date: 12/03/2020 Table formatting from the original result was not included. Objective Swallowing Evaluation: Type of Study: MBS-Modified Barium Swallow Study  Patient Details Name: Ann Johns MRN: [id number] Date of Birth: 1933/08/09 Today's Date: 12/03/2020 Time: SLP Start Time (ACUTE ONLY): 1516 -SLP Stop Time (ACUTE ONLY): 1540 SLP Time Calculation (min) (ACUTE ONLY): 24 min Past Medical History: Past Medical History: Diagnosis Date  Cerebrovascular disease 10/03/2020  ICH (intracerebral hemorrhage) ([place]) 11/01/2017  Right frontal  Memory change 10/03/2020  Seizures ([place]) 11/01/2017 Past Surgical History: Past Surgical History: Procedure Laterality Date  VAGINAL HYSTERECTOMY   HPI: 87 yo female who presented on 10/13, brought to hospital by her son from her ALF after weakness and multiple falls. 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 PMH ICH right frontal lobe 2019, seizures, dementia, HTN.  Subjective: Pt was pleasant and cooperative with son at bedside Assessment / Plan / Recommendation CHL IP CLINICAL IMPRESSIONS 12/03/2020 Clinical Impression Pt demonstrated moderate oropharyngeal dysphagia with aspiration present. Oral delays, decreased cohesion and reduced manipulation describe her oral phase. Pt's epiglottis did not fully invert during all swallows and timing of laryngeal closure was late intermittently leading to penetration to vocal cords (PAS 5) and aspiration with straw (PAS 8). Material also penetrated and aspirated during multiple swallows. Reduced lingual retraction led to vallecular residue and pyriform residue present with mild increase in volume after thicker consistencies. Given pt's cognitive state, compensatory techniques were not attempted. Recommend continue soft texture and upgrade to honey thick liquids, multiple  swallows, throat clear intermittently, crush pills and full supervision. SLP Visit Diagnosis Dysphagia, oropharyngeal phase (R13.12) Attention and concentration deficit following -- Frontal lobe and executive function deficit following -- Impact on safety and function Moderate aspiration risk;Severe aspiration risk   CHL IP TREATMENT RECOMMENDATION 12/03/2020 Treatment Recommendations Therapy as outlined in treatment plan below   Prognosis 12/03/2020 Prognosis for Safe Diet Advancement Fair Barriers to Reach Goals Cognitive deficits Barriers/Prognosis Comment -- CHL IP DIET RECOMMENDATION 12/03/2020 SLP Diet Recommendations Honey thick liquids;Other (Comment) Liquid Administration via Cup Medication Administration Crushed with puree Compensations Minimize environmental distractions;Slow rate;Small sips/bites;Multiple dry swallows after each bite/sip;Clear throat intermittently Postural Changes Seated upright at 90 degrees   CHL IP OTHER RECOMMENDATIONS 12/03/2020 Recommended Consults -- Oral Care Recommendations  Oral care BID Other Recommendations --   CHL IP FOLLOW UP RECOMMENDATIONS 12/03/2020 Follow up Recommendations Skilled Nursing facility   [facility] IP FREQUENCY AND DURATION 12/03/2020 Speech Therapy Frequency (ACUTE ONLY) min 2x/week Treatment Duration 2 weeks      CHL IP ORAL PHASE 12/03/2020 Oral Phase Impaired Oral - Pudding Teaspoon -- Oral - Pudding Cup -- Oral - Honey Teaspoon -- Oral - Honey Cup Delayed oral transit Oral - Nectar Teaspoon -- Oral - Nectar Cup Delayed oral transit Oral - Nectar Straw -- Oral - Thin Teaspoon -- Oral - Thin Cup Decreased bolus cohesion;Lingual/palatal residue Oral - Thin Straw Decreased bolus cohesion Oral - Puree Delayed oral transit Oral - Mech Soft Weak lingual manipulation Oral - Regular Impaired mastication Oral - Multi-Consistency -- Oral - Pill -- Oral Phase - Comment --  CHL IP PHARYNGEAL PHASE 12/03/2020 Pharyngeal Phase Impaired Pharyngeal- Pudding Teaspoon --  Pharyngeal -- Pharyngeal- Pudding Cup -- Pharyngeal -- Pharyngeal- Honey Teaspoon -- Pharyngeal -- Pharyngeal- Honey Cup -- Pharyngeal -- Pharyngeal- Nectar Teaspoon -- Pharyngeal -- Pharyngeal- Nectar Cup -- Pharyngeal -- Pharyngeal- Nectar Straw -- Pharyngeal -- Pharyngeal- Thin Teaspoon -- Pharyngeal -- Pharyngeal- Thin Cup Reduced epiglottic inversion;Pharyngeal residue - valleculae;Pharyngeal residue - pyriform;Penetration/Aspiration during swallow;Reduced airway/laryngeal closure Pharyngeal Material enters airway, remains ABOVE vocal cords then ejected out;Material enters airway, remains ABOVE vocal cords and not ejected out Pharyngeal- Thin Straw Penetration/Aspiration during swallow;Pharyngeal residue - valleculae;Pharyngeal residue - pyriform Pharyngeal Material enters airway, CONTACTS cords and not ejected out;Material enters airway, passes BELOW cords without attempt by patient to eject out (silent aspiration) Pharyngeal- Puree Pharyngeal residue - valleculae Pharyngeal -- Pharyngeal- Mechanical Soft Pharyngeal residue - valleculae Pharyngeal -- Pharyngeal- Regular Pharyngeal residue - valleculae Pharyngeal -- Pharyngeal- Multi-consistency -- Pharyngeal -- Pharyngeal- Pill -- Pharyngeal -- Pharyngeal Comment --  CHL IP CERVICAL ESOPHAGEAL PHASE 12/03/2020 Cervical Esophageal Phase WFL Pudding Teaspoon -- Pudding Cup -- Honey Teaspoon -- Honey Cup -- Nectar Teaspoon -- Nectar Cup -- Nectar Straw -- Thin Teaspoon -- Thin Cup -- Thin Straw -- Puree -- Mechanical Soft -- Regular -- Multi-consistency -- Pill -- Cervical Esophageal Comment -- [place] Siren 12/03/2020, 4:57 PM              EEG adult  Result Date: 11/29/2020 Lora Havens, MD     11/29/2020 12:33 PM Patient Name: Ann Johns MRN: [id number] Epilepsy Attending: Lora Havens Referring Physician/Provider: Hetty Blend, NP Date: 11/29/2020 Duration: 25.17 mins Patient history: 87yo F with ams. EEG to evaluate for  seizure Level of alertness: Awake, asleep AEDs during EEG study: None Technical aspects: This EEG study was done with scalp electrodes positioned according to the 10-20 International system of electrode placement. Electrical activity was acquired at a sampling rate of 500Hz  and reviewed with a high frequency filter of 70Hz  and a low frequency filter of 1Hz . EEG data were recorded continuously and digitally stored. Description: The posterior dominant rhythm consists of 7.5 Hz activity of moderate voltage (25-35 uV) seen predominantly in posterior head regions, symmetric and reactive to eye opening and eye closing. Sleep was characterized by vertex waves, sleep spindles (12 to 14 Hz), maximal frontocentral region. EEG showed intermittent generalized 3 to 6 Hz theta-delta slowing. Hyperventilation and photic stimulation were not performed.   ABNORMALITY - Intermittent slow, generalized IMPRESSION: This study is suggestive of mild diffuse encephalopathy, nonspecific etiology. No seizures or epileptiform discharges were seen throughout the recording. Val[place]   ECHOCARDIOGRAM COMPLETE  Result  Date: 11/29/2020    ECHOCARDIOGRAM REPORT   Patient Name:   Ann Johns Date of Exam: 11/29/2020 Medical Rec #:  [id number]     Height:       62.0 in Accession #:    [id number]    Weight:       114.0 lb Date of Birth:  Mar 23, 1933     BSA:          1.505 m Patient Age:    87 years      BP:           157/82 mmHg Patient Gender: F             HR:           98 bpm. Exam Location:  Inpatient Procedure: 2D Echo, Cardiac Doppler, Color Doppler and Intracardiac            Opacification Agent Indications:    Stroke I63.9  History:        Patient has prior history of Echocardiogram examinations, most                 recent 11/12/2017.  Sonographer:    Leta Jungling RDCS Referring Phys: Heide Scales [facility]  Sonographer Comments: Technically difficult study due to poor echo windows. IMPRESSIONS  1. Left ventricular ejection  fraction, by estimation, is 70 to 75%. The left ventricle has hyperdynamic function. The left ventricle has no regional wall motion abnormalities. There is moderate asymmetric left ventricular hypertrophy of the basal-septal  segment. Indeterminate diastolic filling due to E-A fusion.  2. Right ventricular systolic function is normal. The right ventricular size is normal.  3. The mitral valve is grossly normal. Mild mitral valve regurgitation. No evidence of mitral stenosis.  4. The aortic valve was not well visualized. There is mild calcification of the aortic valve. Aortic valve regurgitation is mild to moderate. No aortic stenosis is present. Conclusion(s)/Recommendation(s): No intracardiac source of embolism detected on this transthoracic study. A transesophageal echocardiogram is recommended to exclude cardiac source of embolism if clinically indicated. FINDINGS  Left Ventricle: Left ventricular ejection fraction, by estimation, is 70 to 75%. The left ventricle has hyperdynamic function. The left ventricle has no regional wall motion abnormalities. Definity contrast agent was given IV to delineate the left ventricular endocardial borders. The left ventricular internal cavity size was normal in size. There is moderate asymmetric left ventricular hypertrophy of the basal-septal segment. Indeterminate diastolic filling due to E-A fusion. Right Ventricle: The right ventricular size is normal. No increase in right ventricular wall thickness. Right ventricular systolic function is normal. Left Atrium: Left atrial size was normal in size. Right Atrium: Right atrial size was normal in size. Pericardium: There is no evidence of pericardial effusion. Presence of pericardial fat pad. Mitral Valve: The mitral valve is grossly normal. Mild mitral valve regurgitation. No evidence of mitral valve stenosis. Tricuspid Valve: The tricuspid valve is normal in structure. Tricuspid valve regurgitation is mild . No evidence of  tricuspid stenosis. Aortic Valve: The aortic valve was not well visualized. There is mild calcification of the aortic valve. Aortic valve regurgitation is mild to moderate. No aortic stenosis is present. Pulmonic Valve: The pulmonic valve was not well visualized. Pulmonic valve regurgitation is mild to moderate. No evidence of pulmonic stenosis. Aorta: The aortic root is normal in size and structure. Venous: The inferior vena cava was not well visualized. IAS/Shunts: The interatrial septum was not well visualized.  LEFT VENTRICLE PLAX 2D LVIDd:  3.40 cm   Diastology LVIDs:         2.90 cm   LV e' medial:    5.00 cm/s LV PW:         0.90 cm   LV E/e' medial:  12.0 LV IVS:        1.30 cm   LV e' lateral:   5.66 cm/s LVOT diam:     2.10 cm   LV E/e' lateral: 10.6 LV SV:         38 LV SV Index:   25 LVOT Area:     3.46 cm  RIGHT VENTRICLE RV S prime:     16.80 cm/s LEFT ATRIUM           Index LA diam:      2.70 cm 1.79 cm/m LA Vol (A4C): 14.5 ml 9.63 ml/m  AORTIC VALVE LVOT Vmax:   72.70 cm/s LVOT Vmean:  49.000 cm/s LVOT VTI:    0.109 m  AORTA Ao Root diam: 3.30 cm Ao Asc diam:  3.20 cm MITRAL VALVE                TRICUSPID VALVE MV Area (PHT): 5.46 cm     TR Peak grad:   23.2 mmHg MV Decel Time: 139 msec     TR Vmax:        241.00 cm/s MV E velocity: 59.75 cm/s MV A velocity: 104.00 cm/s  SHUNTS MV E/A ratio:  0.57         Systemic VTI:  0.11 m                             Systemic Diam: 2.10 cm Cherlynn Kaiser MD Electronically signed by Cherlynn Kaiser MD Signature Date/Time: 11/29/2020/6:57:24 PM    Final    VAS US CAROTID (at [facility] and [facility] only)  Result Date: 11/29/2020 Carotid Arterial Duplex Study Patient Name:  Ann Johns  Date of Exam:   11/29/2020 Medical Rec #: [id number]      Accession #:    [id number] Date of Birth: 1933/04/19      Patient Gender: F Patient Age:   8 years Exam Location:  [facility] Procedure:      VAS US CAROTID Referring Phys: Alferd Patee [facility]  --------------------------------------------------------------------------------  Indications:       CVA and recent falls, weakness, COVID+. Risk Factors:      Hypertension. Other Factors:     HX of ICH (2019). Comparison Study:  Previous exam 12/07/2017 1-39% BIL Performing Technologist: Rogelia Rohrer RVT, RDMS  Examination Guidelines: A complete evaluation includes B-mode imaging, spectral Doppler, color Doppler, and power Doppler as needed of all accessible portions of each vessel. Bilateral testing is considered an integral part of a complete examination. Limited examinations for reoccurring indications may be performed as noted.  Right Carotid Findings: +----------+--------+--------+--------+------------------+------------------+           PSV cm/sEDV cm/sStenosisPlaque DescriptionComments           +----------+--------+--------+--------+------------------+------------------+ CCA Prox  61      9                                 intimal thickening +----------+--------+--------+--------+------------------+------------------+ CCA Distal62      14  intimal thickening +----------+--------+--------+--------+------------------+------------------+ ICA Prox  35      10              calcific and focaltortuous           +----------+--------+--------+--------+------------------+------------------+ ICA Distal74      17                                tortuous           +----------+--------+--------+--------+------------------+------------------+ ECA       65      0               calcific                             +----------+--------+--------+--------+------------------+------------------+ +----------+--------+-------+----------------+-------------------+           PSV cm/sEDV cmsDescribe        Arm Pressure (mmHG) +----------+--------+-------+----------------+-------------------+ [id number]             Multiphasic, WNL                     +----------+--------+-------+----------------+-------------------+ +---------+--------+--+--------+--+---------+ VertebralPSV cm/s58EDV cm/s11Antegrade +---------+--------+--+--------+--+---------+  Left Carotid Findings: +----------+--------+--------+--------+------------------+---------------------+           PSV cm/sEDV cm/sStenosisPlaque DescriptionComments              +----------+--------+--------+--------+------------------+---------------------+ CCA Prox  53      11                                                      +----------+--------+--------+--------+------------------+---------------------+ CCA Distal49      14                                intimal thickening    +----------+--------+--------+--------+------------------+---------------------+ ICA Prox  41      14              calcific          intimal thickening &                                                      tortuous              +----------+--------+--------+--------+------------------+---------------------+ ICA Distal101     27                                                      +----------+--------+--------+--------+------------------+---------------------+ ECA       40      0               calcific                                +----------+--------+--------+--------+------------------+---------------------+ +----------+--------+--------+----------------+-------------------+           PSV cm/sEDV cm/sDescribe  Arm Pressure (mmHG) +----------+--------+--------+----------------+-------------------+ Subclavian102             Multiphasic, WNL                    +----------+--------+--------+----------------+-------------------+ +---------+--------+--+--------+--+---------+ VertebralPSV cm/s52EDV cm/s11Antegrade +---------+--------+--+--------+--+---------+   Summary: Right Carotid: Velocities in the right ICA are consistent with a 1-39% stenosis.                 The extracranial vessels were near-normal with only minimal wall                thickening or plaque. Left Carotid: Velocities in the left ICA are consistent with a 1-39% stenosis.               The extracranial vessels were near-normal with only minimal wall               thickening or plaque. Vertebrals:  Bilateral vertebral arteries demonstrate antegrade flow. Subclavians: Normal flow hemodynamics were seen in bilateral subclavian              arteries. *See table(s) above for measurements and observations.  Electronically signed by Monica Martinez MD on 11/29/2020 at 4:10:26 PM.    Final     Assessment/Plan  1. Essential hypertension -  BPs stable, continue with metoprolol tartrate 25 mg twice a day -   Monitor BPs  2. Oropharyngeal dysphagia -Continue regular with thin liquid diet -   Speech therapy working on swallowing safely and cognitive issues  3. Cerebrovascular accident (CVA) due to embolism of left posterior cerebral artery (HCC) -   Stable, continue Plavix 75 mg 1 tab daily to 12/20/2020 and aspirin -   For PT and OT, for therapeutic strengthening exercises   4. Major depression, recurrent, chronic (HCC) -   PHQ-9 score is 0, no depressed mood -   Continue sertraline 25 mg daily  5. Dementia without behavioral disturbance ([place]) -  BIMS score 6/15, ranging in severe cognitive impairment -   Continue Namenda 10 mg 1 tab twice a day     Family/ staff Communication: Discussed plan of care with resident and charge nurse.  Labs/tests ordered: None  Goals of care:   Short-term care   Durenda Age, DNP, MSN, FNP-BC [facility] [phone] (Monday-Friday 8:00 a.m. - 5:00 p.m.) [phone] (after hours)

## 2020-12-30 ENCOUNTER — Encounter: Payer: Self-pay | Admitting: Adult Health

## 2020-12-30 ENCOUNTER — Non-Acute Institutional Stay (SKILLED_NURSING_FACILITY): Payer: Medicare Other | Admitting: Adult Health

## 2020-12-30 DIAGNOSIS — I10 Essential (primary) hypertension: Secondary | ICD-10-CM | POA: Diagnosis not present

## 2020-12-30 DIAGNOSIS — Z8673 Personal history of transient ischemic attack (TIA), and cerebral infarction without residual deficits: Secondary | ICD-10-CM | POA: Diagnosis not present

## 2020-12-30 DIAGNOSIS — F339 Major depressive disorder, recurrent, unspecified: Secondary | ICD-10-CM

## 2020-12-30 DIAGNOSIS — R1312 Dysphagia, oropharyngeal phase: Secondary | ICD-10-CM

## 2020-12-30 DIAGNOSIS — F0390 Unspecified dementia without behavioral disturbance: Secondary | ICD-10-CM

## 2020-12-30 NOTE — Progress Notes (Signed)
Location:  [place] Room Number: [place] of Service:  SNF (31) Provider:  Durenda Age, DNP, FNP-BC  Patient Care Team: [name], MD as PCP - General (Family Medicine) Reymundo Poll, MD as Referring Physician [facility] Medicine)  Extended Emergency Contact Information Primary Emergency Contact: ALVERN, CRASK Mobile Phone: [phone] Relation: Son Secondary Emergency Contact: NYARA, BORDER Mobile Phone: [phone] Relation: Daughter  Code Status:  FULL CODE  Goals of care: Advanced Directive information Advanced Directives 12/30/2020  Does Patient Have a Medical Advance Directive? Yes  Type of Paramedic of [place];Living will  Does patient want to make changes to medical advance directive? No - Patient declined  Copy of [place] in Chart? Yes - validated most recent copy scanned in chart (See row information)  Would patient like information on creating a medical advance directive? -     Chief Complaint  Patient presents with   Acute Visit    Short term rehab   Health Maintenance    Dexa scan    HPI:  Pt is a 87 y.o. female seen today for a short-term rehabilitation visit. She is currently having PT, OT and ST.  SBPs ranging from 116 to 141. She takes metoprolol titrate 25 mg twice a day for hypertension.  She walks with a walker.  She takes atorvastatin 40 mg daily and aspirin 81 mg daily for history of CVA. PHQ-9 score is 0 and currently taking sertraline 25 mg daily for depression. BIMS score 6/15, ranging in severe cognitive impairment.  Past Medical History:  Diagnosis Date   Cerebrovascular disease 10/03/2020   ICH (intracerebral hemorrhage) ([place]) 11/01/2017   Right frontal   Memory change 10/03/2020   Seizures ([place]) 11/01/2017   Past Surgical History:  Procedure Laterality Date   VAGINAL HYSTERECTOMY      Allergies  Allergen Reactions   Sulfa Antibiotics Rash     Outpatient Encounter Medications as of 12/30/2020  Medication Sig   acetaminophen (TYLENOL) 325 MG tablet Take 2 tablets (650 mg total) by mouth every 6 (six) hours as needed for mild pain, fever or headache (or Fever >/= 101).   aspirin 81 MG chewable tablet Chew 1 tablet (81 mg total) by mouth daily.   atorvastatin (LIPITOR) 40 MG tablet Take 1 tablet (40 mg total) by mouth daily.   bisacodyl (DULCOLAX) 5 MG EC tablet Take 1 tablet (5 mg total) by mouth daily as needed for moderate constipation.   Magnesium Hydroxide (MILK OF MAGNESIA PO) Take 30 mLs by mouth as needed.   memantine (NAMENDA) 10 MG tablet Take 1 tablet (10 mg total) by mouth 2 (two) times daily.   metoprolol tartrate (LOPRESSOR) 25 MG tablet Take 1 tablet (25 mg total) by mouth 2 (two) times daily.   Multiple Vitamins-Minerals (CENTRUM PO) Take 1 tablet by mouth daily.   Nutritional Supplements (NUTRITIONAL SHAKE PO) Take 1 Canister by mouth 3 (three) times daily. Magic cup   Probiotic Product (PROBIOTIC PO) Take 1 capsule by mouth daily.   sertraline (ZOLOFT) 25 MG tablet Take 25 mg by mouth daily.   Sodium Phosphates (RA SALINE ENEMA RE) Place 1 Dose rectally as needed.   VITAMIN D PO Take 25 mcg by mouth daily. Take 3 tablets (75 mcg)   No facility-administered encounter medications on file as of 12/30/2020.    Review of Systems  GENERAL: No change in appetite, no fatigue, no weight changes, no fever or chills MOUTH and  THROAT: Denies oral discomfort, gingival pain or bleeding RESPIRATORY: no cough, SOB, DOE, wheezing, hemoptysis CARDIAC: No chest pain, edema or palpitations GI: No abdominal pain, diarrhea, constipation, heart burn, nausea or vomiting GU: Denies dysuria, frequency, hematuria or discharge NEUROLOGICAL: Denies dizziness, syncope, numbness, or headache PSYCHIATRIC: Denies feelings of depression or anxiety. No report of hallucinations, insomnia, paranoia, or agitation   Immunization History   Administered Date(s) Administered   Fluad Quad(high Dose 65+) 11/16/2020   Moderna SARS-COV2 Booster Vaccination 12/17/2019, 06/27/2020   Moderna Sars-Covid-2 Vaccination 03/09/2019, 04/06/2019   Pneumococcal Polysaccharide-23 07/02/2020   Tdap 12/24/2018   Pertinent  Health Maintenance Due  Topic Date Due   DEXA SCAN  Never done   INFLUENZA VACCINE  Completed   Fall Risk 12/07/2020 12/07/2020 12/08/2020 12/08/2020 12/09/2020  Patient Fall Risk Level High fall risk High fall risk High fall risk High fall risk High fall risk     Vitals:   12/30/20 1054  BP: 136/72  Pulse: 62  Resp: 18  Temp: 98.2 F (36.8 C)  Height: 5\' 2"  (1.575 m)   Body mass index is 19.75 kg/m.  Physical Exam  GENERAL APPEARANCE:  In no acute distress.  SKIN:  Skin is warm and dry.  MOUTH and THROAT: Lips are without lesions. Oral mucosa is moist and without lesions.  RESPIRATORY: Breathing is even & unlabored, BS CTAB CARDIAC: RRR, no murmur,no extra heart sounds, no edema GI: Abdomen soft, normal BS, no masses, no tenderness EXTREMITIES:  Able to move X 4 extremities NEUROLOGICAL: There is no tremor. Speech is clear. Alert to self, disoriented to time and place.  PSYCHIATRIC:  Affect and behavior are appropriate  Labs reviewed: Recent Labs    11/29/20 0219 12/01/20 0359 12/06/20 0832 12/18/20 0000  NA 135 135 138 143  K 3.9 3.7 4.1 4.3  CL 103 103 103 105  CO2 17* 22 26 25*  GLUCOSE 184* 166* 128*  --   BUN 7* 13 16 8   CREATININE 0.62 0.50 0.58 0.3*  CALCIUM 9.0 8.8* 9.0 9.0  MG 1.8 1.9  --   --    Recent Labs    11/29/20 0219 12/01/20 0359  AST 25 22  ALT 15 16  ALKPHOS 75 62  BILITOT 1.3* 0.7  PROT 6.7 6.0*  ALBUMIN 3.6 3.2*   Recent Labs    11/28/20 1051 11/28/20 1701 11/29/20 0219 12/01/20 0359 12/06/20 0832 12/18/20 0000  WBC 10.0   < > 7.4 10.4 15.5* 11.6  NEUTROABS 7.5  --  6.2 8.2*  --   --   HGB 11.9*   < > 12.7 12.3 12.2 10.8*  HCT 35.4*   < > 39.1 35.6*  36.9 32*  MCV 91.5   < > 93.3 88.1 92.3  --   PLT 250   < > 269 320 370 318   < > = values in this interval not displayed.   No results found for: TSH Lab Results  Component Value Date   HGBA1C 5.7 (H) 11/29/2020   Lab Results  Component Value Date   CHOL 196 11/29/2020   HDL 48 11/29/2020   LDLCALC 133 (H) 11/29/2020   TRIG 75 11/29/2020   CHOLHDL 4.1 11/29/2020    Significant Diagnostic Results in last 30 days:  DG Swallowing Func-Speech Pathology  Result Date: 12/03/2020 Table formatting from the original result was not included. Objective Swallowing Evaluation: Type of Study: MBS-Modified Barium Swallow Study  Patient Details Name: Ann Johns MRN: 12/05/2020 Date of  Birth: 1933/06/21 Today's Date: 12/03/2020 Time: SLP Start Time (ACUTE ONLY): 1516 -SLP Stop Time (ACUTE ONLY): 1540 SLP Time Calculation (min) (ACUTE ONLY): 24 min Past Medical History: Past Medical History: Diagnosis Date  Cerebrovascular disease 10/03/2020  ICH (intracerebral hemorrhage) ([place]) 11/01/2017  Right frontal  Memory change 10/03/2020  Seizures ([place]) 11/01/2017 Past Surgical History: Past Surgical History: Procedure Laterality Date  VAGINAL HYSTERECTOMY   HPI: 87 yo female who presented on 10/13, brought to hospital by her son from her ALF after weakness and multiple falls. 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 PMH ICH right frontal lobe 2019, seizures, dementia, HTN.  Subjective: Pt was pleasant and cooperative with son at bedside Assessment / Plan / Recommendation CHL IP CLINICAL IMPRESSIONS 12/03/2020 Clinical Impression Pt demonstrated moderate oropharyngeal dysphagia with aspiration present. Oral delays, decreased cohesion and reduced manipulation describe her oral phase. Pt's epiglottis did not fully invert during all swallows and timing of laryngeal closure was late intermittently leading to penetration to vocal cords (PAS 5) and aspiration with straw (PAS 8). Material also penetrated  and aspirated during multiple swallows. Reduced lingual retraction led to vallecular residue and pyriform residue present with mild increase in volume after thicker consistencies. Given pt's cognitive state, compensatory techniques were not attempted. Recommend continue soft texture and upgrade to honey thick liquids, multiple swallows, throat clear intermittently, crush pills and full supervision. SLP Visit Diagnosis Dysphagia, oropharyngeal phase (R13.12) Attention and concentration deficit following -- Frontal lobe and executive function deficit following -- Impact on safety and function Moderate aspiration risk;Severe aspiration risk   CHL IP TREATMENT RECOMMENDATION 12/03/2020 Treatment Recommendations Therapy as outlined in treatment plan below   Prognosis 12/03/2020 Prognosis for Safe Diet Advancement [place] Barriers to Reach Goals Cognitive deficits Barriers/Prognosis Comment -- CHL IP DIET RECOMMENDATION 12/03/2020 SLP Diet Recommendations Honey thick liquids;Other (Comment) Liquid Administration via Cup Medication Administration Crushed with puree Compensations Minimize environmental distractions;Slow rate;Small sips/bites;Multiple dry swallows after each bite/sip;Clear throat intermittently Postural Changes Seated upright at 90 degrees   CHL IP OTHER RECOMMENDATIONS 12/03/2020 Recommended Consults -- Oral Care Recommendations Oral care BID Other Recommendations --   CHL IP FOLLOW UP RECOMMENDATIONS 12/03/2020 Follow up Recommendations Skilled Nursing facility   [facility] IP FREQUENCY AND DURATION 12/03/2020 Speech Therapy Frequency (ACUTE ONLY) min 2x/week Treatment Duration 2 weeks      CHL IP ORAL PHASE 12/03/2020 Oral Phase Impaired Oral - Pudding Teaspoon -- Oral - Pudding Cup -- Oral - Honey Teaspoon -- Oral - Honey Cup Delayed oral transit Oral - Nectar Teaspoon -- Oral - Nectar Cup Delayed oral transit Oral - Nectar Straw -- Oral - Thin Teaspoon -- Oral - Thin Cup Decreased bolus cohesion;Lingual/palatal  residue Oral - Thin Straw Decreased bolus cohesion Oral - Puree Delayed oral transit Oral - Mech Soft Weak lingual manipulation Oral - Regular Impaired mastication Oral - Multi-Consistency -- Oral - Pill -- Oral Phase - Comment --  CHL IP PHARYNGEAL PHASE 12/03/2020 Pharyngeal Phase Impaired Pharyngeal- Pudding Teaspoon -- Pharyngeal -- Pharyngeal- Pudding Cup -- Pharyngeal -- Pharyngeal- Honey Teaspoon -- Pharyngeal -- Pharyngeal- Honey Cup -- Pharyngeal -- Pharyngeal- Nectar Teaspoon -- Pharyngeal -- Pharyngeal- Nectar Cup -- Pharyngeal -- Pharyngeal- Nectar Straw -- Pharyngeal -- Pharyngeal- Thin Teaspoon -- Pharyngeal -- Pharyngeal- Thin Cup Reduced epiglottic inversion;Pharyngeal residue - valleculae;Pharyngeal residue - pyriform;Penetration/Aspiration during swallow;Reduced airway/laryngeal closure Pharyngeal Material enters airway, remains ABOVE vocal cords then ejected out;Material enters airway, remains ABOVE vocal cords and not ejected out  Pharyngeal- Thin Straw Penetration/Aspiration during swallow;Pharyngeal residue - valleculae;Pharyngeal residue - pyriform Pharyngeal Material enters airway, CONTACTS cords and not ejected out;Material enters airway, passes BELOW cords without attempt by patient to eject out (silent aspiration) Pharyngeal- Puree Pharyngeal residue - valleculae Pharyngeal -- Pharyngeal- Mechanical Soft Pharyngeal residue - valleculae Pharyngeal -- Pharyngeal- Regular Pharyngeal residue - valleculae Pharyngeal -- Pharyngeal- Multi-consistency -- Pharyngeal -- Pharyngeal- Pill -- Pharyngeal -- Pharyngeal Comment --  CHL IP CERVICAL ESOPHAGEAL PHASE 12/03/2020 Cervical Esophageal Phase WFL Pudding Teaspoon -- Pudding Cup -- Honey Teaspoon -- Honey Cup -- Nectar Teaspoon -- Nectar Cup -- Nectar Straw -- Thin Teaspoon -- Thin Cup -- Thin Straw -- Puree -- Mechanical Soft -- Regular -- Multi-consistency -- Pill -- Cervical Esophageal Comment -- [place] Siren 12/03/2020, 4:57 PM                Assessment/Plan  1. Essential hypertension -   BPs stable, continue metoprolol tartrate 25 mg twice a day  2. History of CVA (cerebrovascular accident) -    Stable, continue aspirin 81 mg daily and atorvastatin 40 mg 1 tab daily -   Continue PT and OT, for therapeutic strengthening exercises  3. Oropharyngeal dysphagia -   Currently on regular diet -    Currently having speech therapy -    Aspiration precautions  4. Major depression, recurrent, chronic (HCC) -   PHQ-9 0, no depressed mood -   Continue sertraline 25 mg daily  5. Dementia without behavioral disturbance ([place]) -   BIMS score 6/15, ranging in severe cognitive impairment -    Continue Namenda 10 mg twice a day   Family/ staff Communication: Discussed plan of care with resident and charge nurse.  Labs/tests ordered: CBC with differentials  Goals of care:   Short-term care   Durenda Age, DNP, MSN, FNP-BC [facility] and Adult Medicine [phone] (Monday-Friday 8:00 a.m. - 5:00 p.m.) [phone] (after hours)

## 2021-01-07 ENCOUNTER — Encounter: Payer: Self-pay | Admitting: Adult Health

## 2021-01-07 ENCOUNTER — Non-Acute Institutional Stay (SKILLED_NURSING_FACILITY): Payer: Medicare Other | Admitting: Adult Health

## 2021-01-07 DIAGNOSIS — U071 COVID-19: Secondary | ICD-10-CM | POA: Diagnosis not present

## 2021-01-07 DIAGNOSIS — F0390 Unspecified dementia without behavioral disturbance: Secondary | ICD-10-CM

## 2021-01-07 DIAGNOSIS — I10 Essential (primary) hypertension: Secondary | ICD-10-CM | POA: Diagnosis not present

## 2021-01-07 DIAGNOSIS — Z8673 Personal history of transient ischemic attack (TIA), and cerebral infarction without residual deficits: Secondary | ICD-10-CM | POA: Diagnosis not present

## 2021-01-07 DIAGNOSIS — F339 Major depressive disorder, recurrent, unspecified: Secondary | ICD-10-CM

## 2021-01-07 DIAGNOSIS — N390 Urinary tract infection, site not specified: Secondary | ICD-10-CM

## 2021-01-07 NOTE — Progress Notes (Signed)
Location:  [place] Room Number: [id number] Place of Service:  SNF (31) Provider:  Durenda Age, DNP, FNP-BC  Patient Care Team: [name], MD as PCP - General (Family Medicine) Reymundo Poll, MD as Referring Physician [facility] Medicine)  Extended Emergency Contact Information Primary Emergency Contact: ALANNAH, CIESLINSKI Mobile Phone: [phone] Relation: Son Secondary Emergency Contact: NEKESHA, MESTER Mobile Phone: [phone] Relation: Daughter  Code Status:    Goals of care: Advanced Directive information Advanced Directives 01/07/2021  Does Patient Have a Medical Advance Directive? Yes  Type of Paramedic of [place];Living will  Does patient want to make changes to medical advance directive? No - Patient declined  Copy of [place] in Chart? Yes - validated most recent copy scanned in chart (See row information)  Would patient like information on creating a medical advance directive? -     Chief Complaint  Patient presents with   Discharge Note    Discharge from [facility]     HPI:  Pt is a 87 y.o. female who is for discharge to [place] on 01/08/21 with Home health PT and OT.  She was admitted to [facility] on  12/09/20 post hospital admission 11/28/20 to 12/09/20. She has a PMH of history intracerebral hemorrhage in 2019, seizures, early dementia and essential hypertension. She fell twice in her ALF and was brought to ED by son. MRI without contrast on 11/28/20 showed moderate size acute/early subacute left PCA territory infarct affecting the cortical/subcortical left parietal occipital lobes and callosal splenium. She was seen by neurology and recommended dual antiplatelet therapy with aspirin and Plavix for 3 weeks then aspirin alone. Statin was continued. SLP recommended dysphagia 3 diet with honey thick liquid. She was  treated for UTI with Rocephin, cultures were negative. She tested positive for COVID-19 infection. She had 2 doses of COVID-19 vaccination and was treated with short course of Decadron IV and Remdesevir. She completed her isolation protocol in the hospital.    Patient was admitted to this facility for short-term rehabilitation after the patient's recent hospitalization.  Patient has completed SNF rehabilitation and therapy has cleared the patient for discharge.   Past Medical History:  Diagnosis Date   Cerebrovascular disease 10/03/2020   ICH (intracerebral hemorrhage) ([place]) 11/01/2017   Right frontal   Memory change 10/03/2020   Seizures ([place]) 11/01/2017   Past Surgical History:  Procedure Laterality Date   VAGINAL HYSTERECTOMY      Allergies  Allergen Reactions   Sulfa Antibiotics Rash    Outpatient Encounter Medications as of 01/07/2021  Medication Sig   acetaminophen (TYLENOL) 325 MG tablet Take 2 tablets (650 mg total) by mouth every 6 (six) hours as needed for mild pain, fever or headache (or Fever >/= 101).   aspirin 81 MG chewable tablet Chew 1 tablet (81 mg total) by mouth daily.   atorvastatin (LIPITOR) 40 MG tablet Take 1 tablet (40 mg total) by mouth daily.   bisacodyl (DULCOLAX) 5 MG EC tablet Take 1 tablet (5 mg total) by mouth daily as needed for moderate constipation.   cholecalciferol (VITAMIN D) 25 MCG (1000 UNIT) tablet Take 3,000 Units by mouth daily.   Magnesium Hydroxide (MILK OF MAGNESIA PO) Take 30 mLs by mouth as needed.   memantine (NAMENDA) 10 MG tablet Take 1 tablet (10 mg total) by mouth 2 (two) times daily.   metoprolol tartrate (LOPRESSOR) 25 MG tablet Take 1 tablet (25 mg  total) by mouth 2 (two) times daily.   Multiple Vitamins-Minerals (CENTRUM PO) Take 1 tablet by mouth daily.   Nutritional Supplements (NUTRITIONAL SHAKE PO) Take 1 Canister by mouth 3 (three) times daily. Magic cup   Probiotic Product (PROBIOTIC PO) Take 1 capsule by mouth daily.    sertraline (ZOLOFT) 25 MG tablet Take 25 mg by mouth daily.   Sodium Phosphates (RA SALINE ENEMA RE) Place 1 Dose rectally as needed.   [DISCONTINUED] VITAMIN D PO Take 25 mcg by mouth daily. Take 3 tablets (75 mcg)   No facility-administered encounter medications on file as of 01/07/2021.    Review of Systems  GENERAL: No change in appetite, no fatigue, no weight changes, no fever or chills  MOUTH and THROAT: Denies oral discomfort, gingival pain or bleeding RESPIRATORY: no cough, SOB, DOE, wheezing, hemoptysis CARDIAC: No chest pain, edema or palpitations GI: No abdominal pain, diarrhea, constipation, heart burn, nausea or vomiting GU: Denies dysuria, frequency, hematuria or discharge NEUROLOGICAL: Denies dizziness, syncope, numbness, or headache PSYCHIATRIC: Denies feelings of depression or anxiety. No report of hallucinations, insomnia, paranoia, or agitation   Immunization History  Administered Date(s) Administered   Fluad Quad(high Dose 65+) 11/16/2020   Moderna SARS-COV2 Booster Vaccination 12/17/2019, 06/27/2020   Moderna Sars-Covid-2 Vaccination 03/09/2019, 04/06/2019   Pneumococcal Polysaccharide-23 07/02/2020   Tdap 12/24/2018   Pertinent  Health Maintenance Due  Topic Date Due   DEXA SCAN  Never done   INFLUENZA VACCINE  Completed   Fall Risk 12/07/2020 12/07/2020 12/08/2020 12/08/2020 12/09/2020  Patient Fall Risk Level High fall risk High fall risk High fall risk High fall risk High fall risk     Vitals:   01/07/21 0959  BP: 123/66  Pulse: 71  Resp: 18  Temp: (!) 97.3 F (36.3 C)  Weight: 107 lb (48.5 kg)  Height: 5\' 2"  (1.575 m)   Body mass index is 19.57 kg/m.  Physical Exam  GENERAL APPEARANCE:  In no acute distress.  SKIN:  Skin is warm and dry.  MOUTH and THROAT: Lips are without lesions. Oral mucosa is moist and without lesions.  RESPIRATORY: Breathing is even & unlabored, BS CTAB CARDIAC: RRR, no murmur,no extra heart sounds, no edema GI:  Abdomen soft, normal BS, no masses, no tenderness NEUROLOGICAL: There is no tremor. Speech is clear. Alert to self, disoriented to time and place. PSYCHIATRIC:  Affect and behavior are appropriate  Labs reviewed: Recent Labs    11/29/20 0219 12/01/20 0359 12/06/20 0832 12/18/20 0000  NA 135 135 138 143  K 3.9 3.7 4.1 4.3  CL 103 103 103 105  CO2 17* 22 26 25*  GLUCOSE 184* 166* 128*  --   BUN 7* 13 16 8   CREATININE 0.62 0.50 0.58 0.3*  CALCIUM 9.0 8.8* 9.0 9.0  MG 1.8 1.9  --   --    Recent Labs    11/29/20 0219 12/01/20 0359  AST 25 22  ALT 15 16  ALKPHOS 75 62  BILITOT 1.3* 0.7  PROT 6.7 6.0*  ALBUMIN 3.6 3.2*   Recent Labs    11/28/20 1051 11/28/20 1701 11/29/20 0219 12/01/20 0359 12/06/20 0832 12/18/20 0000  WBC 10.0   < > 7.4 10.4 15.5* 11.6  NEUTROABS 7.5  --  6.2 8.2*  --   --   HGB 11.9*   < > 12.7 12.3 12.2 10.8*  HCT 35.4*   < > 39.1 35.6* 36.9 32*  MCV 91.5   < > 93.3 88.1 92.3  --  PLT 250   < > 269 320 370 318   < > = values in this interval not displayed.   No results found for: TSH Lab Results  Component Value Date   HGBA1C 5.7 (H) 11/29/2020   Lab Results  Component Value Date   CHOL 196 11/29/2020   HDL 48 11/29/2020   LDLCALC 133 (H) 11/29/2020   TRIG 75 11/29/2020   CHOLHDL 4.1 11/29/2020    Significant Diagnostic Results in last 30 days:  No results found.  Assessment/Plan  1. History of CVA (cerebrovascular accident) -   MRI without contrast on 11/28/20 showed moderate size acute/early subacute left PCA territory infarct affecting the cortical/subcortical left parietal occipital lobes and callosal splenium.  -   follow up with neurology - atorvastatin (LIPITOR) 40 MG tablet; Take 1 tablet (40 mg total) by mouth daily.  Dispense: 30 tablet; Refill: 0  2. SARS-CoV-2 positive -  had 2 doses of COVID-19 vaccination and was treated with short course of Decadron IV and Remdesevir. She completed her isolation protocol in the  hospital.  3. Urinary tract infection without hematuria, site unspecified -  was treated with Rocephin X 3, negative cultures  4. Major depression, recurrent, chronic (HCC) - sertraline (ZOLOFT) 25 MG tablet; Take 1 tablet (25 mg total) by mouth daily.  Dispense: 30 tablet; Refill: 0  5. Essential hypertension - metoprolol tartrate (LOPRESSOR) 25 MG tablet; Take 1 tablet (25 mg total) by mouth 2 (two) times daily.  Dispense: 60 tablet; Refill: 0  6. Dementia without behavioral disturbance (HCC) - memantine (NAMENDA) 10 MG tablet; Take 1 tablet (10 mg total) by mouth 2 (two) times daily.  Dispense: 60 tablet; Refill: 0     I have filled out patient's discharge paperwork and e-prescribed medications.  Patient will have home health PT and OT.  DME provided:  wheelchair and walker  Wheelchair -  Patient had history of CVA which impairs her ability to perform daily activities like toileting, feeding, dressing, grooming and bathing in the home. A cane or walker will not resolve issue with performing activities of daily living. A wheelchair will allow patient to safely perform daily activities. Patient has a caregiver who can provide assistance.   Total discharge time: Greater than 30 minutes Greater than 50% was spent in counseling and coordination of care.    Discharge time involved coordination of the discharge process with social worker, nursing staff and therapy department. Medical justification for home health services/DME verified.   Durenda Age, DNP, MSN, FNP-BC [facility] and Adult Medicine [phone] (Monday-Friday 8:00 a.m. - 5:00 p.m.) [phone] (after hours)

## 2021-01-08 MED ORDER — METOPROLOL TARTRATE 25 MG PO TABS: 25.0000 mg | ORAL_TABLET | Freq: Two times a day (BID) | ORAL | 0 refills | Status: DC

## 2021-01-08 MED ORDER — ATORVASTATIN CALCIUM 40 MG PO TABS: 40.0000 mg | ORAL_TABLET | Freq: Every day | ORAL | 0 refills | Status: AC

## 2021-01-08 MED ORDER — SERTRALINE HCL 25 MG PO TABS
25.0000 mg | ORAL_TABLET | Freq: Every day | ORAL | 0 refills | Status: AC
Start: 2021-01-08 — End: ?

## 2021-01-08 MED ORDER — MEMANTINE HCL 10 MG PO TABS: 10.0000 mg | ORAL_TABLET | Freq: Two times a day (BID) | ORAL | 0 refills | Status: DC

## 2021-01-27 NOTE — Progress Notes (Signed)
[facility] [address] [place]. [place] [address] [phone]       HOSPITAL FOLLOW UP NOTE  [name] Date of Birth:  October 23, 1933 Medical Record Number:  [id number]   Reason for Referral:  hospital stroke follow up    SUBJECTIVE:   CHIEF COMPLAINT:  Chief Complaint  Patient presents with   Follow-up    Rm 2 with son and daughter-in-law here for hospital follow up. Finished up rehab at [facility] and is now at [facility] independent living. Working with physcial/occupational, and speech therapy at [facility].     HPI:   Ann Johns is a 87 y.o. with PMHx of right frontal ICH in 2019, seizure associated with ICH and early stage of dementia who presented on 11/28/2020 with coughing and 2x falls at [facility]. Personally reviewed hospitalization pertinent progress notes, lab work and imaging. Found to be COVID positive. MR brain showed moderate acute left PCA infarct and bifrontal chronic hemorrhage R>L.  MRA head mild right P2 stenosis.  Carotid Doppler unremarkable.  EF 70 to 75%.  EEG no seizure.  A1c 5.7.  LDL 133.  Etiology concerning for embolic pattern however given history of ICH, dementia, advanced age and falls, patient not a good candidate for [facility]  Recommended DAPT for 3 weeks and aspirin alone as well as initiated atorvastatin 40 mg daily.  Residual deficits of right hemianopia, and BUE and BLE weakness. PT/OT evals recommended SNF   Today, 01/28/2021, being seen for hospital follow up accompanied by her son and daughter-in-law.  Overall doing well since discharge -denies new stroke/TIA symptoms She returned back to [place] currently working with therapies with gradual improvement Current use of RW -denies any recent falls.  Previously using Rollator walker. Denies any specific area of weakness.  Does not notice any residual visual impairment Cognition stable since discharge - denies any worsening on Namenda (followed by Dr. Henderson [place], NP) Does have CNA assist patient with medications and intermittent visits  Completed 3 weeks DAPT -remains on aspirin and atorvastatin without side effects Blood pressure today 106/56 - believes occasionally monitored at ILF but unsure how frequent They have since had f/u with Dr. Ernie Hew but is looking into switching providers at the beginning of the year due to insurance reasons - looking at establishing care with provider at [facility]  No further concerns at this time     PERTINENT IMAGING/LABS  Code Stroke CT head: 1. Low-attenuation changes in the left frontal lobe is new from prior and likely represents a late subacute to chronic left ACA territory infarction. Further evaluation with MRI of the brain is recommended. 2. No intracranial hemorrhage identified. 3. Chronic right anterior frontal lobe infarct is unchanged. 4. Right periorbital soft tissue swelling. 5. Paranasal sinus disease as described.   MRI   Moderate-sized acute/early subacute left PCA territory infarct affecting the cortical/subcortical left parietooccipital lobes and callosal splenium. Redemonstrated chronic encephalomalacia and associated chronic blood products in the anterior frontal lobes, and anterior right temporal lobe, presumably posttraumatic in etiology. Chronic small-vessel ischemic changes which are severe in the cerebral white matter, and mild in the pons. Mild-to-moderate generalized cerebral atrophy. Comparatively mild cerebellar atrophy. Paranasal sinus disease, as described.   MRA   No emergent large vessel occlusion or high-grade stenosis. Mild narrowing of the right PCA P2 segment. Carotid Doppler  Right Carotid: Velocities in the right ICA are consistent with a 1-39%  stenosis. The extracranial vessels were near-normal with only minimal wall thickening or  plaque.  Left Carotid: Velocities in the left ICA are consistent with a 1-39%  stenosis. The extracranial vessels were near-normal  with only minimal wall thickening or plaque.  Vertebrals:  Bilateral vertebral arteries demonstrate antegrade flow.  Subclavians: Normal flow hemodynamics were seen in bilateral subclavian arteries.  2D Echo  . Left ventricular ejection fraction, by estimation, is 70 to 75%. The  left ventricle has hyperdynamic function. The left ventricle has no  regional wall motion abnormalities. There is moderate asymmetric left ventricular hypertrophy of the basal-septal   segment. Indeterminate diastolic filling due to E-A fusion.   2. Right ventricular systolic function is normal. The right ventricular  size is normal.   3. The mitral valve is grossly normal. Mild mitral valve regurgitation.  No evidence of mitral stenosis.   4. The aortic valve was not well visualized. There is mild calcification  of the aortic valve. Aortic valve regurgitation is mild to moderate. No  aortic stenosis is present.  The interatrial septum was not well visualized.  EEG This study is suggestive of mild diffuse encephalopathy, nonspecific etiology. No seizures or epileptiform discharges were seen throughout the recording    A1C 5.7  Lipid Panel     Component Value Date/Time   CHOL 196 11/29/2020 0219   TRIG 75 11/29/2020 0219   HDL 48 11/29/2020 0219   CHOLHDL 4.1 11/29/2020 0219   VLDL 15 11/29/2020 0219   LDLCALC 133 (H) 11/29/2020 0219         ROS:   N/A d/t cognitive impairment  PMH:  Past Medical History:  Diagnosis Date   Cerebrovascular disease 10/03/2020   ICH (intracerebral hemorrhage) ([place]) 11/01/2017   Right frontal   Memory change 10/03/2020   Seizures ([place]) 11/01/2017    PSH:  Past Surgical History:  Procedure Laterality Date   VAGINAL HYSTERECTOMY      Social History:  Social History   Socioeconomic History   Marital status: Widowed    Spouse name: Not on file   Number of children: Not on file   Years of education: 14   Highest education level: Not on file  Occupational  History   Not on file  Tobacco Use   Smoking status: Former   Smokeless tobacco: Never  Vaping Use   Vaping Use: Never used  Substance and Sexual Activity   Alcohol use: Not Currently   Drug use: Never   Sexual activity: Not on file  Other Topics Concern   Not on file  Social History Narrative   Lives   Right handed   Caffeine use:    Social Determinants of Health   Financial Resource Strain: Not on file  Food Insecurity: Not on file  Transportation Needs: Not on file  Physical Activity: Not on file  Stress: Not on file  Social Connections: Not on file  Intimate Partner Violence: Not on file    Family History: No family history on file.  Medications:   Current Outpatient Medications on File Prior to Visit  Medication Sig Dispense Refill   aspirin 81 MG chewable tablet Chew 1 tablet (81 mg total) by mouth daily.     atorvastatin (LIPITOR) 40 MG tablet Take 1 tablet (40 mg total) by mouth daily. 30 tablet 0   bisacodyl (DULCOLAX) 5 MG EC tablet Take 1 tablet (5 mg total) by mouth daily as needed for moderate constipation. 30 tablet 0   cholecalciferol (VITAMIN D) 25 MCG (1000 UNIT) tablet Take 3,000 Units by mouth daily.  Magnesium Hydroxide (MILK OF MAGNESIA PO) Take 30 mLs by mouth as needed.     memantine (NAMENDA) 10 MG tablet Take 1 tablet (10 mg total) by mouth 2 (two) times daily. 60 tablet 0   Multiple Vitamins-Minerals (CENTRUM PO) Take 1 tablet by mouth daily.     Nutritional Supplements (NUTRITIONAL SHAKE PO) Take 1 Canister by mouth 3 (three) times daily. Boost/ensure     Probiotic Product (PROBIOTIC PO) Take 1 capsule by mouth daily.     propranolol (INDERAL) 20 MG tablet Take 20 mg by mouth 3 (three) times daily.     sertraline (ZOLOFT) 25 MG tablet Take 1 tablet (25 mg total) by mouth daily. 30 tablet 0   Sodium Phosphates (RA SALINE ENEMA RE) Place 1 Dose rectally as needed.     No current facility-administered medications on file prior to visit.     Allergies:   Allergies  Allergen Reactions   Sulfa Antibiotics Rash      OBJECTIVE:  Physical Exam  Vitals:   01/28/21 0905  BP: (!) 106/56  Pulse: 62  SpO2: 95%  Weight: 103 lb (46.7 kg)   Body mass index is 18.84 kg/m. No results found.  General: Frail pleasant elderly Caucasian female, seated, in no evident distress Head: head normocephalic and atraumatic.   Neck: supple with no carotid or supraclavicular bruits Cardiovascular: regular rate and rhythm, no murmurs Musculoskeletal: no deformity Skin:  no rash/petichiae Vascular:  Normal pulses all extremities   Neurologic Exam Mental Status: Awake and fully alert.  Fluent speech and language. Disoriented to month and current location. Able to state year and where she is currently living. Recent memory impaired and remote memory intact. Attention span, concentration and fund of knowledge diminished. Mood and affect appropriate.  Cranial Nerves: Fundoscopic exam reveals sharp disc margins. Pupils equal, briskly reactive to light. Extraocular movements full without nystagmus. Visual fields right homonymous hemianopia (?upper>lower). Hearing intact. Facial sensation intact. Face, tongue, palate moves normally and symmetrically.  Motor: Normal bulk and tone. Normal strength in all tested extremity muscles Sensory.: intact to touch , pinprick , position and vibratory sensation.  Coordination: Rapid alternating movements normal in all extremities. Finger-to-nose and heel-to-shin performed accurately bilaterally. Gait and Station: Arises from chair without difficulty. Stance is normal. Gait demonstrates normal stride length and mild unsteadiness with use of rolling walker. Tandem walk and heel toe not attempted.  Reflexes: 1+ and symmetric. Toes downgoing.     NIHSS  3 (vision, orientation)  Modified Rankin  3-4      ASSESSMENT: Ann Johns is a 87 y.o. year old female with moderate size acute/early subacute left PCA  territory infarct on [id number] embolic possibly in setting of COVID infection and intracranial atherosclerosis. Vascular risk factors include HLD, history of [place] 2019 with associated seizures, dementia and advanced age.      PLAN:  L PCA stroke :  Residual deficit: right hemianopia and gait impairment.  Continue working with therapies.  Use of rolling walker at all times for fall prevention. Per family request, referral placed to ophthalmology for vision evaluation Further cardiac work-up not recommended as not a good AC candidate due to advanced age, dementia, fall risk and hx of ICH.   Continue aspirin 81 mg daily  and atorvastatin 40 mg daily for secondary stroke prevention.  Discussed secondary stroke prevention measures and importance of close PCP follow up for aggressive stroke risk factor management. I have gone over the pathophysiology of stroke, warning signs and symptoms,  risk factors and their management in some detail with instructions to go to the closest emergency room for symptoms of concern. HLD: LDL goal <70. Recent LDL 133.  Continue atorvastatin 40 mg daily - check cholesterol levels today Dementia, hx of seizures: followed by Dr. Lewanda Rife, NP - scheduled f/u visit 04/23/2021    Follow up in 4 months or call earlier if needed   CC:  [facility] provider: Dr. Pearlean Brownie PCP: Lewis Moccasin, MD    I spent 58 minutes of face-to-face and non-face-to-face time with patient and family.  This included previsit chart review including review of recent hospitalization, lab review, study review, order entry, electronic health record documentation, patient and family education regarding recent stroke including likely etiology, secondary stroke prevention measures and importance of managing stroke risk factors, residual deficits and typical recovery time and answered all other questions to patient and family's satisfaction  Ihor Austin, AGNP-BC  [facility] [address] [place], [place] [address]  Phone [phone] Fax [phone] Note: This document was prepared with digital dictation and possible smart phrase technology. Any transcriptional errors that result from this process are unintentional.

## 2021-01-28 ENCOUNTER — Other Ambulatory Visit: Payer: Self-pay

## 2021-01-28 ENCOUNTER — Ambulatory Visit (INDEPENDENT_AMBULATORY_CARE_PROVIDER_SITE_OTHER): Payer: Medicare Other | Admitting: Adult Health

## 2021-01-28 ENCOUNTER — Encounter: Payer: Self-pay | Admitting: Adult Health

## 2021-01-28 ENCOUNTER — Telehealth: Payer: Self-pay | Admitting: Adult Health

## 2021-01-28 VITALS — BP 106/56 | HR 62 | Wt 103.0 lb

## 2021-01-28 DIAGNOSIS — I63432 Cerebral infarction due to embolism of left posterior cerebral artery: Secondary | ICD-10-CM

## 2021-01-28 DIAGNOSIS — H53461 Homonymous bilateral field defects, right side: Secondary | ICD-10-CM

## 2021-01-28 DIAGNOSIS — E785 Hyperlipidemia, unspecified: Secondary | ICD-10-CM | POA: Diagnosis not present

## 2021-01-28 NOTE — Patient Instructions (Addendum)
I will send a referral to [facility] for further evaluation   Continue aspirin 81 mg daily  and atorvastatin 40mg  daily  for secondary stroke prevention  Continue to follow up with PCP regarding cholesterol and blood pressure management  Maintain strict control of hypertension with blood pressure goal below 130/90 and cholesterol with LDL cholesterol (bad cholesterol) goal below 70 mg/dL.   Signs of a Stroke? Follow the BEFAST method:  Balance Watch for a sudden loss of balance, trouble with coordination or vertigo Eyes Is there a sudden loss of vision in one or both eyes? Or double vision?  Face: Ask the person to smile. Does one side of the face droop or is it numb?  Arms: Ask the person to raise both arms. Does one arm drift downward? Is there weakness or numbness of a leg? Speech: Ask the person to repeat a simple phrase. Does the speech sound slurred/strange? Is the person confused ? Time: If you observe any of these signs, call 911.     Followup in the future with me in 4 months or call earlier if needed       Thank you for coming to see at [facility]. I hope we have been able to provide you high quality care today.  You may receive a patient satisfaction survey over the next few weeks. We would appreciate your feedback and comments so that we may continue to improve ourselves and the health of our patients.   Stroke Prevention Some medical conditions and lifestyle choices can lead to a higher risk for a stroke. You can help to prevent a stroke by eating healthy foods and exercising. It also helps to not smoke and to manage any health problems you may have. How can this condition affect me? A stroke is an emergency. It should be treated right away. A stroke can lead to brain damage or threaten your life. There is a better chance of surviving and getting better after a stroke if you get medical help right away. What can increase my risk? The  following medical conditions may increase your risk of a stroke: Diseases of the heart and blood vessels (cardiovascular disease). High blood pressure (hypertension). Diabetes. High cholesterol. Sickle cell disease. Problems with blood clotting. Being very overweight. Sleeping problems (obstructivesleep apnea). Other risk factors include: Being older than age 75. A history of blood clots, stroke, or mini-stroke (TIA). Race, ethnic background, or a family history of stroke. Smoking or using tobacco products. Taking birth control pills, especially if you smoke. Heavy alcohol and drug use. Not being active. What actions can I take to prevent this? Manage your health conditions High cholesterol. Eat a healthy diet. If this is not enough to manage your cholesterol, you may need to take medicines. Take medicines as told by your doctor. High blood pressure. Try to keep your blood pressure below 130/80. If your blood pressure cannot be managed through a healthy diet and regular exercise, you may need to take medicines. Take medicines as told by your doctor. Ask your doctor if you should check your blood pressure at home. Have your blood pressure checked every year. Diabetes. Eat a healthy diet and get regular exercise. If your blood sugar (glucose) cannot be managed through diet and exercise, you may need to take medicines. Take medicines as told by your doctor. Talk to your doctor about getting checked for sleeping problems. Signs of a problem can include: Snoring a lot. Feeling very tired. Make  sure that you manage any other conditions you have. Nutrition  Follow instructions from your doctor about what to eat or drink. You may be told to: Eat and drink fewer calories each day. Limit how much salt (sodium) you use to 1,500 milligrams (mg) each day. Use only healthy fats for cooking, such as olive oil, canola oil, and sunflower oil. Eat healthy foods. To do this: Choose foods that  are high in fiber. These include whole grains, and fresh fruits and vegetables. Eat at least 5 servings of fruits and vegetables a day. Try to fill one-half of your plate with fruits and vegetables at each meal. Choose low-fat (lean) proteins. These include low-fat cuts of meat, chicken without skin, fish, tofu, beans, and nuts. Eat low-fat dairy products. Avoid foods that: Are high in salt. Have saturated fat. Have trans fat. Have cholesterol. Are processed or pre-made. Count how many carbohydrates you eat and drink each day. Lifestyle If you drink alcohol: Limit how much you have to: 0-1 drink a day for women who are not pregnant. 0-2 drinks a day for men. Know how much alcohol is in your drink. In the [place] one drink equals one 12 oz bottle of beer ( ), one 5 oz glass of wine ( ), or one 1 oz glass of hard liquor (62mL). Do not smoke or use any products that have nicotine or tobacco. If you need help quitting, ask your doctor. Avoid secondhand smoke. Do not use drugs. Activity  Try to stay at a healthy weight. Get at least 30 minutes of exercise on most days, such as: Fast walking. Biking. Swimming. Medicines Take over-the-counter and prescription medicines only as told by your doctor. Avoid taking birth control pills. Talk to your doctor about the risks of taking birth control pills if: You are over 32 years old. You smoke. You get very bad headaches. You have had a blood clot. Where to find more information American Stroke Association: www.strokeassociation.org Get help right away if: You or a loved one has any signs of a stroke. "BE FAST" is an easy way to remember the warning signs: B - Balance. Dizziness, sudden trouble walking, or loss of balance. E - Eyes. Trouble seeing or a change in how you see. F - Face. Sudden weakness or loss of feeling of the face. The face or eyelid may droop on one side. A - Arms. Weakness or loss of feeling in an arm. This happens  all of a sudden and most often on one side of the body. S - Speech. Sudden trouble speaking, slurred speech, or trouble understanding what people say. T - Time. Time to call emergency services. Write down what time symptoms started. You or a loved one has other signs of a stroke, such as: A sudden, very bad headache with no known cause. Feeling like you may vomit (nausea). Vomiting. A seizure. These symptoms may be an emergency. Get help right away. Call your local emergency services (911 in the [place] Do not wait to see if the symptoms will go away. Do not drive yourself to the hospital. Summary You can help to prevent a stroke by eating healthy, exercising, and not smoking. It also helps to manage any health problems you have. Do not smoke or use any products that contain nicotine or tobacco. Get help right away if you or a loved one has any signs of a stroke. This information is not intended to replace advice given to you by your health care provider. Make sure  you discuss any questions you have with your health care provider. Document Revised: 09/04/2019 Document Reviewed: 09/04/2019 Elsevier Patient Education  [place].

## 2021-01-28 NOTE — Telephone Encounter (Signed)
Pt scheduled for f/u with Sarah on 3/8.

## 2021-01-29 ENCOUNTER — Telehealth: Payer: Self-pay | Admitting: Adult Health

## 2021-01-29 LAB — LIPID PANEL
Chol/HDL Ratio: 2.7 ratio (ref 0.0–4.4)
Cholesterol, Total: 121 mg/dL (ref 100–199)
HDL: 45 mg/dL (ref >39)
LDL Chol Calc (NIH): 53 mg/dL (ref 0–99)
Triglycerides: 133 mg/dL (ref 0–149)
VLDL Cholesterol Cal: 23 mg/dL (ref 5–40)

## 2021-01-29 NOTE — Progress Notes (Signed)
I agree with the above plan 

## 2021-01-29 NOTE — Telephone Encounter (Signed)
Ophthalmology referral has been sent to [facility]. Phone: [phone]. °

## 2021-04-17 ENCOUNTER — Ambulatory Visit: Payer: Medicare Other | Admitting: Neurology

## 2021-04-23 ENCOUNTER — Ambulatory Visit: Payer: Medicare Other | Admitting: Neurology

## 2021-04-23 ENCOUNTER — Encounter: Payer: Self-pay | Admitting: Neurology

## 2021-04-23 VITALS — BP 155/70 | HR 64 | Ht 62.0 in | Wt 109.0 lb

## 2021-04-23 DIAGNOSIS — F03B0 Unspecified dementia, moderate, without behavioral disturbance, psychotic disturbance, mood disturbance, and anxiety: Secondary | ICD-10-CM

## 2021-04-23 DIAGNOSIS — I63432 Cerebral infarction due to embolism of left posterior cerebral artery: Secondary | ICD-10-CM

## 2021-04-23 DIAGNOSIS — F0390 Unspecified dementia without behavioral disturbance: Secondary | ICD-10-CM | POA: Diagnosis not present

## 2021-04-23 DIAGNOSIS — H53461 Homonymous bilateral field defects, right side: Secondary | ICD-10-CM

## 2021-04-23 DIAGNOSIS — R569 Unspecified convulsions: Secondary | ICD-10-CM

## 2021-04-23 MED ORDER — MEMANTINE HCL 10 MG PO TABS: 10.0000 mg | ORAL_TABLET | Freq: Two times a day (BID) | ORAL | 11 refills | Status: AC

## 2021-04-23 NOTE — Progress Notes (Signed)
? ? ?PATIENT: Ann LimaNorene Johns ?DOB: May 07, 1933 ? ?REASON FOR VISIT: Follow up for Dementia, stroke ?HISTORY FROM: Patient, son, daughter in law  ?PRIMARY NEUROLOGIST: Dr. Marjory LiesPenumalli  ? ?HISTORY OF PRESENT ILLNESS: ?Today 04/23/21 ?Ann Johns here today for follow-up.  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  Residual deficits of right hemianopia and gait impairment.  No seizures, has been off Keppra since August 2022. ? ?Here with son, daughter in law, living in memory care at [facility], recently 3 weeks ago. Adjusting well. Namenda is expensive. Mood is good, is easy going. No falls. Doing overall well. Using walker.  ? ?HISTORY ?10/03/2020 Dr. Anne HahnWillis: Ann Johns is an 88 year old right-handed white female with a history of a spontaneous right frontal hemorrhage that occurred in June 2019.  MRI of the brain has shown extensive white matter disease as well.  There was some question whether she had cerebral amyloid angiopathy, but a review of a recent MRI of the brain done in March 2022 does not show microhemorrhages that would suggest this diagnosis.  The patient currently is at [facility] in an independent living apartment, but the family comes with her today indicating that she is not functioning well independently.  She is requiring increasing levels of supervision, she cannot take her medications without being administered to her, she has difficulty with keeping track of time.  For this reason, she is missing meals and missing medications.  She walks with a walker, she has not had any falls.  She is not taking showers on a regular basis, her personal hygiene is dropping off.  She has not had any further seizures.  She is on low-dose Namenda taking 5 mg twice daily, she is not on Aricept.  She remains on Keppra.  The seizures that occurred previously occurred in the  hyperacute timeframe just following the hemorrhage. ?   ?REVIEW OF SYSTEMS: Out of a complete 14 system review of symptoms, the patient complains only of the following symptoms, and all other reviewed systems are negative. ? ?See HPI ? ?ALLERGIES: ?Allergies  ?Allergen Reactions  ? Sulfa Antibiotics Rash  ? ? ?HOME MEDICATIONS: ?Outpatient Medications Prior to Visit  ?Medication Sig Dispense Refill  ? aspirin 81 MG chewable tablet Chew 1 tablet (81 mg total) by mouth daily.    ? atorvastatin (LIPITOR) 40 MG tablet Take 1 tablet (40 mg total) by mouth daily. 30 tablet 0  ? memantine (NAMENDA) 10 MG tablet Take 1 tablet (10 mg total) by mouth 2 (two) times daily. 60 tablet 0  ? Multiple Vitamins-Minerals (CENTRUM PO) Take 1 tablet by mouth daily.    ? Nutritional Supplements (NUTRITIONAL SHAKE PO) Take 1 Canister by mouth 3 (three) times daily. Boost/ensure    ? Probiotic Product (PROBIOTIC PO) Take 1 capsule by mouth daily.    ? propranolol (INDERAL) 20 MG tablet Take 20 mg by mouth 2 (two) times daily.    ? sertraline (ZOLOFT) 25 MG tablet Take 1 tablet (25 mg total) by mouth daily. 30 tablet 0  ? bisacodyl (DULCOLAX) 5 MG EC tablet Take 1 tablet (5 mg total) by mouth daily as needed for moderate constipation. 30 tablet 0  ? cholecalciferol (VITAMIN D) 25 MCG (1000 UNIT) tablet Take 3,000 Units by mouth daily.    ? Magnesium Hydroxide (MILK OF MAGNESIA PO) Take 30 mLs by mouth as needed.    ?  Sodium Phosphates (RA SALINE ENEMA RE) Place 1 Dose rectally as needed.    ? ?No facility-administered medications prior to visit.  ? ? ?PAST MEDICAL HISTORY: ?Past Medical History:  ?Diagnosis Date  ? Cerebrovascular disease 10/03/2020  ? ICH (intracerebral hemorrhage) (HCC) 11/01/2017  ? Right frontal  ? Memory change 10/03/2020  ? Seizures (HCC) 11/01/2017  ? ? ?PAST SURGICAL HISTORY: ?Past Surgical History:  ?Procedure Laterality Date  ? VAGINAL HYSTERECTOMY    ? ? ?FAMILY HISTORY: ?History reviewed. No pertinent family  history. ? ?SOCIAL HISTORY: ?Social History  ? ?Socioeconomic History  ? Marital status: Widowed  ?  Spouse name: Not on file  ? Number of children: Not on file  ? Years of education: 51  ? Highest education level: Not on file  ?Occupational History  ? Not on file  ?Tobacco Use  ? Smoking status: Former  ? Smokeless tobacco: Never  ?Vaping Use  ? Vaping Use: Never used  ?Substance and Sexual Activity  ? Alcohol use: Not Currently  ? Drug use: Never  ? Sexual activity: Not on file  ?Other Topics Concern  ? Not on file  ?Social History Narrative  ? Lives  ? Right handed  ? Caffeine use:   ? ?Social Determinants of Health  ? ?Financial Resource Strain: Not on file  ?Food Insecurity: Not on file  ?Transportation Needs: Not on file  ?Physical Activity: Not on file  ?Stress: Not on file  ?Social Connections: Not on file  ?Intimate Partner Violence: Not on file  ? ?PHYSICAL EXAM ? ?Vitals:  ? 04/23/21 1522  ?BP: (!) 155/70  ?Pulse: 64  ?Weight: 109 lb (49.4 kg)  ?Height: 5\' 2"  (1.575 m)  ? ?Body mass index is 19.94 kg/m?. ? ?Generalized: Well developed, in no acute distress  ?MMSE - Mini Mental State Exam 10/03/2020 04/02/2020  ?Orientation to time 0 4  ?Orientation to Place 1 5  ?Registration 3 3  ?Attention/ Calculation 1 1  ?Recall 0 0  ?Language- name 2 objects 2 2  ?Language- repeat 1 1  ?Language- follow 3 step command 3 3  ?Language- read & follow direction 1 1  ?Write a sentence 1 1  ?Copy design 0 0  ?Total score 13 21  ? ? ?Neurological examination  ?Mentation: Alert oriented to time, place, history mostly provided by family. Follows all commands speech and language fluent, is pleasant, smiling ?Cranial nerve II-XII: Pupils were equal round reactive to light. Difficult to assess visual fields, right hemianopia. Facial sensation and strength were normal. Head turning and shoulder shrug  were normal and symmetric. ?Motor: Good strength all extremities ?Sensory: Sensory testing is intact to soft touch on all 4  extremities. No evidence of extinction is noted.  ?Coordination: Cerebellar testing reveals good finger-nose-finger and heel-to-shin bilaterally.  ?Gait and station: Gait is wide-based, uses a walker ?Reflexes: Deep tendon reflexes are symmetric  ? ?DIAGNOSTIC DATA (LABS, IMAGING, TESTING) ?- I reviewed patient records, labs, notes, testing and imaging myself where available. ? ?Lab Results  ?Component Value Date  ? WBC 11.6 12/18/2020  ? HGB 10.8 (A) 12/18/2020  ? HCT 32 (A) 12/18/2020  ? MCV 92.3 12/06/2020  ? PLT 318 12/18/2020  ? ?   ?Component Value Date/Time  ? NA 143 12/18/2020 0000  ? K 4.3 12/18/2020 0000  ? CL 105 12/18/2020 0000  ? CO2 25 (A) 12/18/2020 0000  ? GLUCOSE 128 (H) 12/06/2020 12/08/2020  ? BUN 8 12/18/2020 0000  ? CREATININE 0.3 (  A) 12/18/2020 0000  ? CREATININE 0.58 12/06/2020 0832  ? CALCIUM 9.0 12/18/2020 0000  ? PROT 6.0 (L) 12/01/2020 0359  ? ALBUMIN 3.2 (L) 12/01/2020 0359  ? AST 22 12/01/2020 0359  ? ALT 16 12/01/2020 0359  ? ALKPHOS 62 12/01/2020 0359  ? BILITOT 0.7 12/01/2020 0359  ? GFRNONAA >60 12/06/2020 0832  ? ?Lab Results  ?Component Value Date  ? CHOL 121 01/28/2021  ? HDL 45 01/28/2021  ? LDLCALC 53 01/28/2021  ? TRIG 133 01/28/2021  ? CHOLHDL 2.7 01/28/2021  ? ?Lab Results  ?Component Value Date  ? HGBA1C 5.7 (H) 11/29/2020  ? ?No results found for: VITAMINB12 ?No results found for: TSH ? ? ?ASSESSMENT AND PLAN ?88 y.o. year old female  ? ?1.  Left PCA stroke ?-Continue aspirin 81 mg daily, managing vascular risk factors for secondary stroke prevention ?-Further cardiac work-up was not recommended due to history of advanced age, dementia, risk, history of ICH not a good candidate for anticoagulation ?-Referral again placed to ophthalmology for vision evaluation, right hemianopia ?-LDL 53 Dec 2022, on Lipitor  ? ?2.  History of seizures ?-Tapered off Keppra, no recurrent seizure events ? ?3.  Dementia, progressive ?-Continue Namenda, given a paper prescription to have filled with  good rx ?-Return here PRN basis, will have facility manage care going forward ? ?Margie Ege, AGNP-C, DNP 04/23/2021, 3:29 PM ?[facility] ?[address], [address] ?[place], [place] [address] ?([phone] ?

## 2021-04-23 NOTE — Patient Instructions (Signed)
Referral to eye doctor  ?Continue aspirin  ?Continue Namenda  ?See you back in 1 year  ?

## 2021-04-24 ENCOUNTER — Telehealth: Payer: Self-pay | Admitting: Neurology

## 2021-04-24 NOTE — Telephone Encounter (Signed)
Sent to Dr. Groat ph # [phone] 

## 2021-05-29 ENCOUNTER — Ambulatory Visit: Payer: Medicare Other | Admitting: Adult Health

## 2021-07-22 ENCOUNTER — Telehealth: Payer: Self-pay | Admitting: Neurology

## 2021-07-22 NOTE — Telephone Encounter (Signed)
I received ophthalmology evaluation from Dr. Katy Fitch 07/15/21, difficult exam was unreliable due to dementia. VF shows complete loss which is not accurate. OCT does not show any evidence of stroke.  Confrontation VF repeated several times, seems full.  Reevaluation in 1 year.  She did not complain about vision, but did report dryness and heavy sensation.

## 2021-11-16 DEATH — deceased
